# Patient Record
Sex: Female | Born: 1950 | Race: Black or African American | Hispanic: No | Marital: Single | Smoking: Former smoker
Health system: Southern US, Community
[De-identification: ages and names within clinical notes are randomized; demographics above are authoritative.]

## PROBLEM LIST (undated history)

## (undated) DIAGNOSIS — F419 Anxiety disorder, unspecified: Secondary | ICD-10-CM

## (undated) DIAGNOSIS — F329 Major depressive disorder, single episode, unspecified: Secondary | ICD-10-CM

## (undated) DIAGNOSIS — I10 Essential (primary) hypertension: Secondary | ICD-10-CM

## (undated) DIAGNOSIS — M1990 Unspecified osteoarthritis, unspecified site: Secondary | ICD-10-CM

## (undated) DIAGNOSIS — F32A Depression, unspecified: Secondary | ICD-10-CM

## (undated) DIAGNOSIS — C801 Malignant (primary) neoplasm, unspecified: Secondary | ICD-10-CM

## (undated) HISTORY — PX: TONSILLECTOMY: SUR1361

---

## 1997-11-23 ENCOUNTER — Emergency Department (HOSPITAL_COMMUNITY): Admission: EM | Admit: 1997-11-23 | Discharge: 1997-11-23 | Payer: Self-pay | Admitting: Emergency Medicine

## 1997-12-05 ENCOUNTER — Emergency Department (HOSPITAL_COMMUNITY): Admission: EM | Admit: 1997-12-05 | Discharge: 1997-12-05 | Payer: Self-pay | Admitting: Emergency Medicine

## 1998-04-16 ENCOUNTER — Encounter: Payer: Self-pay | Admitting: Emergency Medicine

## 1998-04-16 ENCOUNTER — Emergency Department (HOSPITAL_COMMUNITY): Admission: EM | Admit: 1998-04-16 | Discharge: 1998-04-16 | Payer: Self-pay | Admitting: Emergency Medicine

## 2000-03-11 ENCOUNTER — Inpatient Hospital Stay (HOSPITAL_COMMUNITY): Admission: EM | Admit: 2000-03-11 | Discharge: 2000-03-17 | Payer: Self-pay | Admitting: Psychiatry

## 2000-03-20 ENCOUNTER — Observation Stay: Admission: EM | Admit: 2000-03-20 | Discharge: 2000-03-21 | Payer: Self-pay | Admitting: Emergency Medicine

## 2001-01-08 ENCOUNTER — Other Ambulatory Visit: Admission: RE | Admit: 2001-01-08 | Discharge: 2001-01-08 | Payer: Self-pay | Admitting: Internal Medicine

## 2001-08-24 ENCOUNTER — Encounter: Payer: Self-pay | Admitting: Emergency Medicine

## 2001-08-24 ENCOUNTER — Emergency Department (HOSPITAL_COMMUNITY): Admission: EM | Admit: 2001-08-24 | Discharge: 2001-08-24 | Payer: Self-pay

## 2002-05-25 ENCOUNTER — Emergency Department (HOSPITAL_COMMUNITY): Admission: EM | Admit: 2002-05-25 | Discharge: 2002-05-25 | Payer: Self-pay | Admitting: Emergency Medicine

## 2002-10-19 ENCOUNTER — Ambulatory Visit (HOSPITAL_COMMUNITY): Admission: RE | Admit: 2002-10-19 | Discharge: 2002-10-19 | Payer: Self-pay | Admitting: Internal Medicine

## 2002-10-19 ENCOUNTER — Encounter: Payer: Self-pay | Admitting: Internal Medicine

## 2003-04-18 ENCOUNTER — Encounter: Payer: Self-pay | Admitting: Emergency Medicine

## 2003-04-18 ENCOUNTER — Emergency Department (HOSPITAL_COMMUNITY): Admission: EM | Admit: 2003-04-18 | Discharge: 2003-04-18 | Payer: Self-pay | Admitting: Emergency Medicine

## 2003-09-19 ENCOUNTER — Ambulatory Visit (HOSPITAL_COMMUNITY): Admission: RE | Admit: 2003-09-19 | Discharge: 2003-09-19 | Payer: Self-pay | Admitting: Family Medicine

## 2004-04-07 ENCOUNTER — Ambulatory Visit: Payer: Self-pay | Admitting: Nurse Practitioner

## 2004-04-15 ENCOUNTER — Ambulatory Visit: Payer: Self-pay | Admitting: *Deleted

## 2004-05-18 ENCOUNTER — Ambulatory Visit: Payer: Self-pay | Admitting: Nurse Practitioner

## 2004-08-23 ENCOUNTER — Ambulatory Visit: Payer: Self-pay | Admitting: Family Medicine

## 2004-08-31 ENCOUNTER — Emergency Department (HOSPITAL_COMMUNITY): Admission: EM | Admit: 2004-08-31 | Discharge: 2004-08-31 | Payer: Self-pay | Admitting: Emergency Medicine

## 2004-10-14 ENCOUNTER — Ambulatory Visit: Payer: Self-pay | Admitting: Nurse Practitioner

## 2005-02-02 ENCOUNTER — Ambulatory Visit: Payer: Self-pay | Admitting: Nurse Practitioner

## 2005-02-04 ENCOUNTER — Ambulatory Visit: Payer: Self-pay | Admitting: Nurse Practitioner

## 2005-03-10 ENCOUNTER — Other Ambulatory Visit: Admission: RE | Admit: 2005-03-10 | Discharge: 2005-03-10 | Payer: Self-pay | Admitting: Family Medicine

## 2005-03-10 ENCOUNTER — Ambulatory Visit: Payer: Self-pay | Admitting: Family Medicine

## 2005-03-10 ENCOUNTER — Encounter (INDEPENDENT_AMBULATORY_CARE_PROVIDER_SITE_OTHER): Payer: Self-pay | Admitting: *Deleted

## 2005-03-11 ENCOUNTER — Ambulatory Visit: Payer: Self-pay | Admitting: Nurse Practitioner

## 2006-01-30 ENCOUNTER — Ambulatory Visit: Payer: Self-pay | Admitting: Nurse Practitioner

## 2006-09-15 ENCOUNTER — Emergency Department (HOSPITAL_COMMUNITY): Admission: EM | Admit: 2006-09-15 | Discharge: 2006-09-15 | Payer: Self-pay | Admitting: Emergency Medicine

## 2006-11-23 ENCOUNTER — Ambulatory Visit: Payer: Self-pay | Admitting: Nurse Practitioner

## 2007-03-28 ENCOUNTER — Encounter (INDEPENDENT_AMBULATORY_CARE_PROVIDER_SITE_OTHER): Payer: Self-pay | Admitting: *Deleted

## 2007-08-03 ENCOUNTER — Ambulatory Visit: Payer: Self-pay | Admitting: Family Medicine

## 2007-08-06 ENCOUNTER — Ambulatory Visit: Payer: Self-pay | Admitting: Internal Medicine

## 2007-09-11 ENCOUNTER — Ambulatory Visit: Payer: Self-pay | Admitting: Internal Medicine

## 2007-09-11 LAB — CONVERTED CEMR LAB
Anti Nuclear Antibody(ANA): NEGATIVE
BUN: 14 mg/dL (ref 6–23)
Basophils Absolute: 0.1 10*3/uL (ref 0.0–0.1)
CO2: 25 meq/L (ref 19–32)
Chloride: 102 meq/L (ref 96–112)
Lymphocytes Relative: 33 % (ref 12–46)
Lymphs Abs: 3.4 10*3/uL (ref 0.7–4.0)
MCV: 79.6 fL (ref 78.0–100.0)
Monocytes Absolute: 0.9 10*3/uL (ref 0.1–1.0)
Neutro Abs: 5.6 10*3/uL (ref 1.7–7.7)
Potassium: 4.3 meq/L (ref 3.5–5.3)
RDW: 15.3 % (ref 11.5–15.5)

## 2007-10-26 ENCOUNTER — Ambulatory Visit: Payer: Self-pay | Admitting: Internal Medicine

## 2007-11-23 ENCOUNTER — Ambulatory Visit: Payer: Self-pay | Admitting: Sports Medicine

## 2007-11-23 DIAGNOSIS — IMO0002 Reserved for concepts with insufficient information to code with codable children: Secondary | ICD-10-CM | POA: Insufficient documentation

## 2007-11-23 DIAGNOSIS — M1710 Unilateral primary osteoarthritis, unspecified knee: Secondary | ICD-10-CM | POA: Insufficient documentation

## 2007-11-25 DIAGNOSIS — F329 Major depressive disorder, single episode, unspecified: Secondary | ICD-10-CM | POA: Insufficient documentation

## 2007-11-25 DIAGNOSIS — F411 Generalized anxiety disorder: Secondary | ICD-10-CM | POA: Insufficient documentation

## 2007-11-25 DIAGNOSIS — I10 Essential (primary) hypertension: Secondary | ICD-10-CM

## 2007-11-25 DIAGNOSIS — M1990 Unspecified osteoarthritis, unspecified site: Secondary | ICD-10-CM | POA: Insufficient documentation

## 2008-03-04 ENCOUNTER — Ambulatory Visit: Payer: Self-pay | Admitting: Internal Medicine

## 2008-06-26 ENCOUNTER — Ambulatory Visit: Payer: Self-pay | Admitting: Internal Medicine

## 2008-06-26 ENCOUNTER — Encounter: Payer: Self-pay | Admitting: Internal Medicine

## 2008-06-26 LAB — CONVERTED CEMR LAB: Chlamydia, DNA Probe: NEGATIVE

## 2008-07-07 ENCOUNTER — Ambulatory Visit: Payer: Self-pay | Admitting: Internal Medicine

## 2008-07-07 ENCOUNTER — Ambulatory Visit (HOSPITAL_COMMUNITY): Admission: RE | Admit: 2008-07-07 | Discharge: 2008-07-07 | Payer: Self-pay | Admitting: Family Medicine

## 2008-07-18 ENCOUNTER — Encounter: Admission: RE | Admit: 2008-07-18 | Discharge: 2008-07-18 | Payer: Self-pay | Admitting: Internal Medicine

## 2008-12-11 ENCOUNTER — Ambulatory Visit: Payer: Self-pay | Admitting: Internal Medicine

## 2008-12-11 ENCOUNTER — Encounter: Payer: Self-pay | Admitting: Internal Medicine

## 2009-01-13 ENCOUNTER — Ambulatory Visit (HOSPITAL_COMMUNITY): Admission: RE | Admit: 2009-01-13 | Discharge: 2009-01-13 | Payer: Self-pay | Admitting: Internal Medicine

## 2009-02-12 ENCOUNTER — Other Ambulatory Visit: Admission: RE | Admit: 2009-02-12 | Discharge: 2009-02-12 | Payer: Self-pay | Admitting: Obstetrics and Gynecology

## 2009-02-12 ENCOUNTER — Ambulatory Visit: Payer: Self-pay | Admitting: Obstetrics & Gynecology

## 2009-02-26 ENCOUNTER — Ambulatory Visit: Payer: Self-pay | Admitting: Family Medicine

## 2010-02-26 ENCOUNTER — Ambulatory Visit: Payer: Self-pay | Admitting: Internal Medicine

## 2010-04-15 ENCOUNTER — Encounter (INDEPENDENT_AMBULATORY_CARE_PROVIDER_SITE_OTHER): Payer: Self-pay | Admitting: Internal Medicine

## 2010-04-15 LAB — CONVERTED CEMR LAB
Chloride: 107 meq/L (ref 96–112)
Cholesterol: 156 mg/dL (ref 0–200)
Glucose, Bld: 96 mg/dL (ref 70–99)
Potassium: 5.1 meq/L (ref 3.5–5.3)
Sodium: 142 meq/L (ref 135–145)
Total CHOL/HDL Ratio: 1.6
Triglycerides: 53 mg/dL (ref <150)
VLDL: 11 mg/dL (ref 0–40)

## 2010-09-08 ENCOUNTER — Other Ambulatory Visit: Payer: Self-pay | Admitting: Internal Medicine

## 2010-10-08 ENCOUNTER — Other Ambulatory Visit: Payer: Self-pay | Admitting: Internal Medicine

## 2010-10-08 NOTE — Telephone Encounter (Signed)
Refill refused because [facility] is not an [facility] patient; she is followed at [facility]

## 2010-11-18 ENCOUNTER — Other Ambulatory Visit: Payer: Self-pay | Admitting: Internal Medicine

## 2010-11-26 NOTE — Discharge Summary (Signed)
[facility]  Patient:    PARISSA, CHIAO                     MRN: [id number] Adm. Date:  [id number] Disc. Date: [id number] Attending:  Julian Hy                           Discharge Summary  DISCHARGE DIAGNOSES: 1. Mixed overdose with alcohol and Wellbutrin. 2. Increased seizure risk due to Wellbutrin overdose, no seizures noted. 3. Ongoing alcohol abuse. 4. Major depression. 5. Labile blood pressure without specific therapy needed.  HISTORY:  Ms. Senaida Ores is a 60 year old black female who presented on March 20, 2000, after an overdose with Wellbutrin and alcohol.  She had taken approximately five Wellbutrin SR 150, and it was felt that her seizure risk was increased.  Based on this, she was admitted to the medical service unassigned for observation.  She has had no problems with seizures or seizure-like activity during her hospitalization.  She was seen in consultation by psychiatry and transferred to [facility] and has been medically stable overnight.  She has had labile blood pressure initially with it up, but it has settled down without specific therapy.  She has had no signs of alcohol withdrawal or withdrawal from other substances.  It is felt best by the psychiatry consultants that she can go home with outpatient therapy.  I will follow their lead on these issues.  LABORATORY REVIEW:  Urinalysis is negative.  Hepatic profile is entirely negative except for an SGPT of 42.  INR was normal at 1.3.  Alcohol level was 117 mg/dl.  At presentation, sodium 144, potassium 4.3, chloride 112, CO2 25, BUN 13, creatinine 1.1, calcium 9.0, glucose 80, white count 7700, hemoglobin 13.1, platelet count 263,000.  Tylenol level was less than 10.  Drug screen was positive for benzodiazepines and THC.  Blood pressure has settled down to 150/90.  She has had no fever while here.  In summary, a 60 year old black female with an overdose, as noted  above, superimposed upon a history of depression.  The patient is stabilized and is past any seizure risk.  We will look towards the consultants to the best plan of action for her.  She is going to be seen by them later today and if cleared, will be discharged to home with them to give prescriptions for the Wellbutrin.  The patient has no specific medical issues that need follow-up, assuming that she gets outpatient treatment for alcohol abuse.  She has been seen in [facility] for general medical issues, and I recommend follow-up with them in the future.  She does not have any evidence of any other ongoing medical issues. DD:  03/21/00 TD:  03/21/00 Job: [id number]
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
## 2010-11-26 NOTE — H&P (Signed)
[facility]  Patient:    Kristen Chavez, Kristen Chavez                     MRN: [id number] Adm. Date:  [id number] Attending:  Benny Lennert                         History and Physical  ADDENDUM:  PAST MEDICAL HISTORY:  History of high blood pressure, previously treated; a history of depression.  No surgical history.  No hospitalizations.  No head injuries or history of seizures.  No history of coronary disease, diabetes, breathing trouble or other problems. DD:  03/20/00 TD:  03/20/00 Job: [id number]
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
## 2011-01-10 ENCOUNTER — Other Ambulatory Visit: Payer: Self-pay | Admitting: Internal Medicine

## 2011-01-13 ENCOUNTER — Other Ambulatory Visit: Payer: Self-pay | Admitting: Internal Medicine

## 2011-04-04 ENCOUNTER — Other Ambulatory Visit: Payer: Self-pay | Admitting: Internal Medicine

## 2011-06-10 ENCOUNTER — Other Ambulatory Visit: Payer: Self-pay | Admitting: Internal Medicine

## 2011-06-10 NOTE — Telephone Encounter (Signed)
Not our patient

## 2011-06-13 ENCOUNTER — Other Ambulatory Visit: Payer: Self-pay | Admitting: Internal Medicine

## 2012-09-21 ENCOUNTER — Encounter (HOSPITAL_COMMUNITY): Payer: Self-pay

## 2012-09-21 ENCOUNTER — Emergency Department (INDEPENDENT_AMBULATORY_CARE_PROVIDER_SITE_OTHER)
Admission: EM | Admit: 2012-09-21 | Discharge: 2012-09-21 | Disposition: A | Discharge status: Home / Self Care | Payer: Self-pay | Source: Home / Self Care

## 2012-09-21 DIAGNOSIS — F411 Generalized anxiety disorder: Secondary | ICD-10-CM

## 2012-09-21 DIAGNOSIS — I10 Essential (primary) hypertension: Secondary | ICD-10-CM

## 2012-09-21 MED ORDER — HYDROXYZINE HCL 25 MG PO TABS: 25.0000 mg | ORAL_TABLET | Freq: Three times a day (TID) | ORAL | Status: DC | PRN

## 2012-09-21 MED ORDER — LISINOPRIL-HYDROCHLOROTHIAZIDE 20-25 MG PO TABS: 1.0000 | ORAL_TABLET | Freq: Every day | ORAL | Status: DC

## 2012-09-21 MED ORDER — HYDROCODONE-ACETAMINOPHEN 5-500 MG PO TABS: 1.0000 | ORAL_TABLET | Freq: Four times a day (QID) | ORAL | Status: DC | PRN

## 2012-09-21 NOTE — ED Provider Notes (Signed)
History     CSN: [id number]  Arrival date & time 09/21/12  1448   None     Chief Complaint  Patient presents with  . Hypertension    (Consider location/radiation/quality/duration/timing/severity/associated sxs/prior treatment) HPI Patient is 62 year old female who presents for followup and would like to get a refill on medicines. She reports being in usual state of health, no chest pain or shortness of breath, no abdominal or urinary concerns, no recent sicknesses or hospitalizations. Patient reports being healthy otherwise but has chronic pain from osteoarthritis in her knees. 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  No past medical history   Family history of HTN  History  Substance Use Topics  . Smoking status: Not on file  . Smokeless tobacco: Not on file  . Alcohol Use: Not on file    OB History   No data available      Review of Systems  Constitutional: Negative for fever, chills, diaphoresis, activity change, appetite change and fatigue.  HENT: Negative for ear pain, nosebleeds, congestion, facial swelling, rhinorrhea, neck pain, neck stiffness and ear discharge.   Eyes: Negative for pain, discharge, redness, itching and visual disturbance.  Respiratory: Negative for cough, choking, chest tightness, shortness of breath, wheezing and stridor.   Cardiovascular: Negative for chest pain, palpitations and leg swelling.  Gastrointestinal: Negative for abdominal distention.  Genitourinary: Negative for dysuria, urgency, frequency, hematuria, flank pain, decreased urine volume, difficulty urinating and dyspareunia.  Musculoskeletal: Negative for back pain, joint swelling, arthralgias and gait problem.  Neurological: Negative for dizziness, tremors, seizures, syncope, facial asymmetry, speech difficulty, weakness, light-headedness, numbness and headaches.  Hematological: Negative for adenopathy. Does not bruise/bleed  easily.  Psychiatric/Behavioral: Negative for hallucinations, behavioral problems, confusion, dysphoric mood, decreased concentration and agitation.    Allergies  Review of patient's allergies indicates not on file.  Home Medications  No current outpatient prescriptions on file.  There were no vitals taken for this visit.  Physical Exam Physical Exam  Constitutional: Appears well-developed and well-nourished. No distress.  HENT: Normocephalic. External right and left ear normal. Oropharynx is clear and moist.  Eyes: Conjunctivae and EOM are normal. PERRLA, no scleral icterus.  Neck: Normal ROM. Neck supple. No JVD. No tracheal deviation. No thyromegaly.  CVS: RRR, S1/S2 +, no murmurs, no gallops, no carotid bruit.  Pulmonary: Effort and breath sounds normal, no stridor, rhonchi, wheezes, rales.  Abdominal: Soft. BS +,  no distension, tenderness, rebound or guarding.  Musculoskeletal: Normal range of motion. No edema and no tenderness.  Lymphadenopathy: No lymphadenopathy noted, cervical, inguinal. Neuro: Alert. Normal reflexes, muscle tone coordination. No cranial nerve deficit. Skin: Skin is warm and dry. No rash noted. Not diaphoretic. No erythema. No pallor.  Psychiatric: Normal mood and affect. Behavior, judgment, thought content normal.    ED Course  Procedures (including critical care time)  Labs Reviewed - No data to display No results found.   Hypertension - Somewhat above the target range, we have discussed normal blood pressure range - I have advised patient to check blood pressure regularly at home and to call his back if the numbers are persistently higher than 140/90 - I will go ahead and prescribe lisinopril/HCTZ with no change in the dose as of right now Arthritis - In bilateral knees, continue pain medicine as needed   MDM  Hypertension        Dorothea Ogle, MD 09/21/12 1549

## 2012-09-21 NOTE — ED Notes (Signed)
Patient states had her blood pressure checked at [facility]  And was encouraged to come here Because her B/P 180/130

## 2013-07-19 ENCOUNTER — Encounter (HOSPITAL_COMMUNITY): Payer: Self-pay | Admitting: Emergency Medicine

## 2013-07-19 ENCOUNTER — Emergency Department (HOSPITAL_COMMUNITY)
Admission: EM | Admit: 2013-07-19 | Discharge: 2013-07-19 | Disposition: A | Discharge status: Home / Self Care | Payer: Self-pay | Attending: Emergency Medicine | Admitting: Emergency Medicine

## 2013-07-19 ENCOUNTER — Emergency Department (HOSPITAL_COMMUNITY): Payer: Self-pay

## 2013-07-19 DIAGNOSIS — Z91199 Patient's noncompliance with other medical treatment and regimen due to unspecified reason: Secondary | ICD-10-CM | POA: Insufficient documentation

## 2013-07-19 DIAGNOSIS — M25529 Pain in unspecified elbow: Secondary | ICD-10-CM | POA: Insufficient documentation

## 2013-07-19 DIAGNOSIS — M542 Cervicalgia: Secondary | ICD-10-CM | POA: Insufficient documentation

## 2013-07-19 DIAGNOSIS — R079 Chest pain, unspecified: Secondary | ICD-10-CM | POA: Insufficient documentation

## 2013-07-19 DIAGNOSIS — Z79899 Other long term (current) drug therapy: Secondary | ICD-10-CM | POA: Insufficient documentation

## 2013-07-19 DIAGNOSIS — R002 Palpitations: Secondary | ICD-10-CM | POA: Insufficient documentation

## 2013-07-19 DIAGNOSIS — Z760 Encounter for issue of repeat prescription: Secondary | ICD-10-CM | POA: Insufficient documentation

## 2013-07-19 DIAGNOSIS — R0609 Other forms of dyspnea: Secondary | ICD-10-CM | POA: Insufficient documentation

## 2013-07-19 DIAGNOSIS — F17200 Nicotine dependence, unspecified, uncomplicated: Secondary | ICD-10-CM | POA: Insufficient documentation

## 2013-07-19 DIAGNOSIS — R0989 Other specified symptoms and signs involving the circulatory and respiratory systems: Secondary | ICD-10-CM | POA: Insufficient documentation

## 2013-07-19 DIAGNOSIS — Z789 Other specified health status: Secondary | ICD-10-CM

## 2013-07-19 DIAGNOSIS — Z9119 Patient's noncompliance with other medical treatment and regimen: Secondary | ICD-10-CM | POA: Insufficient documentation

## 2013-07-19 DIAGNOSIS — I10 Essential (primary) hypertension: Secondary | ICD-10-CM | POA: Insufficient documentation

## 2013-07-19 HISTORY — DX: Essential (primary) hypertension: I10

## 2013-07-19 LAB — BASIC METABOLIC PANEL
BUN: 11 mg/dL (ref 6–23)
CHLORIDE: 103 meq/L (ref 96–112)
CO2: 25 meq/L (ref 19–32)
Calcium: 9.5 mg/dL (ref 8.4–10.5)
Creatinine, Ser: 0.74 mg/dL (ref 0.50–1.10)
GFR calc Af Amer: >90 mL/min (ref >90)
GFR calc non Af Amer: 88 mL/min — ABNORMAL LOW (ref >90)
GLUCOSE: 84 mg/dL (ref 70–99)
POTASSIUM: 3.4 meq/L — AB (ref 3.7–5.3)
SODIUM: 139 meq/L (ref 137–147)

## 2013-07-19 LAB — POCT I-STAT TROPONIN I
Troponin i, poc: 0 ng/mL (ref 0.00–0.08)
Troponin i, poc: 0 ng/mL (ref 0.00–0.08)

## 2013-07-19 LAB — CBC
HEMATOCRIT: 39.2 % (ref 36.0–46.0)
HEMOGLOBIN: 13.3 g/dL (ref 12.0–15.0)
MCH: 27.5 pg (ref 26.0–34.0)
MCHC: 33.9 g/dL (ref 30.0–36.0)
MCV: 81.2 fL (ref 78.0–100.0)
Platelets: 286 10*3/uL (ref 150–400)
RBC: 4.83 MIL/uL (ref 3.87–5.11)
RDW: 15.7 % — ABNORMAL HIGH (ref 11.5–15.5)
WBC: 7.4 10*3/uL (ref 4.0–10.5)

## 2013-07-19 MED ORDER — METOPROLOL TARTRATE 1 MG/ML IV SOLN
5.0000 mg | Freq: Once | INTRAVENOUS | Status: AC
  Administered 2013-07-19: 5 mg via INTRAVENOUS
  Filled 2013-07-19: qty 5

## 2013-07-19 MED ORDER — ASPIRIN 81 MG PO CHEW
324.0000 mg | CHEWABLE_TABLET | Freq: Once | ORAL | Status: AC
  Administered 2013-07-19: 324 mg via ORAL
  Filled 2013-07-19: qty 4

## 2013-07-19 MED ORDER — CITALOPRAM HYDROBROMIDE 40 MG PO TABS: 40.0000 mg | ORAL_TABLET | Freq: Every day | ORAL | Status: DC

## 2013-07-19 MED ORDER — LISINOPRIL-HYDROCHLOROTHIAZIDE 20-25 MG PO TABS: 1.0000 | ORAL_TABLET | Freq: Every day | ORAL | Status: DC

## 2013-07-19 NOTE — ED Provider Notes (Addendum)
CSN: [id number]     Arrival date & time 07/19/13  1054 History   First MD Initiated Contact with Patient 07/19/13 1116     Chief Complaint  Patient presents with  . Chest Pain  . Hypertension   (Consider location/radiation/quality/duration/timing/severity/associated sxs/prior Treatment) HPI 63 y.o. Female with history of hypertension and medication noncompliance presents with hypertension and chest pain for a week.  She states she has been getting chest pain for a week brought on by stress with palpitations, dyspnea, pain shooting in left elbow, neck and sometimes thumping and sharp left anterior chest pain lasting minutes.  Resolves on own occurring a couple of times per day.  States similar symptoms several years ago for which she was not evaluated and resolved on own.  She does not have a pmd and hasn't been taking antihypertensives for a couple of months.  No family history of heart disease.  Past Medical History  Diagnosis Date  . Hypertension    Past Surgical History  Procedure Laterality Date  . Tonsillectomy     No family history on file. History  Substance Use Topics  . Smoking status: Current Every Day Smoker  . Smokeless tobacco: Not on file  . Alcohol Use: Yes   OB History   Grav Para Term Preterm Abortions TAB SAB Ect Mult Living                 Review of Systems  Allergies  Review of patient's allergies indicates no known allergies.  Home Medications   Current Outpatient Rx  Name  Route  Sig  Dispense  Refill  . citalopram (CELEXA) 40 MG tablet   Oral   Take 40 mg by mouth daily.         Marland Kitchen HYDROcodone-acetaminophen (VICODIN) 5-500 MG per tablet   Oral   Take 1 tablet by mouth every 6 (six) hours as needed for pain.   65 tablet   5   . hydrOXYzine (ATARAX/VISTARIL) 25 MG tablet   Oral   Take 1 tablet (25 mg total) by mouth 3 (three) times daily as needed for itching.   90 tablet   5   . lisinopril-hydrochlorothiazide (PRINZIDE,ZESTORETIC) 20-25 MG  per tablet   Oral   Take 1 tablet by mouth daily.   30 tablet   5    BP 176/113  Pulse 76  Temp(Src) 97.9 F (36.6 C) (Oral)  Resp 16  Ht 5\' 5"  (1.651 m)  Wt 160 lb (72.576 kg)  BMI 26.63 kg/m2  SpO2 98% Physical Exam  Nursing note and vitals reviewed. Constitutional: She is oriented to person, place, and time. She appears well-developed and well-nourished.  HENT:  Head: Normocephalic and atraumatic.  Right Ear: External ear normal.  Left Ear: External ear normal.  Nose: Nose normal.  Mouth/Throat: Oropharynx is clear and moist.  Eyes: Conjunctivae and EOM are normal. Pupils are equal, round, and reactive to light.  Neck: Normal range of motion. Neck supple.  Cardiovascular: Normal rate, regular rhythm, normal heart sounds and intact distal pulses.   Pulmonary/Chest: Effort normal and breath sounds normal.  Abdominal: Soft. Bowel sounds are normal.  Musculoskeletal: Normal range of motion.  Neurological: She is alert and oriented to person, place, and time. She has normal reflexes.  Skin: Skin is warm and dry.  Psychiatric: She has a normal mood and affect. Her behavior is normal. Thought content normal.    ED Course  Procedures (including critical care time) Labs Review Labs Reviewed  CBC  BASIC METABOLIC PANEL  PRO B NATRIURETIC PEPTIDE   Imaging Review No results found.  EKG Interpretation    Date/Time:  Friday July 19 2013 11:00:19 EST Ventricular Rate:  74 PR Interval:  142 QRS Duration: 78 QT Interval:  378 QTC Calculation: 419 R Axis:   33 Text Interpretation:  Normal sinus rhythm Possible Anterior infarct , age undetermined Abnormal ECG nonspecific inferior t wave abnormality Reconfirmed by Kmya Placide MD, Consuela Widener ([id number]) on 07/19/2013 2:45:39 PM             MDM  No diagnosis found.  Patient presents with chest palpitations and hypertension secondary to noncompliance with her medications in the setting of depression and  anxiety. I do not think  it is likely that this is anginal type chest pain but she did have 2 point-of-care troponins that were both negative. Her EKG does not show anything that looks ischemic here. She has been seen by case management and had primary care followup to assist her with obtaining her medications we will fill her antihypertensive and Celexa for 30 days with refills. I discussed the findings with the patient and discussed return precautions and she voices understanding.  Patient's blood pressure decreased to systolic of 366 after IV Lopressor here. Filed Vitals:   07/19/13 1245 07/19/13 1314 07/19/13 1315 07/19/13 1350  BP: 153/104 176/94 176/94 163/91  Pulse: 55  57 68  Temp:  97.9 F (36.6 C)    TempSrc:  Oral    Resp: 14 15 17    Height:      Weight:      SpO2: 100% 100% 100% 98%    Shaune Pollack, MD 07/19/13 [place], MD 07/19/13 [phone]

## 2013-07-19 NOTE — Progress Notes (Addendum)
ED CM  Received referral from [place] on POD E regarding medication assistance and f/u care with a  PCP. Pt presented to [facility] ED with CP and hypertension. In to speak with patient. She states, she has ran out of medications and does not have a PCP. Referral has been made to [facility] to establish care has an appointment 08/26/13 for f/u. Pt needs prescriptions until f/u with PCP. Discussed with Dr. Jeanell Sparrow. A 30 day supply was written for blood pressure meds and anti-depressants. These meds are on the $4 list at [facility]. Pt states, she is able to afford the prescriptions. Pt verbalizes understanding and agrees with discharge plan teach back method used. No further ED CM needs identified.

## 2013-07-19 NOTE — ED Notes (Signed)
Pt c/o left sided chest pain with shortness of breath x 1 week. Pt went to see a Dr today and her Blood pressure was too high so she was sent to ED. Pt has BP readings of 196/122 and 192/120 while at Dr office. Pt with labored breathing, talking in short worded sentences.

## 2013-07-19 NOTE — Discharge Instructions (Signed)
Please take medications as prescribed. Return if you have any change in your chest symptoms especially pain, sweating, or weakness. These followup as arranged.

## 2013-07-19 NOTE — ED Notes (Signed)
Contacted Mariann Laster with social work about pt not being able to afford RX.

## 2013-07-19 NOTE — Discharge Planning (Signed)
Partnership for [facility] Liaison  Follow up appointment was made with the [place] center for 08/26/13 at 3:15pm to establish primary care. Patient will also be obtaining the orange card at this visit. Patient was given the application and my contact information for any questions or concerns.

## 2013-07-19 NOTE — ED Notes (Signed)
Called lab to cancel BNP per MD Ray verbal order

## 2013-08-26 ENCOUNTER — Ambulatory Visit: Payer: Self-pay

## 2013-08-26 ENCOUNTER — Ambulatory Visit: Payer: Self-pay | Attending: Internal Medicine | Admitting: Internal Medicine

## 2013-08-26 ENCOUNTER — Encounter: Payer: Self-pay | Admitting: Internal Medicine

## 2013-08-26 VITALS — BP 134/92 | HR 56 | Temp 97.8°F | Resp 17 | Wt 160.6 lb

## 2013-08-26 DIAGNOSIS — M25562 Pain in left knee: Secondary | ICD-10-CM

## 2013-08-26 DIAGNOSIS — Z1211 Encounter for screening for malignant neoplasm of colon: Secondary | ICD-10-CM

## 2013-08-26 DIAGNOSIS — F17200 Nicotine dependence, unspecified, uncomplicated: Secondary | ICD-10-CM

## 2013-08-26 DIAGNOSIS — M25569 Pain in unspecified knee: Secondary | ICD-10-CM

## 2013-08-26 DIAGNOSIS — M25561 Pain in right knee: Secondary | ICD-10-CM

## 2013-08-26 DIAGNOSIS — Z139 Encounter for screening, unspecified: Secondary | ICD-10-CM

## 2013-08-26 DIAGNOSIS — F3289 Other specified depressive episodes: Secondary | ICD-10-CM

## 2013-08-26 DIAGNOSIS — Z23 Encounter for immunization: Secondary | ICD-10-CM

## 2013-08-26 DIAGNOSIS — I10 Essential (primary) hypertension: Secondary | ICD-10-CM

## 2013-08-26 DIAGNOSIS — F329 Major depressive disorder, single episode, unspecified: Secondary | ICD-10-CM

## 2013-08-26 LAB — CBC WITH DIFFERENTIAL/PLATELET
BASOS PCT: 1 % (ref 0–1)
Basophils Absolute: 0.1 10*3/uL (ref 0.0–0.1)
EOS ABS: 0.1 10*3/uL (ref 0.0–0.7)
Eosinophils Relative: 2 % (ref 0–5)
HEMATOCRIT: 38 % (ref 36.0–46.0)
HEMOGLOBIN: 12.6 g/dL (ref 12.0–15.0)
LYMPHS ABS: 2.4 10*3/uL (ref 0.7–4.0)
Lymphocytes Relative: 35 % (ref 12–46)
MCH: 26.2 pg (ref 26.0–34.0)
MCHC: 33.2 g/dL (ref 30.0–36.0)
MCV: 79 fL (ref 78.0–100.0)
MONO ABS: 0.6 10*3/uL (ref 0.1–1.0)
MONOS PCT: 8 % (ref 3–12)
Neutro Abs: 3.7 10*3/uL (ref 1.7–7.7)
Neutrophils Relative %: 54 % (ref 43–77)
Platelets: 288 10*3/uL (ref 150–400)
RBC: 4.81 MIL/uL (ref 3.87–5.11)
RDW: 15.2 % (ref 11.5–15.5)
WBC: 6.9 10*3/uL (ref 4.0–10.5)

## 2013-08-26 LAB — COMPLETE METABOLIC PANEL WITH GFR
ALBUMIN: 4.3 g/dL (ref 3.5–5.2)
ALK PHOS: 77 U/L (ref 39–117)
ALT: 16 U/L (ref 0–35)
AST: 18 U/L (ref 0–37)
BUN: 15 mg/dL (ref 6–23)
CO2: 28 meq/L (ref 19–32)
Calcium: 9.7 mg/dL (ref 8.4–10.5)
Chloride: 102 mEq/L (ref 96–112)
Creat: 0.77 mg/dL (ref 0.50–1.10)
GFR, Est Non African American: 82 mL/min
Glucose, Bld: 96 mg/dL (ref 70–99)
Potassium: 3.8 mEq/L (ref 3.5–5.3)
Sodium: 140 mEq/L (ref 135–145)
Total Bilirubin: 0.3 mg/dL (ref 0.2–1.2)
Total Protein: 7.3 g/dL (ref 6.0–8.3)

## 2013-08-26 LAB — LIPID PANEL
Cholesterol: 163 mg/dL (ref 0–200)
HDL: 76 mg/dL (ref >39)
LDL CALC: 70 mg/dL (ref 0–99)
Total CHOL/HDL Ratio: 2.1 Ratio
Triglycerides: 84 mg/dL (ref <150)
VLDL: 17 mg/dL (ref 0–40)

## 2013-08-26 LAB — TSH: TSH: 0.463 u[IU]/mL (ref 0.350–4.500)

## 2013-08-26 NOTE — Progress Notes (Signed)
Patient here for follow up _HTN

## 2013-08-26 NOTE — Progress Notes (Signed)
Patient Demographics  Kristen Chavez, is a 63 y.o. female  QQP:[id number]  [id number]  DOB - 11-Nov-1950  CC:  Chief Complaint  Patient presents with  . Establish Care       HPI: Kristen Chavez is a 63 y.o. female here today to establish medical care. Patient has history of hypertension osteoarthritis anxiety/depression, comes today to establish medical care denies any acute symptoms, has chronic knee joint pain and takes pain medication when necessary. Patient has No headache, No chest pain, No abdominal pain - No Nausea, No new weakness tingling or numbness, No Cough - SOB.  No Known Allergies Past Medical History  Diagnosis Date  . Hypertension    Current Outpatient Prescriptions on File Prior to Visit  Medication Sig Dispense Refill  . citalopram (CELEXA) 40 MG tablet Take 1 tablet (40 mg total) by mouth daily.  30 tablet  2  . lisinopril-hydrochlorothiazide (PRINZIDE,ZESTORETIC) 20-25 MG per tablet Take 1 tablet by mouth daily.  30 tablet  5  . Multiple Vitamin (MULTIVITAMIN WITH MINERALS) TABS tablet Take 1 tablet by mouth daily.       No current facility-administered medications on file prior to visit.   Family History  Problem Relation Age of Onset  . Hypertension Mother   . Diabetes Paternal Grandmother   . Diabetes Paternal Grandfather    History   Social History  . Marital Status: Single    Spouse Name: N/A    Number of Children: N/A  . Years of Education: N/A   Occupational History  . Not on file.   Social History Main Topics  . Smoking status: Current Every Day Smoker  . Smokeless tobacco: Not on file  . Alcohol Use: Yes     Comment: socially   . Drug Use: No  . Sexual Activity: Not on file   Other Topics Concern  . Not on file   Social History Narrative  . No narrative on file    Review of Systems: Constitutional: Negative for fever, chills, diaphoresis, activity change, appetite change and fatigue. HENT: Negative for ear pain,  nosebleeds, congestion, facial swelling, rhinorrhea, neck pain, neck stiffness and ear discharge.  Eyes: Negative for pain, discharge, redness, itching and visual disturbance. Respiratory: Negative for cough, choking, chest tightness, shortness of breath, wheezing and stridor.  Cardiovascular: Negative for chest pain, palpitations and leg swelling. Gastrointestinal: Negative for abdominal distention. Genitourinary: Negative for dysuria, urgency, frequency, hematuria, flank pain, decreased urine volume, difficulty urinating and dyspareunia.  Musculoskeletal: Negative for back pain, joint swelling, +arthralgia and gait problem. Neurological: Negative for dizziness, tremors, seizures, syncope, facial asymmetry, speech difficulty, weakness, light-headedness, numbness and headaches.  Hematological: Negative for adenopathy. Does not bruise/bleed easily. Psychiatric/Behavioral: Negative for hallucinations, behavioral problems, confusion, dysphoric mood, decreased concentration and agitation.    Objective:   Filed Vitals:   08/26/13 1145  BP: 134/92  Pulse: 56  Temp: 97.8 F (36.6 C)  Resp: 17    Physical Exam: Constitutional: Patient appears well-developed and well-nourished. No distress. HENT: Normocephalic, atraumatic, External right and left ear normal. Oropharynx is clear and moist.  Eyes: Conjunctivae and EOM are normal. PERRLA, no scleral icterus. Neck: Normal ROM. Neck supple. No JVD. No tracheal deviation. No thyromegaly. CVS: RRR, S1/S2 +, no murmurs, no gallops, no carotid bruit.  Pulmonary: Effort and breath sounds normal, no stridor, rhonchi, wheezes, rales.  Abdominal: Soft. BS +, no distension, tenderness, rebound or guarding.  Musculoskeletal: Normal range of motion. Knees crepitation+.  Neuro: Alert.  Normal reflexes, muscle tone coordination. No cranial nerve deficit. Skin: Skin is warm and dry. No rash noted. Not diaphoretic. No erythema. No pallor. Psychiatric: Normal  mood and affect. Behavior, judgment, thought content normal.  Lab Results  Component Value Date   WBC 7.4 07/19/2013   HGB 13.3 07/19/2013   HCT 39.2 07/19/2013   MCV 81.2 07/19/2013   PLT 286 07/19/2013   Lab Results  Component Value Date   CREATININE 0.74 07/19/2013   BUN 11 07/19/2013   NA 139 07/19/2013   K 3.4* 07/19/2013   CL 103 07/19/2013   CO2 25 07/19/2013    No results found for this basename: HGBA1C   Lipid Panel     Component Value Date/Time   CHOL 156 04/15/2010 2018   TRIG 53 04/15/2010 2018   HDL 95 04/15/2010 2018   CHOLHDL 1.6 Ratio 04/15/2010 2018   VLDL 11 04/15/2010 2018   LDLCALC 50 04/15/2010 2018       Assessment and plan:   1. HYPERTENSION Controlled, advised patient to continue with current medications, also advise for low salt diet.  2. DEPRESSION Symptoms are stable continue with Celexa 40 mg daily  3. Screening Baseline blood work  - CBC with Differential - COMPLETE METABOLIC PANEL WITH GFR - TSH - Lipid panel - Vit D  25 hydroxy (rtn osteoporosis monitoring) - MM DIGITAL SCREENING BILATERAL; Future  4. Needs flu shot Flu shot given today  5. Special screening for malignant neoplasms, colon Referred to GI for screening colonoscopy - Ambulatory referral to Gastroenterology  6. Smoking Advise patient to quit smoking  7. Bilateral knee pain Referred to Dr., Pain medication when necessary  - Ambulatory referral to Orthopedic Surgery        Health Maintenance -Colonoscopy: Referral done -Pap Smear: Will be scheduled for Pap smear. -Mammogram: Mammogram ordered -Vaccinations:  Flu shot given today.  Return in about 6 weeks (around 10/07/2013).    The patient was given clear instructions to go to ER or return to medical center if symptoms don't improve, worsen or new problems develop. The patient verbalized understanding. The patient was told to call to get lab results if they haven't heard anything in the next week.     Kristen Marek,  MD

## 2013-08-26 NOTE — Patient Instructions (Signed)
DASH Diet  The DASH diet stands for "Dietary Approaches to Stop Hypertension." It is a healthy eating plan that has been shown to reduce high blood pressure (hypertension) in as little as 14 days, while also possibly providing other significant health benefits. These other health benefits include reducing the risk of breast cancer after menopause and reducing the risk of type 2 diabetes, heart disease, colon cancer, and stroke. Health benefits also include weight loss and slowing kidney failure in patients with chronic kidney disease.   DIET GUIDELINES  · Limit salt (sodium). Your diet should contain less than 1500 mg of sodium daily.  · Limit refined or processed carbohydrates. Your diet should include mostly whole grains. Desserts and added sugars should be used sparingly.  · Include small amounts of heart-healthy fats. These types of fats include nuts, oils, and tub margarine. Limit saturated and trans fats. These fats have been shown to be harmful in the body.  CHOOSING FOODS   The following food groups are based on a 2000 calorie diet. See your Registered Dietitian for individual calorie needs.  Grains and Grain Products (6 to 8 servings daily)  · Eat More Often: Whole-wheat bread, brown rice, whole-grain or wheat pasta, quinoa, popcorn without added fat or salt (air popped).  · Eat Less Often: White bread, white pasta, white rice, cornbread.  Vegetables (4 to 5 servings daily)  · Eat More Often: Fresh, frozen, and canned vegetables. Vegetables may be raw, steamed, roasted, or grilled with a minimal amount of fat.  · Eat Less Often/Avoid: Creamed or fried vegetables. Vegetables in a cheese sauce.  Fruit (4 to 5 servings daily)  · Eat More Often: All fresh, canned (in natural juice), or frozen fruits. Dried fruits without added sugar. One hundred percent fruit juice (½ cup [237 mL] daily).  · Eat Less Often: Dried fruits with added sugar. Canned fruit in light or heavy syrup.  Lean Meats, Fish, and Poultry (2  servings or less daily. One serving is 3 to 4 oz [85-114 g]).  · Eat More Often: Ninety percent or leaner ground beef, tenderloin, sirloin. Round cuts of beef, chicken breast, turkey breast. All fish. Grill, bake, or broil your meat. Nothing should be fried.  · Eat Less Often/Avoid: Fatty cuts of meat, turkey, or chicken leg, thigh, or wing. Fried cuts of meat or fish.  Dairy (2 to 3 servings)  · Eat More Often: Low-fat or fat-free milk, low-fat plain or light yogurt, reduced-fat or part-skim cheese.  · Eat Less Often/Avoid: Milk (whole, 2%). Whole milk yogurt. Full-fat cheeses.  Nuts, Seeds, and Legumes (4 to 5 servings per week)  · Eat More Often: All without added salt.  · Eat Less Often/Avoid: Salted nuts and seeds, canned beans with added salt.  Fats and Sweets (limited)  · Eat More Often: Vegetable oils, tub margarines without trans fats, sugar-free gelatin. Mayonnaise and salad dressings.  · Eat Less Often/Avoid: Coconut oils, palm oils, butter, stick margarine, cream, half and half, cookies, candy, pie.  FOR MORE INFORMATION  The Dash Diet Eating Plan: www.dashdiet.org  Document Released: 06/16/2011 Document Revised: 09/19/2011 Document Reviewed: 06/16/2011  ExitCare® Patient Information ©2014 [employer].

## 2013-08-27 LAB — VITAMIN D 25 HYDROXY (VIT D DEFICIENCY, FRACTURES): Vit D, 25-Hydroxy: 37 ng/mL (ref 30–89)

## 2013-08-28 ENCOUNTER — Telehealth: Payer: Self-pay | Admitting: Internal Medicine

## 2013-08-28 ENCOUNTER — Telehealth: Payer: Self-pay

## 2013-08-28 NOTE — Telephone Encounter (Signed)
Patient is aware of her lab results 

## 2013-08-28 NOTE — Telephone Encounter (Signed)
Message copied by Dorothe Pea on Wed Aug 28, 2013 11:58 AM ------      Message from: [name]      Created: Wed Aug 28, 2013 10:09 AM       Blood work reviewed, call and inform patient that its normal. ------

## 2013-08-28 NOTE — Telephone Encounter (Signed)
Patient would like her lab results. She was here on Monday.

## 2013-09-09 ENCOUNTER — Ambulatory Visit: Payer: Self-pay

## 2013-09-12 ENCOUNTER — Ambulatory Visit: Payer: Self-pay

## 2013-09-19 ENCOUNTER — Ambulatory Visit: Payer: Self-pay

## 2013-10-09 ENCOUNTER — Ambulatory Visit: Payer: Self-pay | Admitting: Internal Medicine

## 2013-10-24 ENCOUNTER — Telehealth: Payer: Self-pay | Admitting: Internal Medicine

## 2013-10-24 NOTE — Telephone Encounter (Signed)
Pt calling for refill for citalopram (CELEXA) 40 MG tablet and hydrocholorothiazide.  Please f/u with pt, she uses [facility] [address].

## 2013-10-25 ENCOUNTER — Other Ambulatory Visit: Payer: Self-pay

## 2013-10-25 ENCOUNTER — Telehealth: Payer: Self-pay

## 2013-10-25 DIAGNOSIS — Z0000 Encounter for general adult medical examination without abnormal findings: Secondary | ICD-10-CM

## 2013-10-25 MED ORDER — CITALOPRAM HYDROBROMIDE 40 MG PO TABS: 40.0000 mg | ORAL_TABLET | Freq: Every day | ORAL | Status: DC

## 2013-10-30 MED ORDER — LISINOPRIL-HYDROCHLOROTHIAZIDE 20-25 MG PO TABS: 1.0000 | ORAL_TABLET | Freq: Every day | ORAL | Status: DC

## 2013-10-30 NOTE — Telephone Encounter (Signed)
Patient not available Left message on voice mail that her Prescriptions were sent to the [facility] at [place]

## 2013-11-07 ENCOUNTER — Ambulatory Visit (INDEPENDENT_AMBULATORY_CARE_PROVIDER_SITE_OTHER): Payer: Self-pay | Admitting: Family Medicine

## 2013-11-07 ENCOUNTER — Other Ambulatory Visit (HOSPITAL_COMMUNITY)
Admission: RE | Admit: 2013-11-07 | Discharge: 2013-11-07 | Disposition: A | Discharge status: Home / Self Care | Payer: Self-pay | Source: Ambulatory Visit | Attending: Family Medicine | Admitting: Family Medicine

## 2013-11-07 VITALS — BP 105/72 | HR 79 | Temp 98.7°F | Wt 157.6 lb

## 2013-11-07 DIAGNOSIS — Z113 Encounter for screening for infections with a predominantly sexual mode of transmission: Secondary | ICD-10-CM | POA: Insufficient documentation

## 2013-11-07 DIAGNOSIS — Z01419 Encounter for gynecological examination (general) (routine) without abnormal findings: Secondary | ICD-10-CM | POA: Insufficient documentation

## 2013-11-07 DIAGNOSIS — Z202 Contact with and (suspected) exposure to infections with a predominantly sexual mode of transmission: Secondary | ICD-10-CM

## 2013-11-07 DIAGNOSIS — Z1151 Encounter for screening for human papillomavirus (HPV): Secondary | ICD-10-CM | POA: Insufficient documentation

## 2013-11-07 DIAGNOSIS — Z124 Encounter for screening for malignant neoplasm of cervix: Secondary | ICD-10-CM

## 2013-11-07 LAB — RPR

## 2013-11-07 LAB — HIV ANTIBODY (ROUTINE TESTING W REFLEX): HIV: NONREACTIVE

## 2013-11-07 NOTE — Addendum Note (Signed)
Addended by: Derl Barrow on: 11/07/2013 11:04 AM   Modules accepted: Orders

## 2013-11-07 NOTE — Progress Notes (Signed)
Patient ID: Kristen Chavez, female   DOB: Feb 12, 1951, 63 y.o.   MRN: [id number] Visit for pap smear/ well woman GYN.  Patient   none   having periods They are   lmp 24 y ago She   is  Currently sexually active with men. Recent partner dx HIV Contraceptive needs: no Pelvic issues such as pain, discharge, abnormal bleeding? no Smoker?   Yes 50 pky Any pertinent FH of cervical or uterine cancer, breast cancer? no Any particular pertinent personal medical history?  Recent possible exposure to HIV   GU externally normal. Normal bimanual exam without adnexal masses A: well woman gyn recebt exposure to STD (partner dx HIV) PLAN: pap smear and std checks today,

## 2013-11-08 ENCOUNTER — Telehealth: Payer: Self-pay | Admitting: Internal Medicine

## 2013-11-08 LAB — CERVICOVAGINAL ANCILLARY ONLY
Chlamydia: NEGATIVE
NEISSERIA GONORRHEA: NEGATIVE

## 2013-11-08 NOTE — Telephone Encounter (Signed)
Please call patient back regarding results from Pap

## 2013-11-08 NOTE — Telephone Encounter (Signed)
Please advise.thank you.Kristen Chavez  

## 2013-11-11 ENCOUNTER — Telehealth: Payer: Self-pay | Admitting: Internal Medicine

## 2013-11-11 NOTE — Telephone Encounter (Signed)
Left message on voicemail.that all labs were negative, once the last test come's back we would mail her  a letter with all labs attached.Kristen Chavez

## 2013-11-11 NOTE — Telephone Encounter (Signed)
Please advise.Thank you.[place]

## 2013-11-11 NOTE — Telephone Encounter (Signed)
Pt called and would like her lab results from the [facility]. jw

## 2013-11-11 NOTE — Telephone Encounter (Signed)
Dear Dema Severin Team Her HIV, GC, Chlamydia and RPR all negative. Her pap is not back yet. I will send her a letter when I get it [facility]! Dickie La

## 2013-11-14 ENCOUNTER — Encounter: Payer: Self-pay | Admitting: Family Medicine

## 2014-02-04 ENCOUNTER — Telehealth: Payer: Self-pay | Admitting: Internal Medicine

## 2014-02-04 NOTE — Telephone Encounter (Signed)
Pt. Needs refill on Celexa, please call patient if medication can be filled...sent to preferred pharmacy

## 2014-02-05 ENCOUNTER — Other Ambulatory Visit: Payer: Self-pay | Admitting: *Deleted

## 2014-02-05 MED ORDER — CITALOPRAM HYDROBROMIDE 40 MG PO TABS: 40.0000 mg | ORAL_TABLET | Freq: Every day | ORAL | Status: DC

## 2014-02-06 ENCOUNTER — Telehealth: Payer: Self-pay

## 2014-02-06 NOTE — Telephone Encounter (Signed)
Left message on machine Medications refilled and sent to the pharmacy on file

## 2014-04-28 ENCOUNTER — Encounter (HOSPITAL_COMMUNITY): Payer: Self-pay | Admitting: Emergency Medicine

## 2014-04-28 ENCOUNTER — Emergency Department (INDEPENDENT_AMBULATORY_CARE_PROVIDER_SITE_OTHER)
Admission: EM | Admit: 2014-04-28 | Discharge: 2014-04-28 | Disposition: A | Discharge status: Home / Self Care | Payer: Medicare Other | Source: Home / Self Care

## 2014-04-28 DIAGNOSIS — G8929 Other chronic pain: Secondary | ICD-10-CM

## 2014-04-28 DIAGNOSIS — M1712 Unilateral primary osteoarthritis, left knee: Secondary | ICD-10-CM | POA: Diagnosis not present

## 2014-04-28 DIAGNOSIS — M25562 Pain in left knee: Secondary | ICD-10-CM

## 2014-04-28 DIAGNOSIS — M25462 Effusion, left knee: Secondary | ICD-10-CM

## 2014-04-28 HISTORY — DX: Unspecified osteoarthritis, unspecified site: M19.90

## 2014-04-28 LAB — SYNOVIAL CELL COUNT + DIFF, W/ CRYSTALS
CRYSTALS FLUID: NONE SEEN
Eosinophils-Synovial: 2 % — ABNORMAL HIGH (ref 0–1)
Lymphocytes-Synovial Fld: 17 % (ref 0–20)
MONOCYTE-MACROPHAGE-SYNOVIAL FLUID: 68 % (ref 50–90)
Neutrophil, Synovial: 13 % (ref 0–25)
WBC, Synovial: 96 /mm3 (ref 0–200)

## 2014-04-28 MED ORDER — TRAMADOL HCL 50 MG PO TABS: 50.0000 mg | ORAL_TABLET | Freq: Four times a day (QID) | ORAL | Status: DC | PRN

## 2014-04-28 MED ORDER — TRIAMCINOLONE ACETONIDE 40 MG/ML IJ SUSP
INTRAMUSCULAR | Status: AC
  Filled 2014-04-28: qty 2

## 2014-04-28 MED ORDER — NAPROXEN 375 MG PO TABS: 375.0000 mg | ORAL_TABLET | Freq: Two times a day (BID) | ORAL | Status: DC

## 2014-04-28 MED ORDER — LIDOCAINE HCL (PF) 1 % IJ SOLN
INTRAMUSCULAR | Status: AC
  Filled 2014-04-28: qty 10

## 2014-04-28 NOTE — ED Provider Notes (Signed)
CSN: [id number]     Arrival date & time 04/28/14  1616 History   First MD Initiated Contact with Patient 04/28/14 1628     Chief Complaint  Patient presents with  . Joint Swelling   (Consider location/radiation/quality/duration/timing/severity/associated sxs/prior Treatment) HPI Comments: Acute on chronic left knee pain for the past week. Has been dx with severe left knee DJD and was referred to Ortho per documentation from [facility] but pt unaware of this. St this week left knee pain worsening and with swelling.    Past Medical History  Diagnosis Date  . Hypertension    Past Surgical History  Procedure Laterality Date  . Tonsillectomy     Family History  Problem Relation Age of Onset  . Hypertension Mother   . Diabetes Paternal Grandmother   . Diabetes Paternal Grandfather    History  Substance Use Topics  . Smoking status: Current Every Day Smoker  . Smokeless tobacco: Not on file  . Alcohol Use: Yes     Comment: socially    OB History   Grav Para Term Preterm Abortions TAB SAB Ect Mult Living                 Review of Systems  Constitutional: Negative for fever, chills and activity change.  Respiratory: Negative for shortness of breath.   Cardiovascular: Negative.   Musculoskeletal: Positive for joint swelling. Negative for back pain, myalgias and neck pain.       As per HPI  Skin: Negative for color change, pallor and rash.  Neurological: Negative.     Allergies  Review of patient's allergies indicates no known allergies.  Home Medications   Prior to Admission medications   Medication Sig Start Date End Date Taking? Authorizing Provider  citalopram (CELEXA) 40 MG tablet Take 1 tablet (40 mg total) by mouth daily. 02/05/14   [name], MD  lisinopril-hydrochlorothiazide (PRINZIDE,ZESTORETIC) 20-25 MG per tablet Take 1 tablet by mouth daily. 10/30/13   [name], MD  Multiple Vitamin (MULTIVITAMIN WITH MINERALS) TABS tablet Take 1 tablet by  mouth daily.    Historical Provider, MD  naproxen (NAPROSYN) 375 MG tablet Take 1 tablet (375 mg total) by mouth 2 (two) times daily. 04/28/14   Janne Napoleon, NP  traMADol (ULTRAM) 50 MG tablet Take 1 tablet (50 mg total) by mouth every 6 (six) hours as needed. 04/28/14   Janne Napoleon, NP   BP 158/101  Pulse 76  Temp(Src) 99 F (37.2 C) (Oral)  Resp 16  Ht 5\' 5"  (1.651 m)  Wt 153 lb (69.4 kg)  BMI 25.46 kg/m2  SpO2 98% Physical Exam  Nursing note and vitals reviewed. Constitutional: She is oriented to person, place, and time. She appears well-developed and well-nourished. No distress.  HENT:  Head: Normocephalic and atraumatic.  Eyes: EOM are normal.  Neck: Normal range of motion. Neck supple.  Pulmonary/Chest: Effort normal. No respiratory distress.  Musculoskeletal:  Left knee extension to 180 deg Flexion to 110 deg. Swelling observed to the left knee primarily medial aspect. Mild surrounding bony tenderness. Crepitus palpated with flex/ext. No discoloration, redness, increase warmth, lymphangitis or other signs of infection.   Lymphadenopathy:    She has no cervical adenopathy.  Neurological: She is alert and oriented to person, place, and time. No cranial nerve deficit.  Skin: Skin is warm and dry.  Psychiatric: She has a normal mood and affect.    ED Course  ARTHOCENTESIS Date/Time: 04/28/2014 5:00 PM Performed by: Janne Napoleon Authorized  by: Lynne Leader, S Consent: Verbal consent obtained. Risks and benefits: risks, benefits and alternatives were discussed Patient understanding: patient states understanding of the procedure being performed Patient identity confirmed: verbally with patient Indications: joint swelling and pain  Body area: knee Joint: left knee Local anesthesia used: yes Anesthesia: local infiltration Local anesthetic: lidocaine 2% without epinephrine Anesthetic total: 3 ml Patient sedated: no Preparation: Patient was prepped and draped in the usual  sterile fashion. Needle gauge: 18 G Ultrasound guidance: no Approach: medial Aspirate: blood-tinged and yellow Aspirate amount: 40 ml Triamcinolone amount: 60 mg Lidocaine 1% amount: 7 ml Patient tolerance: Patient tolerated the procedure well with no immediate complications.   (including critical care time) Labs Review Labs Reviewed - No data to display  Imaging Review No results found.   MDM   1. Primary osteoarthritis of left knee   2. Knee effusion, left   3. Knee pain, chronic, left    Naprosyn bid Tramadol 50 MG #15 Left knee aspiration and joint injection lidocaine and Kenalog 60 mg  F/U with PCP and need referral to ortho  Janne Napoleon, NP 04/28/14 1732

## 2014-04-28 NOTE — Discharge Instructions (Signed)
Knee Effusion The medical term for having fluid in your knee is effusion. This is often due to an internal derangement of the knee. This means something is wrong inside the knee. Some of the causes of fluid in the knee may be torn cartilage, a torn ligament, or bleeding into the joint from an injury. Your knee is likely more difficult to bend and move. This is often because there is increased pain and pressure in the joint. The time it takes for recovery from a knee effusion depends on different factors, including:   Type of injury.  Your age.  Physical and medical conditions.  Rehabilitation Strategies. How long you will be away from your normal activities will depend on what kind of knee problem you have and how much damage is present. Your knee has two types of cartilage. Articular cartilage covers the bone ends and lets your knee bend and move smoothly. Two menisci, thick pads of cartilage that form a rim inside the joint, help absorb shock and stabilize your knee. Ligaments bind the bones together and support your knee joint. Muscles move the joint, help support your knee, and take stress off the joint itself. CAUSES  Often an effusion in the knee is caused by an injury to one of the menisci. This is often a tear in the cartilage. Recovery after a meniscus injury depends on how much meniscus is damaged and whether you have damaged other knee tissue. Small tears may heal on their own with conservative treatment. Conservative means rest, limited weight bearing activity and muscle strengthening exercises. Your recovery may take up to 6 weeks.  TREATMENT  Larger tears may require surgery. Meniscus injuries may be treated during arthroscopy. Arthroscopy is a procedure in which your surgeon uses a small telescope like instrument to look in your knee. Your caregiver can make a more accurate diagnosis (learning what is wrong) by performing an arthroscopic procedure. If your injury is on the inner margin  of the meniscus, your surgeon may trim the meniscus back to a smooth rim. In other cases your surgeon will try to repair a damaged meniscus with stitches (sutures). This may make rehabilitation take longer, but may provide better long term result by helping your knee keep its shock absorption capabilities. Ligaments which are completely torn usually require surgery for repair. HOME CARE INSTRUCTIONS  Use crutches as instructed.  If a brace is applied, use as directed.  Once you are home, an ice pack applied to your swollen knee may help with discomfort and help decrease swelling.  Keep your knee raised (elevated) when you are not up and around or on crutches.  Only take over-the-counter or prescription medicines for pain, discomfort, or fever as directed by your caregiver.  Your caregivers will help with instructions for rehabilitation of your knee. This often includes strengthening exercises.  You may resume a normal diet and activities as directed. SEEK MEDICAL CARE IF:   There is increased swelling in your knee.  You notice redness, swelling, or increasing pain in your knee.  An unexplained oral temperature above 102 F (38.9 C) develops. SEEK IMMEDIATE MEDICAL CARE IF:   You develop a rash.  You have difficulty breathing.  You have any allergic reactions from medications you may have been given.  There is severe pain with any motion of the knee. MAKE SURE YOU:   Understand these instructions.  Will watch your condition.  Will get help right away if you are not doing well or get worse.  Document Released: 09/17/2003 Document Revised: 09/19/2011 Document Reviewed: 11/21/2007 [facility] Patient Information 2015 [place], [place]. This information is not intended to replace advice given to you by your health care provider. Make sure you discuss any questions you have with your health care provider.  Knee Arthrocentesis Arthrocentesis is a procedure for removing fluid from a  joint. The procedure is used to remove uncomfortable amounts of fluid from a joint or to get a sample of joint fluid for testing. Testing joint fluid can help your health care provider figure out the cause of the pain or swelling you are having in your joint. Infection or gout, among other conditions, can cause fluid to form in joints, resulting in pain or swelling.  LET YOUR CAREGIVER KNOW ABOUT:   Allergies.  Medications taken including herbs, eye drops, over the counter medications, and creams.  Use of steroids (by mouth or creams).  Previous problems with anesthetics or Novocaine.  History of blood clots (thrombophlebitis).  History of bleeding or blood problems.  Previous surgery.  Other health problems. RISKS AND COMPLICATIONS  A local anesthetic may not numb the area well enough and you may feel some minor discomfort. In rare cases, you may have an allergic reaction to the drug used to numb the skin.  More fluid may form in the joint.  You may develop infection or bleeding. BEFORE THE PROCEDURE  Wash all of the skin around the entire knee area. Try to remove any loose, scaling skin. There is no other specific preparation necessary unless advised otherwise by your caregiver. AFTER THE PROCEDURE   You can go home after the procedure.  You may need to put ice on the joint 15-20 minutes, 03-04 times a day until pain goes away.  You may need to put an elastic bandage on the joint. HOME CARE INSTRUCTIONS   Only take over-the-counter or prescription medicines for pain, discomfort, or fever as directed by your caregiver.  You should avoid stressing the joint. Unless advised otherwise, this includes jogging, bicycling, recreational climbing, hiking, and other activities that would put a lot of pressure on a knee joint.  Laying down and elevating the leg/knee above the level of your heart can help to minimize return of swelling. SEEK MEDICAL CARE IF:   You have repeated or  worsening swelling.  There is drainage from the puncture area.  You develop red streaking that extends above or below the site where the needle had been placed. SEEK IMMEDIATE MEDICAL CARE IF:   You develop a fever.  You have pain that gets worse even though you are taking pain medicine.  The area is red and warm and you have trouble moving the joint. MAKE SURE YOU:   Understand these instructions.  Will watch your condition.  Will get help right away if you are not doing well or get worse. Document Released: 09/15/2006 Document Revised: 09/19/2011 Document Reviewed: 06/11/2007 [facility] Patient Information 2015 [place], [place]. This information is not intended to replace advice given to you by your health care provider. Make sure you discuss any questions you have with your health care provider.  Knee, Cartilage and its Function The menisci are made of tough cartilage, and fit between the surfaces of the bones upon which they rest. The menisci are semi lunar (C-shaped) and have a wedged profile. The wedged profile helps the stability of the joint by keeping the rounded femur surface from sliding off the flat tibial surface. The menisci are nourished (fed) by small blood vessels;  but there is also a large area in the center of the meniscus that does not have a good blood supply (avascular). This presents a problem when there is an injury to the meniscus, as areas without good blood supply heal poorly. When there is a torn cartilage in the knee, surgery is often required to fix this. This is usually done with an arthroscopy (a surgical procedure less invasive than open surgery). Some times open surgery of the knee is required if there is other damage which has occurred. The medial meniscus rests on the medial tibial plateau. The tibia is the large bone in your lower leg (the shin bone). The medial tibial plateau is the upper end of the bone making up the inner part of your knee. The lateral  meniscus serves the same purpose and is located on the outside of the knee. The menisci help to distribute your body weight across the knee joint. Without the meniscus present, the weight of your body would be unevenly applied to the bones in your legs (the femur and tibia). The femur is the large bone in your thigh. This uneven weight distribution would cause increased wear and tear on the cartilage covering the bones, leading to early damage of these areas. The presence of the menisci cartilage is necessary for a healthy knee. PURPOSE OF THE KNEE CARTILAGE The knee joint is made up of three bones: the thigh bone (femur), the shin bone (tibia), and the knee cap (patella). The surfaces of these bones at the knee joint are covered with cartilage. This smooth, slippery surface allows the bones to slide against each other without causing bone damage. The meniscus sits between these cartilaginous surfaces of the bones (femur and tibia). It distributes the weight evenly in the joints and helps with the stability of the joint (keeps the joint steady). HOME CARE INSTRUCTIONS After surgery:  Use crutches as instructed.  Once home, an ice pack applied to your injury may help with discomfort and keep the swelling down. An ice pack can be used for 15-20 minutes 03-04 times per day for the first 2-3 days or as instructed.  Only take over-the-counter or prescription medicines for pain, discomfort, or fever as directed by your caregiver.  Call if you do not have relief of pain with medications or if there is an increase in pain.  Call if your foot becomes cold or blue.  Call if your knee gets stuck in a fixed position and will not move.  You may resume normal diet and activities as directed.  Make sure to keep your appointment with your follow-up caregiver. This injury may require further evaluation and treatment beyond the treatment you received today. MAKE SURE YOU:   Understand these  instructions.  Will watch your condition.  Will get help right away if you are not doing well or get worse. Document Released: 12/17/2001 Document Revised: 09/19/2011 Document Reviewed: 04/29/2009 [facility] Patient Information 2015 [place], [place]. This information is not intended to replace advice given to you by your health care provider. Make sure you discuss any questions you have with your health care provider.  Osteoarthritis Osteoarthritis is a disease that causes soreness and inflammation of a joint. It occurs when the cartilage at the affected joint wears down. Cartilage acts as a cushion, covering the ends of bones where they meet to form a joint. Osteoarthritis is the most common form of arthritis. It often occurs in older people. The joints affected most often by this condition include  those in the:  Ends of the fingers.  Thumbs.  Neck.  Lower back.  Knees.  Hips. CAUSES  Over time, the cartilage that covers the ends of bones begins to wear away. This causes bone to rub on bone, producing pain and stiffness in the affected joints.  RISK FACTORS Certain factors can increase your chances of having osteoarthritis, including:  Older age.  Excessive body weight.  Overuse of joints.  Previous joint injury. SIGNS AND SYMPTOMS   Pain, swelling, and stiffness in the joint.  Over time, the joint may lose its normal shape.  Small deposits of bone (osteophytes) may grow on the edges of the joint.  Bits of bone or cartilage can break off and float inside the joint space. This may cause more pain and damage. DIAGNOSIS  Your health care provider will do a physical exam and ask about your symptoms. Various tests may be ordered, such as:  X-rays of the affected joint.  An MRI scan.  Blood tests to rule out other types of arthritis.  Joint fluid tests. This involves using a needle to draw fluid from the joint and examining the fluid under a microscope. TREATMENT  Goals  of treatment are to control pain and improve joint function. Treatment plans may include:  A prescribed exercise program that allows for rest and joint relief.  A weight control plan.  Pain relief techniques, such as:  Properly applied heat and cold.  Electric pulses delivered to nerve endings under the skin (transcutaneous electrical nerve stimulation [TENS]).  Massage.  Certain nutritional supplements.  Medicines to control pain, such as:  Acetaminophen.  Nonsteroidal anti-inflammatory drugs (NSAIDs), such as naproxen.  Narcotic or central-acting agents, such as tramadol.  Corticosteroids. These can be given orally or as an injection.  Surgery to reposition the bones and relieve pain (osteotomy) or to remove loose pieces of bone and cartilage. Joint replacement may be needed in advanced states of osteoarthritis. HOME CARE INSTRUCTIONS   Take medicines only as directed by your health care provider.  Maintain a healthy weight. Follow your health care provider's instructions for weight control. This may include dietary instructions.  Exercise as directed. Your health care provider can recommend specific types of exercise. These may include:  Strengthening exercises. These are done to strengthen the muscles that support joints affected by arthritis. They can be performed with weights or with exercise bands to add resistance.  Aerobic activities. These are exercises, such as brisk walking or low-impact aerobics, that get your heart pumping.  Range-of-motion activities. These keep your joints limber.  Balance and agility exercises. These help you maintain daily living skills.  Rest your affected joints as directed by your health care provider.  Keep all follow-up visits as directed by your health care provider. SEEK MEDICAL CARE IF:   Your skin turns red.  You develop a rash in addition to your joint pain.  You have worsening joint pain.  You have a fever along with  joint or muscle aches. SEEK IMMEDIATE MEDICAL CARE IF:  You have a significant loss of weight or appetite.  You have night sweats. [place] of Arthritis and Musculoskeletal and Skin Diseases: www.niams.SouthExposed.es  [employer] on Aging: http://kim-miller.com/  American College of Rheumatology: www.rheumatology.org Document Released: 06/27/2005 Document Revised: 11/11/2013 Document Reviewed: 03/04/2013 [facility] Patient Information 2015 [place], [place]. This information is not intended to replace advice given to you by your health care provider. Make sure you discuss any questions you  have with your health care provider.  Preparing for Knee Replacement Recovery from knee replacement surgery can be made easier and more comfortable by taking steps to be prepared before surgery. This includes:  Arranging for others to help you.  Preparing your home.  Making sure your body is prepared by doing a pre-operative exam and being as healthy as you can.  Doing exercises before your surgery as told by your caregiver. Also, you can ease any concerns about your financial responsibilities by calling your insurance company after you decide to have surgery.In addition to your surgery and hospital stay, you will want to ask about your coverage for medical equipment, rehab facilities, and home care. ARRANGING FOR HELP  You will be getting stronger and more mobile every day. However, in the first couple weeks after surgery, it is unlikely you will be able to do all your daily activities as easily as before your surgery.You will tire easily and still have limited movement in your leg.Follow these guidelines to best arrange for the help you may need after your surgery:  Arrange for someone to drive you home from the hospital.Your surgeon will be able to tell you how many days you can expect to be in the hospital.  Cancel all work, care-giving, and volunteer responsibilities  for at least 4 to 6 weeks after your surgery.  If you live alone, arrange for someone to care for your home and pets for the first 4 to 6 weeks.  Select someone with whom you feel comfortable to be with you day and night for the first week.This person will help you with your exercises and personal care, like bathing and using the toilet.  Arrange for drivers to bring you to and from your doctor and therapy appointments, as well as to the grocery store and other places you need to go, for at least 4 to 6 weeks.  Select 2 or 3 rehab facilities where you would be comfortable recovering just in case you are not able to go directly home to recover. PREPARING YOUR HOME  Remove all clutter from your floors.  To see if you will be able to move in these spaces with a wheeled walker, hold your hands out about 6 inches from your sides. Then walk from your bed to the bathroom. Walk from your resting spot to your kitchen and bathroom.If you do not hit anything with your hands, you probably have enough room.  Remove throw rugs.  Move the items you use often to shelves and drawers that are at countertop height. Move items in your bathroom, kitchen, and bedroom.  Prepare a few meals that you can freeze and reheat later.  Do not plan on recovering in bed. It is better for your health to sit upright.You may wish to use a recliner with a small table nearby.Keep the items you use most frequently on that table. These may include the TV remote, a cordless phone, a book or laptop computer, water glass, and any other items of your choice.  Consider adding grab bars in the shower and near the toilet.  While you are in the hospital, you will learn about equipment helpful for your recovery.Some of the equipment includes raised toilet seats, tub benches, and shower benches.Often, your hospital care team will help you decide what you need and can direct you about where to buy these items.You may not need all of  these items, and they are not often returnable, so it is not recommended that  you buy them before going to the hospital. [place]  Complete a pre-operative exam.This will ensure that your body is healthy enough to safely complete this surgery.Be certain to bring a complete list of all your medicines and supplements (herbs and vitamins). You may be advised to have additional tests to ensure your safety.  Complete elective dental care and routine cleanings before the surgery.Germs from anywhere in your body, including those in your mouth, can travel to your new joint and infect it. It is important not to have any dental work performed for at least 3 months after your surgery.After surgery, be sure to tell your dentist about your joint replacement.  Maintain a healthy diet.Unless advised by your surgeon, do not drastically change your diet before your surgery.  Quit smoking.Get help from your caregiver if you need it.  The day before your surgery, follow your surgeon's directions for showering, eating and drinking.These directions are for your safety. EXERCISES Your caregiver may have you do the following exercises before your surgery. Be sure to follow the exercise program your caregiver prescribes for you. Doing the exercises on both sides will help prepare your "good" sideas well. While completing these exercises, remember:   Stretch as long as you can, up to 30 seconds, without pain developing.  You should only feel a gentle lengthening or release in the stretched tissue. Ankle Pumps 1. While sitting with your knees straight, draw the top of your feet upwards by flexing your ankles. 2. Then, reverse the motion, pointing your toes downward. 3. Repeat 10 to 20 times.Complete this exercise 1 to 2 times per day. Heel Slides 1. Lie on your back with both knees straight.(If this causes back discomfort, bend your opposite knee, placing your foot flat on the floor.) 2. Slowly  slide your heel back toward your buttocks until you feel a gentle stretch in the front of your knee or thigh. 3. Slowly slide your heel back to the starting position. 4. Repeat 10 to 20 times.Complete this exercise 1 to 2 times per day. Quadriceps Sets 1. Lie on your back with your sore leg extended and your opposite knee bent. 2. Gradually tense the muscles in the front of your thigh.You should see either your kneecap slide up toward your hip or increased dimpling just above the knee.This motion will push the back of the knee down toward the floor. 3. Hold the muscle as tight as you can without increasing your pain for 10 seconds. 4. Relax the muscles slowly and completely in between each repetition. 5. Repeat 10 to 20 times.Complete this exercise 1 to 2 times per day. Short Arc Kicks 1. Lie on your back.Place a 4 to 6 inch towel roll under your sore knee so that the knee slightly bends. 2. Raise only your lower leg by tightening the muscles in the front of your thigh.Do not allow your thigh to rise. 3. Hold this position for 5 seconds. 4. Repeat 10 to 20 times.Complete this exercise 1 to 2 times per day. Straight Leg Raises 1. Lie on your back with your sore leg extended and your opposite knee bent. 2. Tense the muscles in the front of your sore thigh.You should see either your kneecap slide up or increased dimpling just above the knee. Your thigh may even quiver. 3. Tighten these muscles even more and raise your leg 4 to 6 inches off the floor.Hold for 3 to 5 seconds. 4. Keeping these muscles tense, lower your leg. 5. Relax the  muscles slowly and completely in between each repetition. 6. Repeat 10 to 20 times.Complete this exercise 1 to 2 times per day. Arm Chair Push-ups 1. Find a firm, non-wheeled chair with solid armrests. 2. Sitting in the chair, extend your sore leg straight out in front of you. 3. Lift up your body weight, using your arms and opposite leg. 4. Slowly  lower your body weight. 5. Repeat 10 to 20 times.Complete this exercise 1 to 2 times per day. Document Released: 10/01/2010 Document Revised: 09/19/2011 Document Reviewed: 09/19/2013 [facility] Patient Information 2015 [place], [place]. This information is not intended to replace advice given to you by your health care provider. Make sure you discuss any questions you have with your health care provider.

## 2014-04-28 NOTE — ED Notes (Signed)
C/o pain and swelling L knee onset last week. Pain worse this AM  No known injury.  Hx. Osteoarthritis.

## 2014-04-29 LAB — PATHOLOGIST SMEAR REVIEW

## 2014-04-30 NOTE — ED Provider Notes (Signed)
Medical screening examination/treatment/procedure(s) were performed by a resident physician or non-physician practitioner and as the supervising physician I was immediately available for consultation/collaboration.  Lynne Leader, MD    Gregor Hams, MD 04/30/14 [phone]

## 2014-05-01 ENCOUNTER — Telehealth: Payer: Self-pay | Admitting: Internal Medicine

## 2014-05-01 NOTE — Telephone Encounter (Signed)
Patient requests a referral for an orthopedic surgeon. Please follow up with Patient.

## 2014-05-02 LAB — BODY FLUID CULTURE: CULTURE: NO GROWTH

## 2014-05-08 ENCOUNTER — Encounter (HOSPITAL_COMMUNITY): Payer: Self-pay | Admitting: Emergency Medicine

## 2014-05-08 ENCOUNTER — Emergency Department (INDEPENDENT_AMBULATORY_CARE_PROVIDER_SITE_OTHER)
Admission: EM | Admit: 2014-05-08 | Discharge: 2014-05-08 | Disposition: A | Discharge status: Home / Self Care | Payer: Medicare Other | Source: Home / Self Care | Attending: Family Medicine | Admitting: Family Medicine

## 2014-05-08 DIAGNOSIS — M179 Osteoarthritis of knee, unspecified: Secondary | ICD-10-CM | POA: Diagnosis not present

## 2014-05-08 DIAGNOSIS — M1712 Unilateral primary osteoarthritis, left knee: Secondary | ICD-10-CM

## 2014-05-08 MED ORDER — NAPROXEN 500 MG PO TABS: 500.0000 mg | ORAL_TABLET | Freq: Two times a day (BID) | ORAL | Status: DC

## 2014-05-08 NOTE — ED Notes (Signed)
Pt in for refill on medication.  States has been without for two days.

## 2014-05-08 NOTE — Discharge Instructions (Signed)

## 2014-05-08 NOTE — ED Provider Notes (Signed)
CSN: [id number]     Arrival date & time 05/08/14  1516 History   First MD Initiated Contact with Patient 05/08/14 1532     Chief Complaint  Patient presents with  . Medication Refill   (Consider location/radiation/quality/duration/timing/severity/associated sxs/prior Treatment) HPI Comments: Patient presents requesting medication refill for chronic left knee pain secondary to OA.  The history is provided by the patient.    Past Medical History  Diagnosis Date  . Hypertension   . Arthritis     osteoarthritis.   Past Surgical History  Procedure Laterality Date  . Tonsillectomy     Family History  Problem Relation Age of Onset  . Hypertension Mother   . Rheum arthritis Mother   . Diabetes Paternal Grandmother   . Diabetes Paternal Grandfather   . Other Father     suicide   History  Substance Use Topics  . Smoking status: Former Smoker    Quit date: 04/14/2014  . Smokeless tobacco: Not on file  . Alcohol Use: Yes     Comment: socially wine   OB History   Grav Para Term Preterm Abortions TAB SAB Ect Mult Living                 Review of Systems  All other systems reviewed and are negative.   Allergies  Review of patient's allergies indicates no known allergies.  Home Medications   Prior to Admission medications   Medication Sig Start Date End Date Taking? Authorizing Provider  buPROPion (WELLBUTRIN XL) 300 MG 24 hr tablet Take 300 mg by mouth daily.   Yes Historical Provider, MD  citalopram (CELEXA) 40 MG tablet Take 1 tablet (40 mg total) by mouth daily. 02/05/14  Yes [name], MD  lisinopril-hydrochlorothiazide (PRINZIDE,ZESTORETIC) 20-25 MG per tablet Take 1 tablet by mouth daily. 10/30/13  Yes [name], MD  Multiple Vitamin (MULTIVITAMIN WITH MINERALS) TABS tablet Take 1 tablet by mouth daily.   Yes Historical Provider, MD  naproxen (NAPROSYN) 375 MG tablet Take 1 tablet (375 mg total) by mouth 2 (two) times daily. 04/28/14  Yes Janne Napoleon, NP   traMADol (ULTRAM) 50 MG tablet Take 1 tablet (50 mg total) by mouth every 6 (six) hours as needed. 04/28/14  Yes Janne Napoleon, NP  naproxen (NAPROSYN) 500 MG tablet Take 1 tablet (500 mg total) by mouth 2 (two) times daily with a meal. 05/08/14   Annett Gula H Esterlene Atiyeh, PA   BP 135/87  Pulse 77  Resp 12  SpO2 100% Physical Exam  Nursing note and vitals reviewed. Constitutional: She is oriented to person, place, and time. She appears well-developed and well-nourished. No distress.  HENT:  Head: Normocephalic and atraumatic.  Eyes: Conjunctivae are normal. No scleral icterus.  Cardiovascular: Normal rate.   Pulmonary/Chest: Effort normal.  Musculoskeletal: Normal range of motion.  Mild to moderate chronic discomfort of left knee with ambulation  Neurological: She is alert and oriented to person, place, and time.  Skin: Skin is warm and dry.  Psychiatric: She has a normal mood and affect. Her behavior is normal.    ED Course  Procedures (including critical care time) Labs Review Labs Reviewed - No data to display  Imaging Review No results found.   MDM   1. Osteoarthritis of left knee, unspecified osteoarthritis type   refilled Rx for Naprosyn 500mg  po BID for patient. Encouraged to follow up with PCP for additional refills   Lutricia Feil, PA 05/08/14 1557

## 2014-05-20 ENCOUNTER — Telehealth: Payer: Self-pay | Admitting: Internal Medicine

## 2014-05-20 NOTE — Telephone Encounter (Signed)
Patient was contacted to schedule an appointment with PCP

## 2014-06-30 ENCOUNTER — Emergency Department (INDEPENDENT_AMBULATORY_CARE_PROVIDER_SITE_OTHER)
Admission: EM | Admit: 2014-06-30 | Discharge: 2014-06-30 | Disposition: A | Discharge status: Home / Self Care | Payer: Medicare Other | Source: Home / Self Care | Attending: Family Medicine | Admitting: Family Medicine

## 2014-06-30 ENCOUNTER — Encounter (HOSPITAL_COMMUNITY): Payer: Self-pay | Admitting: Emergency Medicine

## 2014-06-30 DIAGNOSIS — M1712 Unilateral primary osteoarthritis, left knee: Secondary | ICD-10-CM | POA: Diagnosis not present

## 2014-06-30 DIAGNOSIS — I10 Essential (primary) hypertension: Secondary | ICD-10-CM | POA: Diagnosis not present

## 2014-06-30 MED ORDER — LISINOPRIL-HYDROCHLOROTHIAZIDE 20-25 MG PO TABS: 1.0000 | ORAL_TABLET | Freq: Every day | ORAL | Status: DC

## 2014-06-30 MED ORDER — TRAMADOL HCL 50 MG PO TABS: 50.0000 mg | ORAL_TABLET | Freq: Four times a day (QID) | ORAL | Status: DC | PRN

## 2014-06-30 NOTE — ED Provider Notes (Signed)
CSN: [id number]     Arrival date & time 06/30/14  1526 History   First MD Initiated Contact with Patient 06/30/14 1609     Chief Complaint  Patient presents with  . Medication Refill   (Consider location/radiation/quality/duration/timing/severity/associated sxs/prior Treatment) HPI Comments: 63 year old female presents to the urgent care for refills of her chronic medication. She continues to miss appointments with community wellness as well as declining to call ahead of time to obtain an appointment. She has a history of hypertension and request refill of her Zestoretic as well as tramadol for chronic severe DJD of the knee.   Past Medical History  Diagnosis Date  . Hypertension   . Arthritis     osteoarthritis.   Past Surgical History  Procedure Laterality Date  . Tonsillectomy     Family History  Problem Relation Age of Onset  . Hypertension Mother   . Rheum arthritis Mother   . Diabetes Paternal Grandmother   . Diabetes Paternal Grandfather   . Other Father     suicide   History  Substance Use Topics  . Smoking status: Former Smoker    Quit date: 04/14/2014  . Smokeless tobacco: Not on file  . Alcohol Use: Yes     Comment: socially wine   OB History    No data available     Review of Systems  Constitutional: Negative for fever, activity change and fatigue.  HENT: Negative.   Respiratory: Negative for cough and shortness of breath.   Cardiovascular: Negative for chest pain and palpitations.  Gastrointestinal: Negative.   Musculoskeletal: Positive for arthralgias.  Skin: Negative for rash.  Neurological: Negative.     Allergies  Review of patient's allergies indicates no known allergies.  Home Medications   Prior to Admission medications   Medication Sig Start Date End Date Taking? Authorizing Provider  buPROPion (WELLBUTRIN XL) 300 MG 24 hr tablet Take 300 mg by mouth daily.    Historical Provider, MD  citalopram (CELEXA) 40 MG tablet Take 1 tablet (40  mg total) by mouth daily. 02/05/14   [name], MD  lisinopril-hydrochlorothiazide (ZESTORETIC) 20-25 MG per tablet Take 1 tablet by mouth daily. 06/30/14   Janne Napoleon, NP  Multiple Vitamin (MULTIVITAMIN WITH MINERALS) TABS tablet Take 1 tablet by mouth daily.    Historical Provider, MD  naproxen (NAPROSYN) 375 MG tablet Take 1 tablet (375 mg total) by mouth 2 (two) times daily. 04/28/14   Janne Napoleon, NP  naproxen (NAPROSYN) 500 MG tablet Take 1 tablet (500 mg total) by mouth 2 (two) times daily with a meal. 05/08/14   Audelia Hives Presson, PA  traMADol (ULTRAM) 50 MG tablet Take 1 tablet (50 mg total) by mouth every 6 (six) hours as needed. 06/30/14   Janne Napoleon, NP   BP 160/104 mmHg  Pulse 76  Temp(Src) 97.9 F (36.6 C) (Oral)  Resp 16  SpO2 99% Physical Exam  Constitutional: She is oriented to person, place, and time. She appears well-developed and well-nourished. No distress.  Cardiovascular: Normal rate, regular rhythm and normal heart sounds.   Pulmonary/Chest: Effort normal and breath sounds normal. No respiratory distress. She has no wheezes.  Musculoskeletal: She exhibits no edema.  Left knee tenderness  Neurological: She is alert and oriented to person, place, and time.  Skin: Skin is warm and dry.  Psychiatric: She has a normal mood and affect.  Nursing note and vitals reviewed.   ED Course  Procedures (including critical care time) Labs Review Labs  Reviewed - No data to display  Imaging Review No results found.   MDM   1. Essential hypertension   2. Primary osteoarthritis of left knee    rf zestoretic 20/25 #30 NR Tramadol 50 mg #15 Must call PCP and must keep appt with ortho       Janne Napoleon, NP 06/30/14 1644

## 2014-06-30 NOTE — Discharge Instructions (Signed)
Hypertension °Hypertension, commonly called high blood pressure, is when the force of blood pumping through your arteries is too strong. Your arteries are the blood vessels that carry blood from your heart throughout your body. A blood pressure reading consists of a higher number over a lower number, such as 110/72. The higher number (systolic) is the pressure inside your arteries when your heart pumps. The lower number (diastolic) is the pressure inside your arteries when your heart relaxes. Ideally you want your blood pressure below 120/80. °Hypertension forces your heart to work harder to pump blood. Your arteries may become narrow or stiff. Having hypertension puts you at risk for heart disease, stroke, and other problems.  °RISK FACTORS °Some risk factors for high blood pressure are controllable. Others are not.  °Risk factors you cannot control include:  °· Race. You may be at higher risk if you are African American. °· Age. Risk increases with age. °· Gender. Men are at higher risk than women before age 45 years. After age 65, women are at higher risk than men. °Risk factors you can control include: °· Not getting enough exercise or physical activity. °· Being overweight. °· Getting too much fat, sugar, calories, or salt in your diet. °· Drinking too much alcohol. °SIGNS AND SYMPTOMS °Hypertension does not usually cause signs or symptoms. Extremely high blood pressure (hypertensive crisis) may cause headache, anxiety, shortness of breath, and nosebleed. °DIAGNOSIS  °To check if you have hypertension, your health care provider will measure your blood pressure while you are seated, with your arm held at the level of your heart. It should be measured at least twice using the same arm. Certain conditions can cause a difference in blood pressure between your right and left arms. A blood pressure reading that is higher than normal on one occasion does not mean that you need treatment. If one blood pressure reading  is high, ask your health care provider about having it checked again. °TREATMENT  °Treating high blood pressure includes making lifestyle changes and possibly taking medicine. Living a healthy lifestyle can help lower high blood pressure. You may need to change some of your habits. °Lifestyle changes may include: °· Following the DASH diet. This diet is high in fruits, vegetables, and whole grains. It is low in salt, red meat, and added sugars. °· Getting at least 2½ hours of brisk physical activity every week. °· Losing weight if necessary. °· Not smoking. °· Limiting alcoholic beverages. °· Learning ways to reduce stress. ° If lifestyle changes are not enough to get your blood pressure under control, your health care provider may prescribe medicine. You may need to take more than one. Work closely with your health care provider to understand the risks and benefits. °HOME CARE INSTRUCTIONS °· Have your blood pressure rechecked as directed by your health care provider.   °· Take medicines only as directed by your health care provider. Follow the directions carefully. Blood pressure medicines must be taken as prescribed. The medicine does not work as well when you skip doses. Skipping doses also puts you at risk for problems.   °· Do not smoke.   °· Monitor your blood pressure at home as directed by your health care provider.  °SEEK MEDICAL CARE IF:  °· You think you are having a reaction to medicines taken. °· You have recurrent headaches or feel dizzy. °· You have swelling in your ankles. °· You have trouble with your vision. °SEEK IMMEDIATE MEDICAL CARE IF: °· You develop a severe headache or confusion. °·   You have unusual weakness, numbness, or feel faint.  You have severe chest or abdominal pain.  You vomit repeatedly.  You have trouble breathing. MAKE SURE YOU:   Understand these instructions.  Will watch your condition.  Will get help right away if you are not doing well or get worse. Document  Released: 06/27/2005 Document Revised: 11/11/2013 Document Reviewed: 04/19/2013 [facility] Patient Information 2015 [place], [place]. This information is not intended to replace advice given to you by your health care provider. Make sure you discuss any questions you have with your health care provider.  Osteoarthritis Osteoarthritis is a disease that causes soreness and inflammation of a joint. It occurs when the cartilage at the affected joint wears down. Cartilage acts as a cushion, covering the ends of bones where they meet to form a joint. Osteoarthritis is the most common form of arthritis. It often occurs in older people. The joints affected most often by this condition include those in the:  Ends of the fingers.  Thumbs.  Neck.  Lower back.  Knees.  Hips. CAUSES  Over time, the cartilage that covers the ends of bones begins to wear away. This causes bone to rub on bone, producing pain and stiffness in the affected joints.  RISK FACTORS Certain factors can increase your chances of having osteoarthritis, including:  Older age.  Excessive body weight.  Overuse of joints.  Previous joint injury. SIGNS AND SYMPTOMS   Pain, swelling, and stiffness in the joint.  Over time, the joint may lose its normal shape.  Small deposits of bone (osteophytes) may grow on the edges of the joint.  Bits of bone or cartilage can break off and float inside the joint space. This may cause more pain and damage. DIAGNOSIS  Your health care provider will do a physical exam and ask about your symptoms. Various tests may be ordered, such as:  X-rays of the affected joint.  An MRI scan.  Blood tests to rule out other types of arthritis.  Joint fluid tests. This involves using a needle to draw fluid from the joint and examining the fluid under a microscope. TREATMENT  Goals of treatment are to control pain and improve joint function. Treatment plans may include:  A prescribed exercise program  that allows for rest and joint relief.  A weight control plan.  Pain relief techniques, such as:  Properly applied heat and cold.  Electric pulses delivered to nerve endings under the skin (transcutaneous electrical nerve stimulation [TENS]).  Massage.  Certain nutritional supplements.  Medicines to control pain, such as:  Acetaminophen.  Nonsteroidal anti-inflammatory drugs (NSAIDs), such as naproxen.  Narcotic or central-acting agents, such as tramadol.  Corticosteroids. These can be given orally or as an injection.  Surgery to reposition the bones and relieve pain (osteotomy) or to remove loose pieces of bone and cartilage. Joint replacement may be needed in advanced states of osteoarthritis. HOME CARE INSTRUCTIONS   Take medicines only as directed by your health care provider.  Maintain a healthy weight. Follow your health care provider's instructions for weight control. This may include dietary instructions.  Exercise as directed. Your health care provider can recommend specific types of exercise. These may include:  Strengthening exercises. These are done to strengthen the muscles that support joints affected by arthritis. They can be performed with weights or with exercise bands to add resistance.  Aerobic activities. These are exercises, such as brisk walking or low-impact aerobics, that get your heart pumping.  Range-of-motion activities. These keep  your joints limber.  Balance and agility exercises. These help you maintain daily living skills.  Rest your affected joints as directed by your health care provider.  Keep all follow-up visits as directed by your health care provider. SEEK MEDICAL CARE IF:   Your skin turns red.  You develop a rash in addition to your joint pain.  You have worsening joint pain.  You have a fever along with joint or muscle aches. SEEK IMMEDIATE MEDICAL CARE IF:  You have a significant loss of weight or appetite.  You have  night sweats. [place] of Arthritis and Musculoskeletal and Skin Diseases: www.niams.SouthExposed.es  [employer] on Aging: http://kim-miller.com/  American College of Rheumatology: www.rheumatology.org Document Released: 06/27/2005 Document Revised: 11/11/2013 Document Reviewed: 03/04/2013 [facility] Patient Information 2015 [place], [place]. This information is not intended to replace advice given to you by your health care provider. Make sure you discuss any questions you have with your health care provider.

## 2014-06-30 NOTE — ED Notes (Signed)
Pt states that she needs her blood pressure and pain medications refilled.

## 2014-07-15 ENCOUNTER — Encounter: Payer: Self-pay | Admitting: Internal Medicine

## 2014-07-15 ENCOUNTER — Ambulatory Visit (HOSPITAL_BASED_OUTPATIENT_CLINIC_OR_DEPARTMENT_OTHER): Payer: Medicare Other

## 2014-07-15 ENCOUNTER — Ambulatory Visit: Payer: Medicare Other | Attending: Internal Medicine | Admitting: Internal Medicine

## 2014-07-15 VITALS — BP 120/90 | HR 68 | Temp 98.0°F | Resp 16 | Wt 155.0 lb

## 2014-07-15 DIAGNOSIS — F418 Other specified anxiety disorders: Secondary | ICD-10-CM | POA: Diagnosis not present

## 2014-07-15 DIAGNOSIS — Z23 Encounter for immunization: Secondary | ICD-10-CM | POA: Insufficient documentation

## 2014-07-15 DIAGNOSIS — Z1231 Encounter for screening mammogram for malignant neoplasm of breast: Secondary | ICD-10-CM | POA: Diagnosis not present

## 2014-07-15 DIAGNOSIS — F419 Anxiety disorder, unspecified: Secondary | ICD-10-CM | POA: Diagnosis not present

## 2014-07-15 DIAGNOSIS — F329 Major depressive disorder, single episode, unspecified: Secondary | ICD-10-CM | POA: Diagnosis not present

## 2014-07-15 DIAGNOSIS — I10 Essential (primary) hypertension: Secondary | ICD-10-CM | POA: Diagnosis not present

## 2014-07-15 DIAGNOSIS — Z791 Long term (current) use of non-steroidal anti-inflammatories (NSAID): Secondary | ICD-10-CM | POA: Diagnosis not present

## 2014-07-15 DIAGNOSIS — M1712 Unilateral primary osteoarthritis, left knee: Secondary | ICD-10-CM

## 2014-07-15 DIAGNOSIS — Z1211 Encounter for screening for malignant neoplasm of colon: Secondary | ICD-10-CM | POA: Diagnosis not present

## 2014-07-15 DIAGNOSIS — Z87891 Personal history of nicotine dependence: Secondary | ICD-10-CM | POA: Diagnosis not present

## 2014-07-15 DIAGNOSIS — M179 Osteoarthritis of knee, unspecified: Secondary | ICD-10-CM | POA: Insufficient documentation

## 2014-07-15 LAB — COMPLETE METABOLIC PANEL WITH GFR
ALT: 16 U/L (ref 0–35)
AST: 19 U/L (ref 0–37)
Albumin: 4.4 g/dL (ref 3.5–5.2)
Alkaline Phosphatase: 77 U/L (ref 39–117)
BUN: 20 mg/dL (ref 6–23)
CO2: 27 meq/L (ref 19–32)
CREATININE: 0.85 mg/dL (ref 0.50–1.10)
Calcium: 9.7 mg/dL (ref 8.4–10.5)
Chloride: 104 mEq/L (ref 96–112)
GFR, EST AFRICAN AMERICAN: 83 mL/min
GFR, Est Non African American: 72 mL/min
GLUCOSE: 85 mg/dL (ref 70–99)
Potassium: 4.2 mEq/L (ref 3.5–5.3)
Sodium: 141 mEq/L (ref 135–145)
Total Bilirubin: 0.3 mg/dL (ref 0.2–1.2)
Total Protein: 7.4 g/dL (ref 6.0–8.3)

## 2014-07-15 MED ORDER — LISINOPRIL-HYDROCHLOROTHIAZIDE 20-25 MG PO TABS: 1.0000 | ORAL_TABLET | Freq: Every day | ORAL | Status: DC

## 2014-07-15 MED ORDER — TRAMADOL HCL 50 MG PO TABS: 50.0000 mg | ORAL_TABLET | Freq: Four times a day (QID) | ORAL | Status: DC | PRN

## 2014-07-15 NOTE — Progress Notes (Signed)
Patient complains of having arthritis in both knees Complains of pain to left knee is worse than the right knee

## 2014-07-15 NOTE — Patient Instructions (Signed)
DASH Eating Plan °DASH stands for "Dietary Approaches to Stop Hypertension." The DASH eating plan is a healthy eating plan that has been shown to reduce high blood pressure (hypertension). Additional health benefits may include reducing the risk of type 2 diabetes mellitus, heart disease, and stroke. The DASH eating plan may also help with weight loss. °WHAT DO I NEED TO KNOW ABOUT THE DASH EATING PLAN? °For the DASH eating plan, you will follow these general guidelines: °· Choose foods with a percent daily value for sodium of less than 5% (as listed on the food label). °· Use salt-free seasonings or herbs instead of table salt or sea salt. °· Check with your health care provider or pharmacist before using salt substitutes. °· Eat lower-sodium products, often labeled as "lower sodium" or "no salt added." °· Eat fresh foods. °· Eat more vegetables, fruits, and low-fat dairy products. °· Choose whole grains. Look for the word "whole" as the first word in the ingredient list. °· Choose fish and skinless chicken or turkey more often than red meat. Limit fish, poultry, and meat to 6 oz (170 g) each day. °· Limit sweets, desserts, sugars, and sugary drinks. °· Choose heart-healthy fats. °· Limit cheese to 1 oz (28 g) per day. °· Eat more home-cooked food and less restaurant, buffet, and fast food. °· Limit fried foods. °· Cook foods using methods other than frying. °· Limit canned vegetables. If you do use them, rinse them well to decrease the sodium. °· When eating at a restaurant, ask that your food be prepared with less salt, or no salt if possible. °WHAT FOODS CAN I EAT? °Seek help from a dietitian for individual calorie needs. °Grains °Whole grain or whole wheat bread. Brown rice. Whole grain or whole wheat pasta. Quinoa, bulgur, and whole grain cereals. Low-sodium cereals. Corn or whole wheat flour tortillas. Whole grain cornbread. Whole grain crackers. Low-sodium crackers. °Vegetables °Fresh or frozen vegetables  (raw, steamed, roasted, or grilled). Low-sodium or reduced-sodium tomato and vegetable juices. Low-sodium or reduced-sodium tomato sauce and paste. Low-sodium or reduced-sodium canned vegetables.  °Fruits °All fresh, canned (in natural juice), or frozen fruits. °Meat and Other Protein Products °Ground beef (85% or leaner), grass-fed beef, or beef trimmed of fat. Skinless chicken or turkey. Ground chicken or turkey. Pork trimmed of fat. All fish and seafood. Eggs. Dried beans, peas, or lentils. Unsalted nuts and seeds. Unsalted canned beans. °Dairy °Low-fat dairy products, such as skim or 1% milk, 2% or reduced-fat cheeses, low-fat ricotta or cottage cheese, or plain low-fat yogurt. Low-sodium or reduced-sodium cheeses. °Fats and Oils °Tub margarines without trans fats. Light or reduced-fat mayonnaise and salad dressings (reduced sodium). Avocado. Safflower, olive, or canola oils. Natural peanut or almond butter. °Other °Unsalted popcorn and pretzels. °The items listed above may not be a complete list of recommended foods or beverages. Contact your dietitian for more options. °WHAT FOODS ARE NOT RECOMMENDED? °Grains °White bread. White pasta. White rice. Refined cornbread. Bagels and croissants. Crackers that contain trans fat. °Vegetables °Creamed or fried vegetables. Vegetables in a cheese sauce. Regular canned vegetables. Regular canned tomato sauce and paste. Regular tomato and vegetable juices. °Fruits °Dried fruits. Canned fruit in light or heavy syrup. Fruit juice. °Meat and Other Protein Products °Fatty cuts of meat. Ribs, chicken wings, bacon, sausage, bologna, salami, chitterlings, fatback, hot dogs, bratwurst, and packaged luncheon meats. Salted nuts and seeds. Canned beans with salt. °Dairy °Whole or 2% milk, cream, half-and-half, and cream cheese. Whole-fat or sweetened yogurt. Full-fat   cheeses or blue cheese. Nondairy creamers and whipped toppings. Processed cheese, cheese spreads, or cheese  curds. °Condiments °Onion and garlic salt, seasoned salt, table salt, and sea salt. Canned and packaged gravies. Worcestershire sauce. Tartar sauce. Barbecue sauce. Teriyaki sauce. Soy sauce, including reduced sodium. Steak sauce. Fish sauce. Oyster sauce. Cocktail sauce. Horseradish. Ketchup and mustard. Meat flavorings and tenderizers. Bouillon cubes. Hot sauce. Tabasco sauce. Marinades. Taco seasonings. Relishes. °Fats and Oils °Butter, stick margarine, lard, shortening, ghee, and bacon fat. Coconut, palm kernel, or palm oils. Regular salad dressings. °Other °Pickles and olives. Salted popcorn and pretzels. °The items listed above may not be a complete list of foods and beverages to avoid. Contact your dietitian for more information. °WHERE CAN I FIND MORE INFORMATION? °National Heart, Lung, and [employer] Institute: www.nhlbi.nih.gov/health/health-topics/topics/dash/ °Document Released: 06/16/2011 Document Revised: 11/11/2013 Document Reviewed: 05/01/2013 °[employer]® Patient Information ©2015 [employer]. This information is not intended to replace advice given to you by your health care provider. Make sure you discuss any questions you have with your health care provider. ° °

## 2014-07-15 NOTE — Progress Notes (Signed)
MRN: [id number] Name: Kristen Chavez  Sex: female Age: 64 y.o. DOB: 12-10-1950  Allergies: Review of patient's allergies indicates no known allergies.  Chief Complaint  Patient presents with  . Knee Pain    HPI: Patient is 64 y.o. female who history of hypertension, anxiety/depression, osteoarthritis of knees, last time she was seen in our office was almost a year ago, recently went to urgent care for medication refill, as per patient she is following up with mental health and has been prescribed Celexa and Wellbutrin which helps her with the symptoms, denies any SI or HI, for hypertension she has been taking lisinopril/hydrochlorothiazide, today her manual blood pressure is 120/90, she denies any headache dizziness chest and shortness of breath, as per patient she has already quit smoking, she's up-to-date with flu shot, she has not done mammogram recently, also was referred to GI for screening colonoscopy but she could not make an appointment.In the past she was also referred to orthopedics for knee osteoarthritis, needs another referral, she's requesting refill on tramadol which she takes when necessary for pain  Past Medical History  Diagnosis Date  . Hypertension   . Arthritis     osteoarthritis.    Past Surgical History  Procedure Laterality Date  . Tonsillectomy        Medication List       This list is accurate as of: 07/15/14 11:55 AM.  Always use your most recent med list.               buPROPion 300 MG 24 hr tablet  Commonly known as:  WELLBUTRIN XL  Take 300 mg by mouth daily.     citalopram 40 MG tablet  Commonly known as:  CELEXA  Take 1 tablet (40 mg total) by mouth daily.     lisinopril-hydrochlorothiazide 20-25 MG per tablet  Commonly known as:  ZESTORETIC  Take 1 tablet by mouth daily.     multivitamin with minerals Tabs tablet  Take 1 tablet by mouth daily.     naproxen 375 MG tablet  Commonly known as:  NAPROSYN  Take 1 tablet (375 mg total)  by mouth 2 (two) times daily.     naproxen 500 MG tablet  Commonly known as:  NAPROSYN  Take 1 tablet (500 mg total) by mouth 2 (two) times daily with a meal.     traMADol 50 MG tablet  Commonly known as:  ULTRAM  Take 1 tablet (50 mg total) by mouth every 6 (six) hours as needed.        Meds ordered this encounter  Medications  . lisinopril-hydrochlorothiazide (ZESTORETIC) 20-25 MG per tablet    Sig: Take 1 tablet by mouth daily.    Dispense:  30 tablet    Refill:  3  . traMADol (ULTRAM) 50 MG tablet    Sig: Take 1 tablet (50 mg total) by mouth every 6 (six) hours as needed.    Dispense:  15 tablet    Refill:  0    Immunization History  Administered Date(s) Administered  . Influenza,inj,Quad PF,36+ Mos 08/26/2013, 07/15/2014    Family History  Problem Relation Age of Onset  . Hypertension Mother   . Rheum arthritis Mother   . Diabetes Paternal Grandmother   . Diabetes Paternal Grandfather   . Other Father     suicide    History  Substance Use Topics  . Smoking status: Former Smoker    Quit date: 04/14/2014  . Smokeless tobacco: Not  on file  . Alcohol Use: Yes     Comment: socially wine    Review of Systems   As noted in HPI  Filed Vitals:   07/15/14 1148  BP: 120/90  Pulse:   Temp:   Resp:     Physical Exam  Physical Exam  Constitutional: No distress.  Eyes: EOM are normal. Pupils are equal, round, and reactive to light.  Cardiovascular: Normal rate and regular rhythm.   Pulmonary/Chest: Breath sounds normal. No respiratory distress. She has no wheezes. She has no rales.  Musculoskeletal:  Bilateral knee crepitation +    CBC    Component Value Date/Time   WBC 6.9 08/26/2013 1235   RBC 4.81 08/26/2013 1235   HGB 12.6 08/26/2013 1235   HCT 38.0 08/26/2013 1235   PLT 288 08/26/2013 1235   MCV 79.0 08/26/2013 1235   LYMPHSABS 2.4 08/26/2013 1235   MONOABS 0.6 08/26/2013 1235   EOSABS 0.1 08/26/2013 1235   BASOSABS 0.1 08/26/2013 1235      CMP     Component Value Date/Time   NA 140 08/26/2013 1235   K 3.8 08/26/2013 1235   CL 102 08/26/2013 1235   CO2 28 08/26/2013 1235   GLUCOSE 96 08/26/2013 1235   BUN 15 08/26/2013 1235   CREATININE 0.77 08/26/2013 1235   CREATININE 0.74 07/19/2013 1132   CALCIUM 9.7 08/26/2013 1235   PROT 7.3 08/26/2013 1235   ALBUMIN 4.3 08/26/2013 1235   AST 18 08/26/2013 1235   ALT 16 08/26/2013 1235   ALKPHOS 77 08/26/2013 1235   BILITOT 0.3 08/26/2013 1235   GFRNONAA 82 08/26/2013 1235   GFRNONAA 88* 07/19/2013 1132   GFRAA >89 08/26/2013 1235   GFRAA >90 07/19/2013 1132    Lab Results  Component Value Date/Time   CHOL 163 08/26/2013 12:35 PM    No components found for: HGA1C  Lab Results  Component Value Date/Time   AST 18 08/26/2013 12:35 PM    Assessment and Plan  Essential hypertension - Plan: advised patient for DASH diet, continue with lisinopril-hydrochlorothiazide (ZESTORETIC) 20-25 MG per tablet,will repeat COMPLETE METABOLIC PANEL WITH GFR  Anxiety and depression Currently following up with mental health, on Celexa and Wellbutrin.  Osteoarthritis of left knee, unspecified osteoarthritis type - Plan: traMADol (ULTRAM) 50 MG tablet, Ambulatory referral to Orthopedic Surgery  Special screening for malignant neoplasms, colon - Plan: Ambulatory referral to Gastroenterology  Encounter for screening mammogram for breast cancer - Plan: MM DIGITAL SCREENING BILATERAL   Health Maintenance -Colonoscopy: referred to GI -Pap Smear: uptodate  -Mammogram: ordered  -Vaccinations:  Flu shot given today   Return in about 3 months (around 10/14/2014) for hypertension.  [name], MD

## 2014-07-16 ENCOUNTER — Telehealth: Payer: Self-pay | Admitting: Internal Medicine

## 2014-07-16 ENCOUNTER — Telehealth: Payer: Self-pay | Admitting: Emergency Medicine

## 2014-07-16 NOTE — Telephone Encounter (Signed)
Patient calling to request a letter stating that she is able to return to work and also stating that surgery is not an option at this time. Patient would like to have this letter faxed to her place of employment. Fax number [phone] ATTN: Velna Ochs. Please assist.

## 2014-07-16 NOTE — Telephone Encounter (Signed)
-----   Message from [name], MD sent at 07/16/2014  9:12 AM EST ----- Call and let the Patient know that blood work is normal.

## 2014-07-16 NOTE — Telephone Encounter (Signed)
Patient missed call from nurse and is calling to speak back to nurse. Transferred to nurse line.

## 2014-07-16 NOTE — Telephone Encounter (Signed)
Patient calling to request a letter stating that she is able to return to work and also stating that surgery is not an option at this time. Patient would like to have this letter faxed to her place of employment. Fax number [phone] ATTN: Velna Ochs.

## 2014-07-23 ENCOUNTER — Telehealth: Payer: Self-pay | Admitting: *Deleted

## 2014-07-23 NOTE — Telephone Encounter (Signed)
-----   Message from [name], MD sent at 07/16/2014  9:12 AM EST ----- Call and let the Patient know that blood work is normal.

## 2014-07-23 NOTE — Telephone Encounter (Signed)
Left message that lab results were normal.

## 2014-07-29 ENCOUNTER — Ambulatory Visit: Payer: Medicare Other | Admitting: Sports Medicine

## 2014-08-06 ENCOUNTER — Telehealth: Payer: Self-pay | Admitting: Internal Medicine

## 2014-08-06 NOTE — Telephone Encounter (Signed)
if medications were prescribed by mental health, advise patient to follow with her psychiatrist  And get the medication refill , meanwhile she can be given one month supply on Celexa and Wellbutrin.
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
## 2014-08-07 NOTE — Telephone Encounter (Signed)
Pt stated she is on her way to mental health and will get Rx form them

## 2014-08-13 ENCOUNTER — Encounter: Payer: Self-pay | Admitting: Sports Medicine

## 2014-08-13 ENCOUNTER — Ambulatory Visit (INDEPENDENT_AMBULATORY_CARE_PROVIDER_SITE_OTHER): Payer: Medicare Other | Admitting: Sports Medicine

## 2014-08-13 VITALS — BP 133/84 | HR 83 | Ht 65.0 in | Wt 154.0 lb

## 2014-08-13 DIAGNOSIS — M25562 Pain in left knee: Secondary | ICD-10-CM | POA: Diagnosis not present

## 2014-08-13 DIAGNOSIS — M1712 Unilateral primary osteoarthritis, left knee: Secondary | ICD-10-CM | POA: Diagnosis not present

## 2014-08-13 MED ORDER — MELOXICAM 15 MG PO TABS: ORAL_TABLET | ORAL | Status: DC

## 2014-08-14 ENCOUNTER — Ambulatory Visit (HOSPITAL_COMMUNITY): Payer: Medicare Other

## 2014-08-14 NOTE — Progress Notes (Signed)
   Subjective:    Patient ID: Kristen Chavez, female    DOB: 1950/08/05, 64 y.o.   MRN: [id number]  HPI chief complaint: Left knee pain  Very pleasant 64 year old female comes in today complaining of long-standing left knee pain. She has been diagnosed with osteoarthritis in this knee in the past. She has been treated in the past with pain medications. She has also had the knee aspirated on 2 separate occasions. She currently takes tramadol, Tylenol, and Advil as needed. An x-ray done in 2010 shows approaching bone-on-bone medial compartmental DJD as well as a large loose body in the posterior knee. She denies any mechanical symptoms. She was told that she needed a total knee arthroplasty but her symptoms are currently tolerable. She continues to work. She does not feel like her knee limits her ability to do her job. She is requesting a letter to give to her employer. Past medical history reviewed Medications reviewed Allergies reviewed    Review of Systems     Objective:   Physical Exam Well-developed, well-nourished. No acute distress. Awake alert and oriented 3. Vital signs reviewed  Left knee: Range of motion 0-135. Trace to 1+ effusion. No erythema. Good overall alignment. Good strength. Slight tenderness to palpation along the medial joint line but a negative McMurray's. Good joint stability. Neurovascularly intact distally. Walking without a limp.  X-rays from 2010 of the left knee is as above       Assessment & Plan:  Left knee pain secondary to end-stage DJD  We will try Mobic 15 mg daily for the next 5 days and then when necessary. She can continue with tramadol and Tylenol as needed but will stop her Advil while taking the Mobic. Body helix compression sleeve when active. I will give her a note to give to her employer stating that she has no restrictions. She has good mobility and good strength in her knee. If she does experience increasing pain or swelling she may return to  the office for consideration of an aspiration and cortisone injection. Otherwise, follow-up when necessary.

## 2014-08-18 ENCOUNTER — Telehealth: Payer: Self-pay

## 2014-08-18 NOTE — Telephone Encounter (Signed)
Patient not available Left message on voice mail that her blood results were all normal

## 2014-08-18 NOTE — Telephone Encounter (Signed)
-----   Message from [name], MD sent at 07/16/2014  9:12 AM EST ----- Call and let the Patient know that blood work is normal.

## 2014-08-19 ENCOUNTER — Ambulatory Visit (HOSPITAL_COMMUNITY): Payer: Medicare Other | Attending: Internal Medicine

## 2014-08-28 ENCOUNTER — Telehealth: Payer: Self-pay | Admitting: Internal Medicine

## 2014-08-28 NOTE — Telephone Encounter (Signed)
Patient called to request a med refill for Tramadol, HCTZ, and Lisinopril. Please f/u with pt.

## 2014-08-28 NOTE — Telephone Encounter (Signed)
Patient can be given refill on the medications

## 2014-09-10 ENCOUNTER — Telehealth: Payer: Self-pay

## 2014-09-10 ENCOUNTER — Other Ambulatory Visit: Payer: Self-pay

## 2014-09-10 ENCOUNTER — Other Ambulatory Visit: Payer: Self-pay | Admitting: Internal Medicine

## 2014-09-10 DIAGNOSIS — M1712 Unilateral primary osteoarthritis, left knee: Secondary | ICD-10-CM

## 2014-09-10 DIAGNOSIS — I10 Essential (primary) hypertension: Secondary | ICD-10-CM

## 2014-09-10 MED ORDER — TRAMADOL HCL 50 MG PO TABS: 50.0000 mg | ORAL_TABLET | Freq: Four times a day (QID) | ORAL | Status: DC | PRN

## 2014-09-10 MED ORDER — CITALOPRAM HYDROBROMIDE 40 MG PO TABS: 40.0000 mg | ORAL_TABLET | Freq: Every day | ORAL | Status: DC

## 2014-09-10 MED ORDER — LISINOPRIL-HYDROCHLOROTHIAZIDE 20-25 MG PO TABS: 1.0000 | ORAL_TABLET | Freq: Every day | ORAL | Status: DC

## 2014-09-10 MED ORDER — BUPROPION HCL ER (XL) 300 MG PO TB24: 300.0000 mg | ORAL_TABLET | Freq: Every day | ORAL | Status: DC

## 2014-09-10 NOTE — Telephone Encounter (Signed)
Patient called requesting refills on wellbutrin,celexa,lisinopril,HCTZ And tramadol Explained to patient that Dr Annitta Needs is out of office until next week and that  i would need to talk to our Director and get back to her with an answer

## 2014-09-10 NOTE — Progress Notes (Unsigned)
Per Dr Doreene Burke we can refill patient's request

## 2014-10-20 ENCOUNTER — Encounter: Payer: Self-pay | Admitting: Internal Medicine

## 2014-10-20 ENCOUNTER — Ambulatory Visit: Payer: Medicare Other | Attending: Internal Medicine | Admitting: Internal Medicine

## 2014-10-20 VITALS — BP 146/98 | HR 82 | Temp 98.0°F | Resp 16 | Wt 151.0 lb

## 2014-10-20 DIAGNOSIS — F419 Anxiety disorder, unspecified: Secondary | ICD-10-CM

## 2014-10-20 DIAGNOSIS — F418 Other specified anxiety disorders: Secondary | ICD-10-CM

## 2014-10-20 DIAGNOSIS — Z720 Tobacco use: Secondary | ICD-10-CM | POA: Diagnosis not present

## 2014-10-20 DIAGNOSIS — I10 Essential (primary) hypertension: Secondary | ICD-10-CM | POA: Diagnosis not present

## 2014-10-20 DIAGNOSIS — M179 Osteoarthritis of knee, unspecified: Secondary | ICD-10-CM | POA: Insufficient documentation

## 2014-10-20 DIAGNOSIS — F329 Major depressive disorder, single episode, unspecified: Secondary | ICD-10-CM

## 2014-10-20 DIAGNOSIS — M1712 Unilateral primary osteoarthritis, left knee: Secondary | ICD-10-CM

## 2014-10-20 MED ORDER — LISINOPRIL-HYDROCHLOROTHIAZIDE 20-25 MG PO TABS: 1.0000 | ORAL_TABLET | Freq: Every day | ORAL | Status: DC

## 2014-10-20 MED ORDER — CITALOPRAM HYDROBROMIDE 40 MG PO TABS: 40.0000 mg | ORAL_TABLET | Freq: Every day | ORAL | Status: DC

## 2014-10-20 MED ORDER — BUPROPION HCL ER (XL) 300 MG PO TB24: 300.0000 mg | ORAL_TABLET | Freq: Every day | ORAL | Status: DC

## 2014-10-20 NOTE — Progress Notes (Signed)
Patient here for fluid build up to both knees Will need referral to ortho to have the fluid drained Patient also requesting refills on her medications

## 2014-10-20 NOTE — Patient Instructions (Signed)
DASH Eating Plan °DASH stands for "Dietary Approaches to Stop Hypertension." The DASH eating plan is a healthy eating plan that has been shown to reduce high blood pressure (hypertension). Additional health benefits may include reducing the risk of type 2 diabetes mellitus, heart disease, and stroke. The DASH eating plan may also help with weight loss. °WHAT DO I NEED TO KNOW ABOUT THE DASH EATING PLAN? °For the DASH eating plan, you will follow these general guidelines: °· Choose foods with a percent daily value for sodium of less than 5% (as listed on the food label). °· Use salt-free seasonings or herbs instead of table salt or sea salt. °· Check with your health care provider or pharmacist before using salt substitutes. °· Eat lower-sodium products, often labeled as "lower sodium" or "no salt added." °· Eat fresh foods. °· Eat more vegetables, fruits, and low-fat dairy products. °· Choose whole grains. Look for the word "whole" as the first word in the ingredient list. °· Choose fish and skinless chicken or turkey more often than red meat. Limit fish, poultry, and meat to 6 oz (170 g) each day. °· Limit sweets, desserts, sugars, and sugary drinks. °· Choose heart-healthy fats. °· Limit cheese to 1 oz (28 g) per day. °· Eat more home-cooked food and less restaurant, buffet, and fast food. °· Limit fried foods. °· Cook foods using methods other than frying. °· Limit canned vegetables. If you do use them, rinse them well to decrease the sodium. °· When eating at a restaurant, ask that your food be prepared with less salt, or no salt if possible. °WHAT FOODS CAN I EAT? °Seek help from a dietitian for individual calorie needs. °Grains °Whole grain or whole wheat bread. Brown rice. Whole grain or whole wheat pasta. Quinoa, bulgur, and whole grain cereals. Low-sodium cereals. Corn or whole wheat flour tortillas. Whole grain cornbread. Whole grain crackers. Low-sodium crackers. °Vegetables °Fresh or frozen vegetables  (raw, steamed, roasted, or grilled). Low-sodium or reduced-sodium tomato and vegetable juices. Low-sodium or reduced-sodium tomato sauce and paste. Low-sodium or reduced-sodium canned vegetables.  °Fruits °All fresh, canned (in natural juice), or frozen fruits. °Meat and Other Protein Products °Ground beef (85% or leaner), grass-fed beef, or beef trimmed of fat. Skinless chicken or turkey. Ground chicken or turkey. Pork trimmed of fat. All fish and seafood. Eggs. Dried beans, peas, or lentils. Unsalted nuts and seeds. Unsalted canned beans. °Dairy °Low-fat dairy products, such as skim or 1% milk, 2% or reduced-fat cheeses, low-fat ricotta or cottage cheese, or plain low-fat yogurt. Low-sodium or reduced-sodium cheeses. °Fats and Oils °Tub margarines without trans fats. Light or reduced-fat mayonnaise and salad dressings (reduced sodium). Avocado. Safflower, olive, or canola oils. Natural peanut or almond butter. °Other °Unsalted popcorn and pretzels. °The items listed above may not be a complete list of recommended foods or beverages. Contact your dietitian for more options. °WHAT FOODS ARE NOT RECOMMENDED? °Grains °White bread. White pasta. White rice. Refined cornbread. Bagels and croissants. Crackers that contain trans fat. °Vegetables °Creamed or fried vegetables. Vegetables in a cheese sauce. Regular canned vegetables. Regular canned tomato sauce and paste. Regular tomato and vegetable juices. °Fruits °Dried fruits. Canned fruit in light or heavy syrup. Fruit juice. °Meat and Other Protein Products °Fatty cuts of meat. Ribs, chicken wings, bacon, sausage, bologna, salami, chitterlings, fatback, hot dogs, bratwurst, and packaged luncheon meats. Salted nuts and seeds. Canned beans with salt. °Dairy °Whole or 2% milk, cream, half-and-half, and cream cheese. Whole-fat or sweetened yogurt. Full-fat   cheeses or blue cheese. Nondairy creamers and whipped toppings. Processed cheese, cheese spreads, or cheese  curds. °Condiments °Onion and garlic salt, seasoned salt, table salt, and sea salt. Canned and packaged gravies. Worcestershire sauce. Tartar sauce. Barbecue sauce. Teriyaki sauce. Soy sauce, including reduced sodium. Steak sauce. Fish sauce. Oyster sauce. Cocktail sauce. Horseradish. Ketchup and mustard. Meat flavorings and tenderizers. Bouillon cubes. Hot sauce. Tabasco sauce. Marinades. Taco seasonings. Relishes. °Fats and Oils °Butter, stick margarine, lard, shortening, ghee, and bacon fat. Coconut, palm kernel, or palm oils. Regular salad dressings. °Other °Pickles and olives. Salted popcorn and pretzels. °The items listed above may not be a complete list of foods and beverages to avoid. Contact your dietitian for more information. °WHERE CAN I FIND MORE INFORMATION? °National Heart, Lung, and [employer] Institute: www.nhlbi.nih.gov/health/health-topics/topics/dash/ °Document Released: 06/16/2011 Document Revised: 11/11/2013 Document Reviewed: 05/01/2013 °[employer]® Patient Information ©2015 [employer]. This information is not intended to replace advice given to you by your health care provider. Make sure you discuss any questions you have with your health care provider. ° °

## 2014-10-20 NOTE — Progress Notes (Signed)
MRN: [id number] Name: Kristen Chavez  Sex: female Age: 64 y.o. DOB: [birth year]  Allergies: Review of patient's allergies indicates no known allergies.  Chief Complaint  Patient presents with  . Follow-up    HPI: Patient is 64 y.o. female who history of hypertension, osteoarthritis, anxiety depression, patient is requesting refill on her medications, apparently she was getting Celexa and Wellbutrin from her psychiatrist, counseled patient to follow with her psychiatrist, she will be given one time refill, she also has followed up with sports medicine has history of osteoarthritis, apparently she had some fluid drainage done, she was prescribed meloxicam to take for pain as needed, today her blood pressure is borderline elevated, denies any headache dizziness chest and shortness of breath.  Past Medical History  Diagnosis Date  . Hypertension   . Arthritis     osteoarthritis.    Past Surgical History  Procedure Laterality Date  . Tonsillectomy        Medication List       This list is accurate as of: 10/20/14  5:00 PM.  Always use your most recent med list.               buPROPion 300 MG 24 hr tablet  Commonly known as:  WELLBUTRIN XL  Take 1 tablet (300 mg total) by mouth daily.     citalopram 40 MG tablet  Commonly known as:  CELEXA  Take 1 tablet (40 mg total) by mouth daily.     clonazePAM 0.5 MG tablet  Commonly known as:  KLONOPIN  Take by mouth.     HYDROcodone-acetaminophen 5-500 MG per tablet  Commonly known as:  VICODIN  Take by mouth.     ibuprofen 600 MG tablet  Commonly known as:  ADVIL,MOTRIN  Take 600 mg by mouth.     lisinopril-hydrochlorothiazide 20-25 MG per tablet  Commonly known as:  ZESTORETIC  Take 1 tablet by mouth daily.     meloxicam 15 MG tablet  Commonly known as:  MOBIC  Take one tablet by mouth every day for 5 days, then as needed.  Take with food.     multivitamin with minerals Tabs tablet  Take 1 tablet by mouth daily.       naproxen 375 MG tablet  Commonly known as:  NAPROSYN  Take 1 tablet (375 mg total) by mouth 2 (two) times daily.     naproxen 500 MG tablet  Commonly known as:  NAPROSYN  Take 1 tablet (500 mg total) by mouth 2 (two) times daily with a meal.     traMADol 50 MG tablet  Commonly known as:  ULTRAM  Take 1 tablet (50 mg total) by mouth every 6 (six) hours as needed.        Meds ordered this encounter  Medications  . buPROPion (WELLBUTRIN XL) 300 MG 24 hr tablet    Sig: Take 1 tablet (300 mg total) by mouth daily.    Dispense:  30 tablet    Refill:  0  . citalopram (CELEXA) 40 MG tablet    Sig: Take 1 tablet (40 mg total) by mouth daily.    Dispense:  30 tablet    Refill:  0  . lisinopril-hydrochlorothiazide (ZESTORETIC) 20-25 MG per tablet    Sig: Take 1 tablet by mouth daily.    Dispense:  30 tablet    Refill:  3    Immunization History  Administered Date(s) Administered  . Influenza,inj,Quad PF,36+ Mos 08/26/2013, 07/15/2014  Family History  Problem Relation Age of Onset  . Hypertension Mother   . Rheum arthritis Mother   . Diabetes Paternal Grandmother   . Diabetes Paternal Grandfather   . Other Father     suicide    History  Substance Use Topics  . Smoking status: Current Every Day Smoker    Last Attempt to Quit: 04/14/2014  . Smokeless tobacco: Not on file  . Alcohol Use: 0.0 oz/week    0 Standard drinks or equivalent per week     Comment: socially wine    Review of Systems   As noted in HPI  Filed Vitals:   10/20/14 1626  BP: 146/98  Pulse: 82  Temp: 98 F (36.7 C)  Resp: 16    Physical Exam  Physical Exam  Constitutional: No distress.  Eyes: EOM are normal. Pupils are equal, round, and reactive to light.  Cardiovascular: Normal rate and regular rhythm.   Pulmonary/Chest: Breath sounds normal. No respiratory distress. She has no wheezes. She has no rales.  Musculoskeletal:   left knee minimal swelling no tenderness, crepitation+     CBC    Component Value Date/Time   WBC 6.9 08/26/2013 1235   RBC 4.81 08/26/2013 1235   HGB 12.6 08/26/2013 1235   HCT 38.0 08/26/2013 1235   PLT 288 08/26/2013 1235   MCV 79.0 08/26/2013 1235   LYMPHSABS 2.4 08/26/2013 1235   MONOABS 0.6 08/26/2013 1235   EOSABS 0.1 08/26/2013 1235   BASOSABS 0.1 08/26/2013 1235    CMP     Component Value Date/Time   NA 141 07/15/2014 1157   K 4.2 07/15/2014 1157   CL 104 07/15/2014 1157   CO2 27 07/15/2014 1157   GLUCOSE 85 07/15/2014 1157   BUN 20 07/15/2014 1157   CREATININE 0.85 07/15/2014 1157   CREATININE 0.74 07/19/2013 1132   CALCIUM 9.7 07/15/2014 1157   PROT 7.4 07/15/2014 1157   ALBUMIN 4.4 07/15/2014 1157   AST 19 07/15/2014 1157   ALT 16 07/15/2014 1157   ALKPHOS 77 07/15/2014 1157   BILITOT 0.3 07/15/2014 1157   GFRNONAA 72 07/15/2014 1157   GFRNONAA 88* 07/19/2013 1132   GFRAA 83 07/15/2014 1157   GFRAA >90 07/19/2013 1132    Lab Results  Component Value Date/Time   CHOL 163 08/26/2013 12:35 PM    No results found for: HGBA1C  Lab Results  Component Value Date/Time   AST 19 07/15/2014 11:57 AM    Assessment and Plan  Essential hypertension - Plan:advised patient for DASH diet continue with  lisinopril-hydrochlorothiazide (ZESTORETIC) 20-25 MG per tablet, we'll check blood chemistry on the following visit.  Osteoarthritis of left knee, unspecified osteoarthritis type Patient to follow with sports medicine.  Anxiety and depression - Plan: patient is given one time refill  buPROPion (WELLBUTRIN XL) 300 MG 24 hr tablet, citalopram (CELEXA) 40 MG tablet, she will schedule appointment with her psychiatrist who has been prescribing her these medications.   Return in about 3 months (around 01/19/2015), or if symptoms worsen or fail to improve, for hypertension.   This note has been created with Surveyor, quantity. Any transcriptional errors are unintentional.     [name], MD

## 2014-11-17 ENCOUNTER — Telehealth: Payer: Self-pay | Admitting: Internal Medicine

## 2014-11-17 ENCOUNTER — Telehealth: Payer: Self-pay

## 2014-11-17 NOTE — Telephone Encounter (Signed)
Pt has been receiving refills on mental health medications from [facility] but their next available appt for pt is not until August.  Pt is worried because she is running out of medications and says she cannot go with taking meds. Please f/u with pt.

## 2014-11-17 NOTE — Telephone Encounter (Signed)
Patient called requesting refills on her celexa and wellbutrin Patient can not get into [facility] until August Can we refill these

## 2014-11-18 ENCOUNTER — Telehealth: Payer: Self-pay

## 2014-11-18 DIAGNOSIS — F419 Anxiety disorder, unspecified: Principal | ICD-10-CM

## 2014-11-18 DIAGNOSIS — F329 Major depressive disorder, single episode, unspecified: Secondary | ICD-10-CM

## 2014-11-18 MED ORDER — CITALOPRAM HYDROBROMIDE 40 MG PO TABS: 40.0000 mg | ORAL_TABLET | Freq: Every day | ORAL | Status: DC

## 2014-11-18 MED ORDER — BUPROPION HCL ER (XL) 300 MG PO TB24: 300.0000 mg | ORAL_TABLET | Freq: Every day | ORAL | Status: DC

## 2014-11-18 NOTE — Telephone Encounter (Signed)
Patient called requesting a refill on her celexa and wellbutrin Per dr Annitta Needs we can refill for one month

## 2015-02-27 ENCOUNTER — Other Ambulatory Visit: Payer: Self-pay | Admitting: Sports Medicine

## 2015-04-20 ENCOUNTER — Other Ambulatory Visit: Payer: Self-pay | Admitting: Internal Medicine

## 2015-04-20 NOTE — Telephone Encounter (Signed)
Patient called and requested a med refill for lisinopril-hydrochlorothiazide (ZESTORETIC) 20-25 MG per tablet. Please f/u with pt.

## 2015-06-01 ENCOUNTER — Ambulatory Visit: Payer: Medicare Other | Admitting: Sports Medicine

## 2015-06-08 ENCOUNTER — Ambulatory Visit (INDEPENDENT_AMBULATORY_CARE_PROVIDER_SITE_OTHER): Payer: Medicare Other | Admitting: Sports Medicine

## 2015-06-08 ENCOUNTER — Encounter: Payer: Self-pay | Admitting: Sports Medicine

## 2015-06-08 VITALS — BP 146/90 | Ht 65.0 in | Wt 157.0 lb

## 2015-06-08 DIAGNOSIS — M170 Bilateral primary osteoarthritis of knee: Secondary | ICD-10-CM | POA: Diagnosis not present

## 2015-06-08 DIAGNOSIS — M25562 Pain in left knee: Secondary | ICD-10-CM | POA: Diagnosis not present

## 2015-06-08 DIAGNOSIS — M25561 Pain in right knee: Secondary | ICD-10-CM | POA: Diagnosis present

## 2015-06-08 MED ORDER — METHYLPREDNISOLONE ACETATE 40 MG/ML IJ SUSP
40.0000 mg | Freq: Once | INTRAMUSCULAR | Status: AC
  Administered 2015-06-08: 40 mg via INTRA_ARTICULAR

## 2015-06-08 NOTE — Progress Notes (Signed)
Subjective:    Patient ID: Kristen Chavez, female    DOB: 1951-04-29, 64 y.o.   MRN: [id number]  HPI  chief complaint: Bilateral knee pain  Very pleasant 64 year old female with known bilateral knee DJD comes in today complaining of worsening pain over the past 6 months. She denies any recent trauma but rather a gradual onset of pain that is worse with activity. She gets intermittent swelling as well. She has been on meloxicam in the past with good results but she does not take it daily. She was also given a body helix compression sleeve earlier this year which has been helpful. Her last x-rays were in 2010. She is here today not only to discuss treatment for her current knee pain but also to discuss definitive treatment for her ongoing knee problems. She does get some weakness. Some feelings of instability. She feels like the knee wants to give way at times. She's getting pain at rest as well. No fevers or chills.  Interim medical history reviewed Medications reviewed Social history reviewed. Allergies reviewed    Review of Systems As above    Objective:   Physical Exam Well-developed, well-nourished. No acute distress. Awake alert and oriented 3. Vital signs reviewed.  Right knee: Range of motion from 0-120. Trace to 1+ effusion. 1+ patellofemoral crepitus. She is tender to palpation along the medial and lateral joint lines. Negative McMurray's. Good joint stability. Neurovascularly intact distally.  Left knee: Patient has about a 15 extension lag on the left. Flexion to 120. Trace to 1+ effusion. 1+ patellofemoral crepitus. Tender to palpation along the medial and lateral joint lines. Negative McMurray's. Good joint stability. Neurovascularly intact distally.  Overall alignment is pretty good. Patient walks with a limp.  X-rays from 2010 of the left knee are reviewed. Patient had moderately advanced degenerative changes and a large loose body in the posterior knee.  A very  limited ultrasound of her right knee shows a small joint effusion with degenerative changes along the medial joint line including an extruded medial meniscus.       Assessment & Plan:  Bilateral knee pain secondary to DJD with prior x-ray evidence of a large loose body in the left knee  For her current pain I recommend cortisone injections. Patient agrees. I will give her a new body helix compression sleeve as well. I want to get some updated x-rays of both knees and have the patient follow-up with me in 3 weeks. She understands that definitive treatment is a total knee arthroplasty and I did discuss the possibility of a referral to Dr. Mayer Camel to discuss this further but her current job situation does not allow for her to take much time off. Her x-ray from 2010 of her left knee showed a large loose body in the posterior knee so an arthroscopy may be of some benefit to her but again I think definitive treatment for both knees is a total knee arthroplasty. She may continue with her as needed meloxicam.  Consent obtained and verified. Time-out conducted. Noted no overlying erythema, induration, or other signs of local infection. Skin prepped in a sterile fashion. Topical analgesic spray: Ethyl chloride. Joint: right knee Needle: 25g 1.5 inch Completed without difficulty. Meds: 3cc 1% xylocaine, 1cc ('40mg'$ ) depomedrol  Advised to call if fevers/chills, erythema, induration, drainage, or persistent bleeding.   Consent obtained and verified. Time-out conducted. Noted no overlying erythema, induration, or other signs of local infection. Skin prepped in a sterile fashion. Topical analgesic spray:  Ethyl chloride. Joint: left knee Needle: 25g 1.5 inch Completed without difficulty. Meds: 3cc 1% xylocaine, 1cc ('40mg'$ ) depomedrol  Advised to call if fevers/chills, erythema, induration, drainage, or persistent bleeding.

## 2015-06-26 ENCOUNTER — Ambulatory Visit: Payer: Medicare Other | Attending: Internal Medicine | Admitting: Internal Medicine

## 2015-06-26 ENCOUNTER — Encounter: Payer: Self-pay | Admitting: Internal Medicine

## 2015-06-26 VITALS — BP 132/88 | HR 74 | Temp 98.2°F | Resp 18 | Ht 65.0 in | Wt 157.0 lb

## 2015-06-26 DIAGNOSIS — Z0000 Encounter for general adult medical examination without abnormal findings: Secondary | ICD-10-CM | POA: Insufficient documentation

## 2015-06-26 DIAGNOSIS — M545 Low back pain: Secondary | ICD-10-CM | POA: Diagnosis not present

## 2015-06-26 DIAGNOSIS — N939 Abnormal uterine and vaginal bleeding, unspecified: Secondary | ICD-10-CM | POA: Diagnosis not present

## 2015-06-26 DIAGNOSIS — I10 Essential (primary) hypertension: Secondary | ICD-10-CM | POA: Diagnosis not present

## 2015-06-26 DIAGNOSIS — K921 Melena: Secondary | ICD-10-CM | POA: Insufficient documentation

## 2015-06-26 DIAGNOSIS — Z79899 Other long term (current) drug therapy: Secondary | ICD-10-CM | POA: Diagnosis not present

## 2015-06-26 DIAGNOSIS — Z1211 Encounter for screening for malignant neoplasm of colon: Secondary | ICD-10-CM | POA: Insufficient documentation

## 2015-06-26 DIAGNOSIS — M1990 Unspecified osteoarthritis, unspecified site: Secondary | ICD-10-CM | POA: Insufficient documentation

## 2015-06-26 DIAGNOSIS — F17210 Nicotine dependence, cigarettes, uncomplicated: Secondary | ICD-10-CM | POA: Insufficient documentation

## 2015-06-26 DIAGNOSIS — R102 Pelvic and perineal pain: Secondary | ICD-10-CM | POA: Insufficient documentation

## 2015-06-26 LAB — HEMOCCULT GUIAC POC 1CARD (OFFICE): Fecal Occult Blood, POC: NEGATIVE

## 2015-06-26 LAB — POCT URINALYSIS DIPSTICK
BILIRUBIN UA: NEGATIVE
Blood, UA: NEGATIVE
Glucose, UA: NEGATIVE
KETONES UA: NEGATIVE
LEUKOCYTES UA: NEGATIVE
NITRITE UA: NEGATIVE
PH UA: 6
PROTEIN UA: NEGATIVE
Spec Grav, UA: 1.025
Urobilinogen, UA: 0.2

## 2015-06-26 NOTE — Progress Notes (Signed)
Patient ID: Kristen Chavez, female   DOB: [birth year], 64 y.o.   MRN: [id number]  CC: hematuria, blood in stool  HPI: Kristen Chavez is a 64 y.o. female here today for a follow up visit.  Patient has past medical history of hypertension and arthritis. Patient reports that she has been postmenopausal for 20 years and is concerned because she noticed some blood in the commode and with wiping 1.5 weeks ago. Blood is pink when she visualizes it. She feels like it is coming from her urine. She has never noticed blood in her underwear. 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 She admits to some lower back pain and left pelvic pain. 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 Some incontinence issues in her sleep recently.  Patient also reports that she noticed blood in her stool 3-4 months ago but has not noticed any since that one episode. She has never had a colonoscopy before.    No Known Allergies Past Medical History  Diagnosis Date  . Hypertension   . Arthritis     osteoarthritis.   Current Outpatient Prescriptions on File Prior to Visit  Medication Sig Dispense Refill  . buPROPion (WELLBUTRIN XL) 300 MG 24 hr tablet Take 1 tablet (300 mg total) by mouth daily. 30 tablet 0  . citalopram (CELEXA) 40 MG tablet Take 1 tablet (40 mg total) by mouth daily. 30 tablet 0  . ibuprofen (ADVIL,MOTRIN) 600 MG tablet Take 600 mg by mouth.    Marland Kitchen lisinopril-hydrochlorothiazide (PRINZIDE,ZESTORETIC) 20-25 MG tablet TAKE 1 TABLET BY MOUTH ONCE DAILY 30 tablet 3  . meloxicam (MOBIC) 15 MG tablet TAKE ONE TABLET BY MOUTH EVERY DAY FOR 5 DAYS, THEN AS NEEDED. TAKE WITH FOOD. 40 tablet 1  . Multiple Vitamin (MULTIVITAMIN WITH MINERALS) TABS tablet Take 1 tablet by mouth daily.    . clonazePAM (KLONOPIN) 0.5 MG tablet Take by mouth. Reported on 06/26/2015    . HYDROcodone-acetaminophen (VICODIN) 5-500 MG per tablet Take by  mouth. Reported on 06/26/2015    . Hydrocodone-Acetaminophen 5-300 MG TABS Take by mouth. Reported on 06/26/2015    . naproxen (NAPROSYN) 375 MG tablet Take 1 tablet (375 mg total) by mouth 2 (two) times daily. (Patient not taking: Reported on 06/26/2015) 20 tablet 0  . naproxen (NAPROSYN) 500 MG tablet Take 1 tablet (500 mg total) by mouth 2 (two) times daily with a meal. (Patient not taking: Reported on 06/26/2015) 30 tablet 0  . traMADol (ULTRAM) 50 MG tablet Take 1 tablet (50 mg total) by mouth every 6 (six) hours as needed. (Patient not taking: Reported on 06/26/2015) 30 tablet 0  . traZODone (DESYREL) 50 MG tablet Reported on 06/26/2015  2   No current facility-administered medications on file prior to visit.   Family History  Problem Relation Age of Onset  . Hypertension Mother   . Rheum arthritis Mother   . Diabetes Paternal Grandmother   . Diabetes Paternal Grandfather   . Other Father     suicide   Social History   Social History  . Marital Status: Single    Spouse Name: N/A  . Number of Children: N/A  . Years of Education: N/A   Occupational History  . Not on file.   Social History Main Topics  . Smoking status: Current Every Day Smoker -- 0.25 packs/day    Types: Cigarettes    Last  Attempt to Quit: 04/14/2014  . Smokeless tobacco: Not on file  . Alcohol Use: 0.0 oz/week    0 Standard drinks or equivalent per week     Comment: socially wine  . Drug Use: No  . Sexual Activity: Not on file   Other Topics Concern  . Not on file   Social History Narrative    Review of Systems: Other than what is stated in HPI, all other systems are negative.   Objective:   Filed Vitals:   06/26/15 1104  BP: 132/88  Pulse: 74  Temp: 98.2 F (36.8 C)  Resp: 18    Physical Exam  Constitutional: She is oriented to person, place, and time.  Cardiovascular: Normal rate, regular rhythm and normal heart sounds.   Pulmonary/Chest: Effort normal and breath sounds normal.   Abdominal: Soft. Bowel sounds are normal. She exhibits no distension. Tenderness: pelvic. Hernia confirmed negative in the right inguinal area and confirmed negative in the left inguinal area.  Genitourinary: Vagina normal and uterus normal. Cervix exhibits no motion tenderness, no discharge and no friability. Right adnexum displays no tenderness. Left adnexum displays tenderness.  No blood noticed in vaginal cavity  Lymphadenopathy:       Right: No inguinal adenopathy present.       Left: No inguinal adenopathy present.  Neurological: She is alert and oriented to person, place, and time.     Lab Results  Component Value Date   WBC 6.9 08/26/2013   HGB 12.6 08/26/2013   HCT 38.0 08/26/2013   MCV 79.0 08/26/2013   PLT 288 08/26/2013   Lab Results  Component Value Date   CREATININE 0.85 07/15/2014   BUN 20 07/15/2014   NA 141 07/15/2014   K 4.2 07/15/2014   CL 104 07/15/2014   CO2 27 07/15/2014    No results found for: HGBA1C Lipid Panel     Component Value Date/Time   CHOL 163 08/26/2013 1235   TRIG 84 08/26/2013 1235   HDL 76 08/26/2013 1235   CHOLHDL 2.1 08/26/2013 1235   VLDL 17 08/26/2013 1235   LDLCALC 70 08/26/2013 1235       Assessment and plan:   Kristen Chavez was seen today for establish care.  Diagnoses and all orders for this visit:  Vaginal bleeding -     US Pelvis Complete; Future -     US Transvaginal Non-OB; Future -     POCT urinalysis dipstick---negative for blood At this point I an unsure if bleeding is in urine or from vagina. I will start by doing a ultrasound to see if we noticed anything abnormal. If not then I will refer patient to Urology. Last pap was negative and if ultrasound negative I do not believe she will require GYN.  Blood in stool -     Hemoccult - 1 Card (office)---negative Will refer for Colonoscopy  Healthcare maintenance -     Flu Vaccine QUAD 36+ mos PF IM (Fluarix & Fluzone Quad PF)  Colon cancer screening -     Ambulatory  referral to Gastroenterology   Return if symptoms worsen or fail to improve.       Lance Bosch, [place] [facility] [phone] 06/26/2015, 11:25 AM /--

## 2015-06-26 NOTE — Progress Notes (Signed)
Patient establishing care. Patient here for hematuria.  Patient denies pain at this time  Patient states two weeks ago while urinating she witnessed blood on her tissue after wiping as well as blood being in the toilet bowel. Patient states blood was bright red and covered more than 50% of the tissue. Patient noticed at an earlier time while having a bowel movement she had blood in the toilet bowel. Patient states she had a pain in her lower back and lower abdomen prior to seeing the blood.   Patient complains of finger and toe numbness.  Patient would like her cholesterol checked.

## 2015-07-01 ENCOUNTER — Telehealth: Payer: Self-pay

## 2015-07-01 ENCOUNTER — Telehealth: Payer: Self-pay | Admitting: Internal Medicine

## 2015-07-01 NOTE — Telephone Encounter (Signed)
Returned patient phone call Patient was inquiring about her lab results from her last visit i explained that she did not have any labs drawn  Patient was satisfied with that

## 2015-07-01 NOTE — Telephone Encounter (Signed)
PT. Called requesting her results. Please f/u with pt.

## 2015-07-01 NOTE — Telephone Encounter (Signed)
error 

## 2015-07-02 ENCOUNTER — Ambulatory Visit: Payer: Medicare Other | Admitting: Sports Medicine

## 2015-07-15 ENCOUNTER — Telehealth: Payer: Self-pay

## 2015-07-15 ENCOUNTER — Ambulatory Visit (HOSPITAL_COMMUNITY)
Admission: RE | Admit: 2015-07-15 | Discharge: 2015-07-15 | Disposition: A | Discharge status: Home / Self Care | Payer: Medicare Other | Source: Ambulatory Visit | Attending: Internal Medicine | Admitting: Internal Medicine

## 2015-07-15 DIAGNOSIS — N939 Abnormal uterine and vaginal bleeding, unspecified: Secondary | ICD-10-CM | POA: Insufficient documentation

## 2015-07-15 DIAGNOSIS — N950 Postmenopausal bleeding: Secondary | ICD-10-CM | POA: Diagnosis not present

## 2015-07-15 NOTE — Telephone Encounter (Signed)
Pt. Called requesting her results from her ultrasound. Please f/u

## 2015-07-15 NOTE — Telephone Encounter (Signed)
Returned patient phone call Patient had left message earlier about her [place] results Patient is aware both [place] were negative Patient did state she is only having a little bit of spotting Prefers to wait for urology Will call back if she experiences bleeding again and at that time Referral to urology will be placed in epic

## 2015-07-15 NOTE — Telephone Encounter (Signed)
-----   Message from Lance Bosch, NP sent at 07/15/2015 11:05 AM EST ----- Both ultrasounds were negative. Has she noticed anymore blood when using restroom. The only other option is place a referral to Urology. Please place order

## 2015-07-21 ENCOUNTER — Other Ambulatory Visit: Payer: Self-pay

## 2015-07-21 ENCOUNTER — Other Ambulatory Visit: Payer: Self-pay | Admitting: *Deleted

## 2015-07-21 DIAGNOSIS — F419 Anxiety disorder, unspecified: Principal | ICD-10-CM

## 2015-07-21 DIAGNOSIS — F329 Major depressive disorder, single episode, unspecified: Secondary | ICD-10-CM

## 2015-07-21 MED ORDER — LISINOPRIL-HYDROCHLOROTHIAZIDE 20-25 MG PO TABS: 1.0000 | ORAL_TABLET | Freq: Every day | ORAL | Status: DC

## 2015-07-21 MED ORDER — CITALOPRAM HYDROBROMIDE 40 MG PO TABS: 40.0000 mg | ORAL_TABLET | Freq: Every day | ORAL | Status: DC

## 2015-07-21 MED ORDER — BUPROPION HCL ER (XL) 300 MG PO TB24: 300.0000 mg | ORAL_TABLET | Freq: Every day | ORAL | Status: DC

## 2015-07-21 MED ORDER — MELOXICAM 15 MG PO TABS: ORAL_TABLET | ORAL | Status: DC

## 2015-07-21 MED FILL — BUPROPION HCL XL 300 MG TAB: 300 | 30 days supply | Qty: 30 | Fill #0

## 2015-07-21 MED FILL — MELOXICAM 15 MG TABLET: 15 | 60 days supply | Qty: 60 | Fill #0

## 2015-07-21 MED FILL — LISINOPRIL-HCTZ 20-25 MG TA: 20-25 | 30 days supply | Qty: 30 | Fill #0

## 2015-07-21 MED FILL — CITALOPRAM HBR 40 MG TABLET: 40 | 30 days supply | Qty: 30 | Fill #0

## 2015-07-28 ENCOUNTER — Ambulatory Visit: Payer: Medicare Other | Admitting: Internal Medicine

## 2015-08-10 ENCOUNTER — Ambulatory Visit: Payer: Medicare Other | Admitting: Internal Medicine

## 2015-08-14 ENCOUNTER — Telehealth: Payer: Self-pay | Admitting: Internal Medicine

## 2015-08-14 NOTE — Telephone Encounter (Signed)
Patient No Showed doctor visit

## 2015-08-28 MED FILL — CITALOPRAM HBR 40 MG TABLET: 40 | 30 days supply | Qty: 30 | Fill #1

## 2015-08-28 MED FILL — traZODone HCL 50 MG TABS: 50 | 30 days supply | Qty: 30 | Fill #1

## 2015-08-28 MED FILL — LISINOPRIL-HCTZ 20-25 MG TA: 20-25 | 30 days supply | Qty: 30 | Fill #1

## 2015-08-28 MED FILL — LISINOPRIL-HCTZ 20-25 MG TA: 20-25 | 30 days supply | Qty: 30 | Fill #2

## 2015-08-28 MED FILL — ?BUPROPION HCL XL 300 MG TA: 300 | 30 days supply | Qty: 30 | Fill #1

## 2015-09-08 ENCOUNTER — Other Ambulatory Visit: Payer: Self-pay | Admitting: *Deleted

## 2015-09-08 MED ORDER — MELOXICAM 15 MG PO TABS: ORAL_TABLET | ORAL | Status: DC

## 2015-09-08 MED FILL — MELOXICAM 15 MG TABLET: 15 | 20 days supply | Qty: 20 | Fill #0

## 2015-09-11 ENCOUNTER — Ambulatory Visit: Payer: Medicare Other | Admitting: Internal Medicine

## 2015-09-14 ENCOUNTER — Ambulatory Visit: Payer: Medicare Other | Admitting: Internal Medicine

## 2015-09-18 ENCOUNTER — Ambulatory Visit: Payer: Medicare Other | Admitting: Sports Medicine

## 2015-10-07 MED FILL — ?BUPROPION HCL XL 300 MG TA: 300 | 30 days supply | Qty: 30 | Fill #2

## 2015-10-07 MED FILL — CITALOPRAM HBR 40 MG TABLET: 40 | 30 days supply | Qty: 30 | Fill #2

## 2015-10-07 MED FILL — LISINOPRIL-HCTZ 20-25 MG TA: 20-25 | 30 days supply | Qty: 30 | Fill #3

## 2015-10-07 MED FILL — MELOXICAM 15 MG TABLET: 15 | 60 days supply | Qty: 60 | Fill #1

## 2015-11-26 ENCOUNTER — Telehealth: Payer: Self-pay | Admitting: Family Medicine

## 2015-11-26 NOTE — Telephone Encounter (Signed)
Pt calling requesting a Rx Refill for Celexa '40mg'$   ,Wellbutrin xl '30mg'$  ,Lisinopril & Meloxicam q5 mg . Pt use [facility] pt is out and need her psychiatry medicine a soon as possible . Please, call her thank you .

## 2015-11-30 ENCOUNTER — Other Ambulatory Visit: Payer: Self-pay | Admitting: Family Medicine

## 2015-11-30 DIAGNOSIS — F419 Anxiety disorder, unspecified: Principal | ICD-10-CM

## 2015-11-30 DIAGNOSIS — F329 Major depressive disorder, single episode, unspecified: Secondary | ICD-10-CM

## 2015-11-30 MED ORDER — BUPROPION HCL ER (XL) 300 MG PO TB24: 300.0000 mg | ORAL_TABLET | Freq: Every day | ORAL | Status: DC

## 2015-11-30 MED ORDER — LISINOPRIL-HYDROCHLOROTHIAZIDE 20-25 MG PO TABS: 1.0000 | ORAL_TABLET | Freq: Every day | ORAL | Status: DC

## 2015-11-30 MED ORDER — CITALOPRAM HYDROBROMIDE 40 MG PO TABS: 40.0000 mg | ORAL_TABLET | Freq: Every day | ORAL | Status: DC

## 2015-11-30 NOTE — Telephone Encounter (Signed)
Patient is following up on request  For psychiatry medication..  Due to work patient has not been able to go and see a psychiatrist  Please follow up with patient

## 2015-11-30 NOTE — Telephone Encounter (Signed)
30 day supply of medications done. For meloxicam she obtained it from an outside clinician and will need an office visit as documented on Epic as per prescriber.

## 2016-01-20 ENCOUNTER — Encounter (HOSPITAL_COMMUNITY): Payer: Self-pay | Admitting: Emergency Medicine

## 2016-01-20 ENCOUNTER — Emergency Department (HOSPITAL_COMMUNITY)
Admission: EM | Admit: 2016-01-20 | Discharge: 2016-01-20 | Disposition: A | Discharge status: Home / Self Care | Payer: Medicare Other | Attending: Emergency Medicine | Admitting: Emergency Medicine

## 2016-01-20 DIAGNOSIS — M1990 Unspecified osteoarthritis, unspecified site: Secondary | ICD-10-CM | POA: Diagnosis not present

## 2016-01-20 DIAGNOSIS — I10 Essential (primary) hypertension: Secondary | ICD-10-CM | POA: Diagnosis not present

## 2016-01-20 DIAGNOSIS — F32A Depression, unspecified: Secondary | ICD-10-CM

## 2016-01-20 DIAGNOSIS — R45851 Suicidal ideations: Secondary | ICD-10-CM | POA: Diagnosis present

## 2016-01-20 DIAGNOSIS — F329 Major depressive disorder, single episode, unspecified: Secondary | ICD-10-CM

## 2016-01-20 HISTORY — DX: Depression, unspecified: F32.A

## 2016-01-20 HISTORY — DX: Major depressive disorder, single episode, unspecified: F32.9

## 2016-01-20 HISTORY — DX: Anxiety disorder, unspecified: F41.9

## 2016-01-20 LAB — COMPREHENSIVE METABOLIC PANEL
ALT: 18 U/L (ref 14–54)
AST: 21 U/L (ref 15–41)
Albumin: 4.3 g/dL (ref 3.5–5.0)
Alkaline Phosphatase: 77 U/L (ref 38–126)
Anion gap: 8 (ref 5–15)
BUN: 14 mg/dL (ref 6–20)
CHLORIDE: 106 mmol/L (ref 101–111)
CO2: 24 mmol/L (ref 22–32)
CREATININE: 0.88 mg/dL (ref 0.44–1.00)
Calcium: 9.1 mg/dL (ref 8.9–10.3)
GFR calc non Af Amer: >60 mL/min (ref >60)
Glucose, Bld: 97 mg/dL (ref 65–99)
POTASSIUM: 3.7 mmol/L (ref 3.5–5.1)
SODIUM: 138 mmol/L (ref 135–145)
Total Bilirubin: 0.4 mg/dL (ref 0.3–1.2)
Total Protein: 7.4 g/dL (ref 6.5–8.1)

## 2016-01-20 LAB — CBC
HCT: 35.7 % — ABNORMAL LOW (ref 36.0–46.0)
HEMOGLOBIN: 11.7 g/dL — AB (ref 12.0–15.0)
MCH: 26.2 pg (ref 26.0–34.0)
MCHC: 32.8 g/dL (ref 30.0–36.0)
MCV: 80 fL (ref 78.0–100.0)
Platelets: 231 10*3/uL (ref 150–400)
RBC: 4.46 MIL/uL (ref 3.87–5.11)
RDW: 15.3 % (ref 11.5–15.5)
WBC: 6.5 10*3/uL (ref 4.0–10.5)

## 2016-01-20 LAB — ETHANOL: Alcohol, Ethyl (B): <5 mg/dL (ref <5)

## 2016-01-20 LAB — SALICYLATE LEVEL

## 2016-01-20 LAB — ACETAMINOPHEN LEVEL: Acetaminophen (Tylenol), Serum: <10 ug/mL — ABNORMAL LOW (ref 10–30)

## 2016-01-20 MED ORDER — CITALOPRAM HYDROBROMIDE 10 MG PO TABS: 40.0000 mg | ORAL_TABLET | Freq: Every day | ORAL | Status: DC

## 2016-01-20 MED ORDER — TRAZODONE HCL 50 MG PO TABS: 50.0000 mg | ORAL_TABLET | Freq: Every evening | ORAL | Status: DC | PRN

## 2016-01-20 MED ORDER — ZOLPIDEM TARTRATE 5 MG PO TABS: 5.0000 mg | ORAL_TABLET | Freq: Every evening | ORAL | Status: DC | PRN

## 2016-01-20 MED ORDER — IBUPROFEN 200 MG PO TABS: 600.0000 mg | ORAL_TABLET | Freq: Three times a day (TID) | ORAL | Status: DC | PRN

## 2016-01-20 MED ORDER — LISINOPRIL-HYDROCHLOROTHIAZIDE 20-25 MG PO TABS: 1.0000 | ORAL_TABLET | Freq: Every day | ORAL | Status: DC

## 2016-01-20 MED ORDER — ONDANSETRON HCL 4 MG PO TABS: 4.0000 mg | ORAL_TABLET | Freq: Three times a day (TID) | ORAL | Status: DC | PRN

## 2016-01-20 MED ORDER — CITALOPRAM HYDROBROMIDE 40 MG PO TABS: 40.0000 mg | ORAL_TABLET | Freq: Every day | ORAL | Status: DC

## 2016-01-20 MED ORDER — ACETAMINOPHEN 325 MG PO TABS: 650.0000 mg | ORAL_TABLET | ORAL | Status: DC | PRN

## 2016-01-20 MED ORDER — BUPROPION HCL ER (XL) 150 MG PO TB24: 300.0000 mg | ORAL_TABLET | Freq: Every day | ORAL | Status: DC

## 2016-01-20 MED ORDER — BUPROPION HCL ER (XL) 300 MG PO TB24: 300.0000 mg | ORAL_TABLET | Freq: Every day | ORAL | Status: DC

## 2016-01-20 NOTE — BH Assessment (Addendum)
Tele Assessment Note   Kristen Chavez is an 65 y.o. female. Pt reports SI with no plan. Pt denies HI and AVH. Pt reports job loss and financial difficulties. Pt reports increased depression and SI. Pt reports 1 previous SI attempt. Pt reports marijuana use. Pt states she uses marijuana 1-2x a day. Pt denies abuse. Pt denies previous inpatient treatment. Pt reports outpatient treatment 6 months ago at [facility]. Pt states she was prescribed Wellbutrin and Celexa but stopped use 6 months ago. Pt reports family and friend support. According to the Pt, she is not seeking inpatient treatment. Pt states "I want someone to talk to."   Writer consulted with Theodoro Clock, [place]. Per [facility] Pt does not meet inpatient criteria. Pt provided with outpatient resources.   Diagnosis:  F33.2 MDD, recurrent, severe  Past Medical History:  Past Medical History  Diagnosis Date  . Hypertension   . Arthritis     osteoarthritis.  . Depression   . Anxiety     Past Surgical History  Procedure Laterality Date  . Tonsillectomy      Family History:  Family History  Problem Relation Age of Onset  . Hypertension Mother   . Rheum arthritis Mother   . Diabetes Paternal Grandmother   . Diabetes Paternal Grandfather   . Other Father     suicide    Social History:  reports that she quit smoking about 21 months ago. Her smoking use included Cigarettes. She smoked 0.25 packs per day. She does not have any smokeless tobacco history on file. She reports that she drinks about 12.6 oz of alcohol per week. She reports that she uses illicit drugs (Marijuana).  Additional Social History:  Alcohol / Drug Use Pain Medications: Pt denies Prescriptions: Pt denies Over the Counter: Pt denies History of alcohol / drug use?: Yes Longest period of sobriety (when/how long): unknown Substance #1 Name of Substance 1: Marijuana 1 - Age of First Use: unknown 1 - Amount (size/oz): "bowl" 1 - Frequency: 1-2x a week 1 - Duration:  ongoing 1 - Last Use / Amount: 01/19/16  CIWA:   COWS:    PATIENT STRENGTHS: (choose at least two) Average or above average intelligence Communication skills  Allergies: No Known Allergies  Home Medications:  (Not in a hospital admission)  OB/GYN Status:  No LMP recorded. Patient is postmenopausal.  General Assessment Data Location of Assessment: [facility] TTS Assessment: In system Is this a Tele or Face-to-Face Assessment?: Tele Assessment Is this an Initial Assessment or a Re-assessment for this encounter?: Initial Assessment Marital status: Single Maiden name: NA Is patient pregnant?: No Pregnancy Status: No Living Arrangements: Alone Can pt return to current living arrangement?: Yes Admission Status: Voluntary Is patient capable of signing voluntary admission?: Yes Referral Source: Self/Family/Friend Insurance type: Medicare     Crisis Care Plan Living Arrangements: Alone Legal Guardian: Other: (self) Name of Psychiatrist: NA Name of Therapist: NA  Education Status Is patient currently in school?: No Current Grade: NA Highest grade of school patient has completed: some college Name of school: Na Contact person: NA  Risk to self with the past 6 months Suicidal Ideation: Yes-Currently Present Has patient been a risk to self within the past 6 months prior to admission? : No Suicidal Intent: Yes-Currently Present Has patient had any suicidal intent within the past 6 months prior to admission? : No Is patient at risk for suicide?: Yes Suicidal Plan?: Yes-Currently Present Has patient had any suicidal plan within the past 6  months prior to admission? : Yes Specify Current Suicidal Plan: Pt will not specify Access to Means: No Specify Access to Suicidal Means: Pt denies What has been your use of drugs/alcohol within the last 12 months?: marijuan use Previous Attempts/Gestures: Yes How many times?: 1 Other Self Harm Risks: NA Triggers for Past Attempts: Other  (Comment) (loss of job) Intentional Self Injurious Behavior: None Family Suicide History: No Recent stressful life event(s): Job Loss, Financial Problems Persecutory voices/beliefs?: No Depression: Yes Depression Symptoms: Insomnia, Tearfulness, Fatigue, Loss of interest in usual pleasures, Feeling worthless/self pity, Feeling angry/irritable Substance abuse history and/or treatment for substance abuse?: No Suicide prevention information given to non-admitted patients: Not applicable  Risk to Others within the past 6 months Homicidal Ideation: No Does patient have any lifetime risk of violence toward others beyond the six months prior to admission? : No Thoughts of Harm to Others: No Current Homicidal Intent: No Current Homicidal Plan: No Access to Homicidal Means: No Identified Victim: NA History of harm to others?: No Assessment of Violence: None Noted Violent Behavior Description: NA Does patient have access to weapons?: No Criminal Charges Pending?: No Does patient have a court date: No Is patient on probation?: No  Psychosis Hallucinations: None noted Delusions: None noted  Mental Status Report Appearance/Hygiene: Unremarkable, In scrubs Eye Contact: Good Motor Activity: Freedom of movement Speech: Logical/coherent Level of Consciousness: Alert Mood: Depressed, Sad Affect: Depressed, Sad Anxiety Level: Moderate Thought Processes: Coherent, Relevant Judgement: Unimpaired Orientation: Person, Place, Time, Situation, Appropriate for developmental age Obsessive Compulsive Thoughts/Behaviors: None  Cognitive Functioning Concentration: Normal Memory: Recent Intact, Remote Intact IQ: Average Insight: Fair Impulse Control: Fair Appetite: Good Weight Loss: 0 Weight Gain: 0 Sleep: Decreased Total Hours of Sleep: 5 Vegetative Symptoms: None  ADLScreening [facility] Assessment Services) Patient's cognitive ability adequate to safely complete daily activities?:  Yes Patient able to express need for assistance with ADLs?: Yes Independently performs ADLs?: Yes (appropriate for developmental age)  Prior Inpatient Therapy Prior Inpatient Therapy: No Prior Therapy Dates: NA Prior Therapy Facilty/Provider(s): NA Reason for Treatment: NA  Prior Outpatient Therapy Prior Outpatient Therapy: Yes Prior Therapy Dates: 2016 Prior Therapy Facilty/Provider(s): [facility] Reason for Treatment: depression Does patient have an ACCT team?: No Does patient have Intensive In-House Services?  : No Does patient have [facility] services? : No (previous services) Does patient have P4CC services?: No  ADL Screening (condition at time of admission) Patient's cognitive ability adequate to safely complete daily activities?: Yes Is the patient deaf or have difficulty hearing?: No Does the patient have difficulty seeing, even when wearing glasses/contacts?: No Does the patient have difficulty concentrating, remembering, or making decisions?: No Patient able to express need for assistance with ADLs?: Yes Does the patient have difficulty dressing or bathing?: No Independently performs ADLs?: Yes (appropriate for developmental age) Does the patient have difficulty walking or climbing stairs?: No Weakness of Legs: None Weakness of Arms/Hands: None       Abuse/Neglect Assessment (Assessment to be complete while patient is alone) Physical Abuse: Denies Verbal Abuse: Denies Sexual Abuse: Denies Exploitation of patient/patient's resources: Denies Self-Neglect: Denies     Regulatory affairs officer (For Healthcare) Does patient have an advance directive?: No Would patient like information on creating an advanced directive?: No - patient declined information    Additional Information 1:1 In Past 12 Months?: No CIRT Risk: No Elopement Risk: No Does patient have medical clearance?: Yes     Disposition:  Disposition Initial Assessment Completed for this Encounter:  Yes Disposition  of Patient: Other dispositions (provided with resources) Type of inpatient treatment program: Adult  Cyndia Bent 01/20/2016 2:34 PM

## 2016-01-20 NOTE — ED Notes (Signed)
MD notified of plans for discharge and concern for htn medication.

## 2016-01-20 NOTE — ED Notes (Signed)
Family at bedside. 

## 2016-01-20 NOTE — Discharge Instructions (Signed)
For your ongoing behavioral health needs, you have been provided with printed information about the Mental Health Intensive Outpatient Program (MH-IOP) at the [facility] at [place].  If you decide that you would like to enroll in this program, call Dellia Nims, MEd at [phone] to schedule a start date, or for any questions that you may have about the program.  If you decide that you would prefer to see an outpatient therapist, contact one of the following treatment providers at your earliest opportunity to schedule an intake appointment:       [facility] at [address]      [place], [place] [address]      [phone]      [place]      [place], [place] [address]      [phone]       [facility] [place]      [address], [place] [address]      [place], [place] [address]      [phone]

## 2016-01-20 NOTE — ED Provider Notes (Signed)
CSN: [id number]     Arrival date & time 01/20/16  1303 History   First MD Initiated Contact with Patient 01/20/16 1327     Chief Complaint  Patient presents with  . Suicidal      HPI Increasing depression with increasing suicidal thoughts. No plan. Admits to marijuana and occasional wine. Denies hallucinations. No other complaints. Lost her job 1 week ago   Past Medical History  Diagnosis Date  . Hypertension   . Arthritis     osteoarthritis.  . Depression   . Anxiety    Past Surgical History  Procedure Laterality Date  . Tonsillectomy     Family History  Problem Relation Age of Onset  . Hypertension Mother   . Rheum arthritis Mother   . Diabetes Paternal Grandmother   . Diabetes Paternal Grandfather   . Other Father     suicide   Social History  Substance Use Topics  . Smoking status: Former Smoker -- 0.25 packs/day    Types: Cigarettes    Quit date: 04/14/2014  . Smokeless tobacco: None  . Alcohol Use: 12.6 oz/week    0 Standard drinks or equivalent, 21 Glasses of wine per week     Comment: socially wine   OB History    No data available     Review of Systems  All other systems reviewed and are negative.     Allergies  Review of patient's allergies indicates no known allergies.  Home Medications   Prior to Admission medications   Medication Sig Start Date End Date Taking? Authorizing Provider  acetaminophen (TYLENOL) 500 MG tablet Take 500 mg by mouth every 6 (six) hours as needed.    Historical Provider, MD  buPROPion (WELLBUTRIN XL) 300 MG 24 hr tablet Take 1 tablet (300 mg total) by mouth daily. 11/30/15   Arnoldo Morale, MD  citalopram (CELEXA) 40 MG tablet Take 1 tablet (40 mg total) by mouth daily. 11/30/15   Arnoldo Morale, MD  clonazePAM (KLONOPIN) 0.5 MG tablet Take by mouth. Reported on 06/26/2015    Historical Provider, MD  HYDROcodone-acetaminophen (VICODIN) 5-500 MG per tablet Take by mouth. Reported on 06/26/2015    Historical Provider, MD   Hydrocodone-Acetaminophen 5-300 MG TABS Take by mouth. Reported on 06/26/2015    Historical Provider, MD  ibuprofen (ADVIL,MOTRIN) 600 MG tablet Take 600 mg by mouth. 11/28/11   Historical Provider, MD  lisinopril-hydrochlorothiazide (PRINZIDE,ZESTORETIC) 20-25 MG tablet Take 1 tablet by mouth daily. 11/30/15   Arnoldo Morale, MD  meloxicam (MOBIC) 15 MG tablet Take one a day 09/08/15   Thurman Coyer, DO  Multiple Vitamin (MULTIVITAMIN WITH MINERALS) TABS tablet Take 1 tablet by mouth daily.    Historical Provider, MD  naproxen (NAPROSYN) 375 MG tablet Take 1 tablet (375 mg total) by mouth 2 (two) times daily. Patient not taking: Reported on 06/26/2015 04/28/14   Janne Napoleon, NP  naproxen (NAPROSYN) 500 MG tablet Take 1 tablet (500 mg total) by mouth 2 (two) times daily with a meal. Patient not taking: Reported on 06/26/2015 05/08/14   Lutricia Feil, PA  traMADol (ULTRAM) 50 MG tablet Take 1 tablet (50 mg total) by mouth every 6 (six) hours as needed. Patient not taking: Reported on 06/26/2015 09/10/14   Tresa Garter, MD  traZODone (DESYREL) 50 MG tablet Reported on 06/26/2015 04/20/15   Historical Provider, MD   There were no vitals taken for this visit. Physical Exam  Constitutional: She is oriented to person, place, and  time. She appears well-developed and well-nourished.  HENT:  Head: Normocephalic.  Eyes: EOM are normal.  Neck: Normal range of motion.  Pulmonary/Chest: Effort normal.  Abdominal: She exhibits no distension.  Musculoskeletal: Normal range of motion.  Neurological: She is alert and oriented to person, place, and time.  Psychiatric:  Tearful, suicidal thoughts  Nursing note and vitals reviewed.   ED Course  Procedures (including critical care time) Labs Review Labs Reviewed  COMPREHENSIVE METABOLIC PANEL  ETHANOL  SALICYLATE LEVEL  ACETAMINOPHEN LEVEL  CBC  URINE RAPID DRUG SCREEN, HOSP PERFORMED    Imaging Review No results found. I have  personally reviewed and evaluated these images and lab results as part of my medical decision-making.   EKG Interpretation None      MDM   Final diagnoses:  None    Medically clear. TTS to evaluate   Jola Schmidt, MD 01/20/16 1332

## 2016-01-20 NOTE — ED Provider Notes (Signed)
Patient seen and evaluated by the behavior health team who is recommending discharge from the emergency department at this time.  They do not believe the patient is a threat to herself or others.  Please see consultation note for complete details.  Jola Schmidt, MD 01/20/16 [phone]

## 2016-01-20 NOTE — BH Assessment (Signed)
[place] Assessment Progress Note  At the request of Lorenza Cambridge, TS this writer spoke to this pt about treatment options, including the Mental Health Intensive Outpatient Program (MH-IOP), and routine outpatient treatment.  Pt is interested in MH-IOP, but is uncertain whether she can make it work around her schedule.  She has, however, accepted printed information about the program, along with contact information for Dellia Nims, MEd.  Pt agrees to accept referrals to area therapists to schedule an intake appointment, as a backup plan.  At this time she is averse to being started on psychotropic medications.  Discharge instructions include referral information for the [facility], for [place], and for [facility].  Both Brandi and pt's nurse have been notified.  Jalene Mullet, [place] Triage Specialist [phone]

## 2016-01-20 NOTE — ED Notes (Signed)
Voluntary pt with [employer] with SI for one week. States "I will like to talk to a psychiatrist about my issues." Denies plan. Alert/Oriented at triage.

## 2016-01-20 NOTE — ED Notes (Signed)
Pt provided with discharge and follow up resources list. Pt verbalized understanding. Pt also provided bus pass but declined.

## 2016-01-20 NOTE — ED Notes (Signed)
MD at bedside. 

## 2016-01-27 MED FILL — traZODone HCL 50 MG TABS: 50 | 15 days supply | Qty: 15 | Fill #0

## 2016-01-27 MED FILL — ?CITALOPRAM HBR 40 MG TABLE: 40 | 30 days supply | Qty: 30 | Fill #0

## 2016-01-27 MED FILL — BUPROPION HCL XL 300 MG TAB: 300 | 30 days supply | Qty: 30 | Fill #0

## 2016-01-27 MED FILL — LISINOPRIL-HCTZ 20-25 MG TA: 20-25 | 30 days supply | Qty: 30 | Fill #0

## 2016-03-16 ENCOUNTER — Telehealth: Payer: Self-pay | Admitting: Internal Medicine

## 2016-03-16 MED ORDER — BUPROPION HCL ER (XL) 300 MG PO TB24: 300.0000 mg | ORAL_TABLET | Freq: Every day | ORAL | 0 refills | Status: DC

## 2016-03-16 MED ORDER — TRAZODONE HCL 50 MG PO TABS: 50.0000 mg | ORAL_TABLET | Freq: Every evening | ORAL | 0 refills | Status: DC | PRN

## 2016-03-16 MED ORDER — CITALOPRAM HYDROBROMIDE 40 MG PO TABS
40.0000 mg | ORAL_TABLET | Freq: Every day | ORAL | 0 refills | Status: DC
Start: 2016-03-16 — End: 2016-03-24

## 2016-03-16 MED ORDER — LISINOPRIL-HYDROCHLOROTHIAZIDE 20-25 MG PO TABS: 1.0000 | ORAL_TABLET | Freq: Every day | ORAL | 0 refills | Status: DC

## 2016-03-16 MED FILL — ?CITALOPRAM HBR 40 MG TABLE: 40 | 30 days supply | Qty: 30 | Fill #0

## 2016-03-16 MED FILL — LISINOPRIL-HCTZ 20-25 MG TA: 20-25 | 30 days supply | Qty: 30 | Fill #0

## 2016-03-16 MED FILL — BUPROPION HCL XL 300 MG TAB: 300 | 30 days supply | Qty: 30 | Fill #0

## 2016-03-16 NOTE — Telephone Encounter (Signed)
Pt called requesting medication refill on lisinopril-hydrochlorothiazide (PRINZIDE,ZESTORETIC) 20-25 MG tablet,traZODone (DESYREL) 50 MG tablet,citalopram (CELEXA) 40 MG tablet ,buPROPion (WELLBUTRIN XL) 300 MG 24 hr tablet  please f/up karl

## 2016-03-16 NOTE — Telephone Encounter (Signed)
Refilled requested medications - patient needs office visit for any further refills.

## 2016-03-24 ENCOUNTER — Encounter: Payer: Self-pay | Admitting: Internal Medicine

## 2016-03-24 ENCOUNTER — Ambulatory Visit: Payer: Medicare Other | Attending: Internal Medicine | Admitting: Internal Medicine

## 2016-03-24 VITALS — BP 150/98 | HR 85 | Temp 97.9°F | Resp 16 | Wt 154.2 lb

## 2016-03-24 DIAGNOSIS — F329 Major depressive disorder, single episode, unspecified: Secondary | ICD-10-CM | POA: Diagnosis not present

## 2016-03-24 DIAGNOSIS — Z1231 Encounter for screening mammogram for malignant neoplasm of breast: Secondary | ICD-10-CM | POA: Diagnosis not present

## 2016-03-24 DIAGNOSIS — F419 Anxiety disorder, unspecified: Secondary | ICD-10-CM | POA: Insufficient documentation

## 2016-03-24 DIAGNOSIS — N63 Unspecified lump in unspecified breast: Secondary | ICD-10-CM

## 2016-03-24 DIAGNOSIS — Z0000 Encounter for general adult medical examination without abnormal findings: Secondary | ICD-10-CM | POA: Insufficient documentation

## 2016-03-24 DIAGNOSIS — Z23 Encounter for immunization: Secondary | ICD-10-CM

## 2016-03-24 DIAGNOSIS — R928 Other abnormal and inconclusive findings on diagnostic imaging of breast: Secondary | ICD-10-CM | POA: Insufficient documentation

## 2016-03-24 DIAGNOSIS — K921 Melena: Secondary | ICD-10-CM | POA: Insufficient documentation

## 2016-03-24 DIAGNOSIS — Z79899 Other long term (current) drug therapy: Secondary | ICD-10-CM | POA: Insufficient documentation

## 2016-03-24 DIAGNOSIS — I10 Essential (primary) hypertension: Secondary | ICD-10-CM | POA: Diagnosis not present

## 2016-03-24 DIAGNOSIS — R739 Hyperglycemia, unspecified: Secondary | ICD-10-CM | POA: Insufficient documentation

## 2016-03-24 DIAGNOSIS — Z87891 Personal history of nicotine dependence: Secondary | ICD-10-CM | POA: Insufficient documentation

## 2016-03-24 DIAGNOSIS — M549 Dorsalgia, unspecified: Secondary | ICD-10-CM | POA: Insufficient documentation

## 2016-03-24 DIAGNOSIS — E2839 Other primary ovarian failure: Secondary | ICD-10-CM | POA: Diagnosis not present

## 2016-03-24 DIAGNOSIS — Z1211 Encounter for screening for malignant neoplasm of colon: Secondary | ICD-10-CM

## 2016-03-24 LAB — CBC WITH DIFFERENTIAL/PLATELET
BASOS PCT: 1 %
Basophils Absolute: 61 cells/uL (ref 0–200)
EOS PCT: 3 %
Eosinophils Absolute: 183 cells/uL (ref 15–500)
HCT: 37.8 % (ref 35.0–45.0)
HEMOGLOBIN: 12.4 g/dL (ref 11.7–15.5)
LYMPHS ABS: 2074 {cells}/uL (ref 850–3900)
Lymphocytes Relative: 34 %
MCH: 26.2 pg — AB (ref 27.0–33.0)
MCHC: 32.8 g/dL (ref 32.0–36.0)
MCV: 79.9 fL — ABNORMAL LOW (ref 80.0–100.0)
MONOS PCT: 9 %
MPV: 10 fL (ref 7.5–12.5)
Monocytes Absolute: 549 cells/uL (ref 200–950)
NEUTROS ABS: 3233 {cells}/uL (ref 1500–7800)
Neutrophils Relative %: 53 %
PLATELETS: 331 10*3/uL (ref 140–400)
RBC: 4.73 MIL/uL (ref 3.80–5.10)
RDW: 16.5 % — ABNORMAL HIGH (ref 11.0–15.0)
WBC: 6.1 10*3/uL (ref 3.8–10.8)

## 2016-03-24 LAB — CMP AND LIVER
ALBUMIN: 4.5 g/dL (ref 3.6–5.1)
ALK PHOS: 82 U/L (ref 33–130)
ALT: 20 U/L (ref 6–29)
AST: 22 U/L (ref 10–35)
BILIRUBIN DIRECT: 0.1 mg/dL (ref <=0.2)
BILIRUBIN INDIRECT: 0.2 mg/dL (ref 0.2–1.2)
BILIRUBIN TOTAL: 0.3 mg/dL (ref 0.2–1.2)
BUN: 16 mg/dL (ref 7–25)
CO2: 28 mmol/L (ref 20–31)
CREATININE: 0.92 mg/dL (ref 0.50–0.99)
Calcium: 9.4 mg/dL (ref 8.6–10.4)
Chloride: 108 mmol/L (ref 98–110)
GLUCOSE: 85 mg/dL (ref 65–99)
Potassium: 4.7 mmol/L (ref 3.5–5.3)
Sodium: 144 mmol/L (ref 135–146)
TOTAL PROTEIN: 7.7 g/dL (ref 6.1–8.1)

## 2016-03-24 LAB — POCT GLYCOSYLATED HEMOGLOBIN (HGB A1C): HEMOGLOBIN A1C: 5.4

## 2016-03-24 LAB — TSH: TSH: 0.29 mIU/L — ABNORMAL LOW

## 2016-03-24 MED ORDER — CITALOPRAM HYDROBROMIDE 40 MG PO TABS: 40.0000 mg | ORAL_TABLET | Freq: Every day | ORAL | 2 refills | Status: DC

## 2016-03-24 MED ORDER — LISINOPRIL-HYDROCHLOROTHIAZIDE 20-25 MG PO TABS: 1.0000 | ORAL_TABLET | Freq: Every day | ORAL | 2 refills | Status: DC

## 2016-03-24 MED ORDER — BUPROPION HCL ER (XL) 300 MG PO TB24: 300.0000 mg | ORAL_TABLET | Freq: Every day | ORAL | 1 refills | Status: DC

## 2016-03-24 NOTE — Progress Notes (Signed)
Pt is in the office today for establish care, hypertension and back pain Pt states her pain level in the office today is between 6-7

## 2016-03-24 NOTE — Patient Instructions (Addendum)
Influenza Virus Vaccine injection (Fluarix) What is this medicine? INFLUENZA VIRUS VACCINE (in floo EN zuh VAHY ruhs vak SEEN) helps to reduce the risk of getting influenza also known as the flu. This medicine may be used for other purposes; ask your health care provider or pharmacist if you have questions. What should I tell my health care provider before I take this medicine? They need to know if you have any of these conditions: -bleeding disorder like hemophilia -fever or infection -Guillain-Barre syndrome or other neurological problems -immune system problems -infection with the human immunodeficiency virus (HIV) or AIDS -low blood platelet counts -multiple sclerosis -an unusual or allergic reaction to influenza virus vaccine, eggs, chicken proteins, latex, gentamicin, other medicines, foods, dyes or preservatives -pregnant or trying to get pregnant -breast-feeding How should I use this medicine? This vaccine is for injection into a muscle. It is given by a health care professional. A copy of Vaccine Information Statements will be given before each vaccination. Read this sheet carefully each time. The sheet may change frequently. Talk to your pediatrician regarding the use of this medicine in children. Special care may be needed. Overdosage: If you think you have taken too much of this medicine contact a poison control center or emergency room at once. NOTE: This medicine is only for you. Do not share this medicine with others. What if I miss a dose? This does not apply. What may interact with this medicine? -chemotherapy or radiation therapy -medicines that lower your immune system like etanercept, anakinra, infliximab, and adalimumab -medicines that treat or prevent blood clots like warfarin -phenytoin -steroid medicines like prednisone or cortisone -theophylline -vaccines This list may not describe all possible interactions. Give your health care provider a list of all the  medicines, herbs, non-prescription drugs, or dietary supplements you use. Also tell them if you smoke, drink alcohol, or use illegal drugs. Some items may interact with your medicine. What should I watch for while using this medicine? Report any side effects that do not go away within 3 days to your doctor or health care professional. Call your health care provider if any unusual symptoms occur within 6 weeks of receiving this vaccine. You may still catch the flu, but the illness is not usually as bad. You cannot get the flu from the vaccine. The vaccine will not protect against colds or other illnesses that may cause fever. The vaccine is needed every year. What side effects may I notice from receiving this medicine? Side effects that you should report to your doctor or health care professional as soon as possible: -allergic reactions like skin rash, itching or hives, swelling of the face, lips, or tongue Side effects that usually do not require medical attention (report to your doctor or health care professional if they continue or are bothersome): -fever -headache -muscle aches and pains -pain, tenderness, redness, or swelling at site where injected -weak or tired This list may not describe all possible side effects. Call your doctor for medical advice about side effects. You may report side effects to FDA at 1-800-FDA-1088. Where should I keep my medicine? This vaccine is only given in a clinic, pharmacy, doctor's office, or other health care setting and will not be stored at home. NOTE: This sheet is a summary. It may not cover all possible information. If you have questions about this medicine, talk to your doctor, pharmacist, or health care provider.    2016, Elsevier/Gold Standard. (2008-01-23 09:30:40)  - DASH Eating Plan DASH stands for "  Dietary Approaches to Stop Hypertension." The DASH eating plan is a healthy eating plan that has been shown to reduce high blood pressure  (hypertension). Additional health benefits may include reducing the risk of type 2 diabetes mellitus, heart disease, and stroke. The DASH eating plan may also help with weight loss. WHAT DO I NEED TO KNOW ABOUT THE DASH EATING PLAN? For the DASH eating plan, you will follow these general guidelines:  Choose foods with a percent daily value for sodium of less than 5% (as listed on the food label).  Use salt-free seasonings or herbs instead of table salt or sea salt.  Check with your health care provider or pharmacist before using salt substitutes.  Eat lower-sodium products, often labeled as "lower sodium" or "no salt added."  Eat fresh foods.  Eat more vegetables, fruits, and low-fat dairy products.  Choose whole grains. Look for the word "whole" as the first word in the ingredient list.  Choose fish and skinless chicken or [place] more often than red meat. Limit fish, poultry, and meat to 6 oz (170 g) each day.  Limit sweets, desserts, sugars, and sugary drinks.  Choose heart-healthy fats.  Limit cheese to 1 oz (28 g) per day.  Eat more home-cooked food and less restaurant, buffet, and fast food.  Limit fried foods.  Cook foods using methods other than frying.  Limit canned vegetables. If you do use them, rinse them well to decrease the sodium.  When eating at a restaurant, ask that your food be prepared with less salt, or no salt if possible. WHAT FOODS CAN I EAT? Seek help from a dietitian for individual calorie needs. Grains Whole grain or whole wheat bread. Brown rice. Whole grain or whole wheat pasta. Quinoa, bulgur, and whole grain cereals. Low-sodium cereals. Corn or whole wheat flour tortillas. Whole grain cornbread. Whole grain crackers. Low-sodium crackers. Vegetables Fresh or frozen vegetables (raw, steamed, roasted, or grilled). Low-sodium or reduced-sodium tomato and vegetable juices. Low-sodium or reduced-sodium tomato sauce and paste. Low-sodium or  reduced-sodium canned vegetables.  Fruits All fresh, canned (in natural juice), or frozen fruits. Meat and Other Protein Products Ground beef (85% or leaner), grass-fed beef, or beef trimmed of fat. Skinless chicken or [place]. Ground chicken or [place]. Pork trimmed of fat. All fish and seafood. Eggs. Dried beans, peas, or lentils. Unsalted nuts and seeds. Unsalted canned beans. Dairy Low-fat dairy products, such as skim or 1% milk, 2% or reduced-fat cheeses, low-fat ricotta or cottage cheese, or plain low-fat yogurt. Low-sodium or reduced-sodium cheeses. Fats and Oils Tub margarines without trans fats. Light or reduced-fat mayonnaise and salad dressings (reduced sodium). Avocado. Safflower, olive, or canola oils. Natural peanut or almond butter. Other Unsalted popcorn and pretzels. The items listed above may not be a complete list of recommended foods or beverages. Contact your dietitian for more options. WHAT FOODS ARE NOT RECOMMENDED? Grains White bread. White pasta. White rice. Refined cornbread. Bagels and croissants. Crackers that contain trans fat. Vegetables Creamed or fried vegetables. Vegetables in a cheese sauce. Regular canned vegetables. Regular canned tomato sauce and paste. Regular tomato and vegetable juices. Fruits Dried fruits. Canned fruit in light or heavy syrup. Fruit juice. Meat and Other Protein Products Fatty cuts of meat. Ribs, chicken wings, bacon, sausage, bologna, salami, chitterlings, fatback, hot dogs, bratwurst, and packaged luncheon meats. Salted nuts and seeds. Canned beans with salt. Dairy Whole or 2% milk, cream, half-and-half, and cream cheese. Whole-fat or sweetened yogurt. Full-fat cheeses or blue cheese. Nondairy  creamers and whipped toppings. Processed cheese, cheese spreads, or cheese curds. Condiments Onion and garlic salt, seasoned salt, table salt, and sea salt. Canned and packaged gravies. Worcestershire sauce. Tartar sauce. Barbecue sauce. Teriyaki  sauce. Soy sauce, including reduced sodium. Steak sauce. Fish sauce. Oyster sauce. Cocktail sauce. Horseradish. Ketchup and mustard. Meat flavorings and tenderizers. Bouillon cubes. Hot sauce. Tabasco sauce. Marinades. Taco seasonings. Relishes. Fats and Oils Butter, stick margarine, lard, shortening, ghee, and bacon fat. Coconut, palm kernel, or palm oils. Regular salad dressings. Other Pickles and olives. Salted popcorn and pretzels. The items listed above may not be a complete list of foods and beverages to avoid. Contact your dietitian for more information. WHERE CAN I FIND MORE INFORMATION? National Heart, Lung, and Blood Institute: travelstabloid.com   This information is not intended to replace advice given to you by your health care provider. Make sure you discuss any questions you have with your health care provider.   Document Released: 06/16/2011 Document Revised: 07/18/2014 Document Reviewed: 05/01/2013 Elsevier Interactive Patient Education 2016 Elsevier Inc.    -  Hypertension Hypertension is another name for high blood pressure. High blood pressure forces your heart to work harder to pump blood. A blood pressure reading has two numbers, which includes a higher number over a lower number (example: 110/72). HOME CARE   Have your blood pressure rechecked by your doctor.  Only take medicine as told by your doctor. Follow the directions carefully. The medicine does not work as well if you skip doses. Skipping doses also puts you at risk for problems.  Do not smoke.  Monitor your blood pressure at home as told by your doctor. GET HELP IF:  You think you are having a reaction to the medicine you are taking.  You have repeat headaches or feel dizzy.  You have puffiness (swelling) in your ankles.  You have trouble with your vision. GET HELP RIGHT AWAY IF:   You get a very bad headache and are confused.  You feel weak, numb, or  faint.  You get chest or belly (abdominal) pain.  You throw up (vomit).  You cannot breathe very well. MAKE SURE YOU:   Understand these instructions.  Will watch your condition.  Will get help right away if you are not doing well or get worse.   This information is not intended to replace advice given to you by your health care provider. Make sure you discuss any questions you have with your health care provider.   Document Released: 12/14/2007 Document Revised: 07/02/2013 Document Reviewed: 04/19/2013 Elsevier Interactive Patient Education 2016 Elsevier Inc.  - High-Fiber Diet Fiber, also called dietary fiber, is a type of carbohydrate found in fruits, vegetables, whole grains, and beans. A high-fiber diet can have many health benefits. Your health care provider may recommend a high-fiber diet to help:  Prevent constipation. Fiber can make your bowel movements more regular.  Lower your cholesterol.  Relieve hemorrhoids, uncomplicated diverticulosis, or irritable bowel syndrome.  Prevent overeating as part of a weight-loss plan.  Prevent heart disease, type 2 diabetes, and certain cancers. WHAT IS MY PLAN? The recommended daily intake of fiber includes:  38 grams for men under age 55.  73 grams for men over age 20.  53 grams for women under age 16.  83 grams for women over age 71. You can get the recommended daily intake of dietary fiber by eating a variety of fruits, vegetables, grains, and beans. Your health care provider may also recommend a  fiber supplement if it is not possible to get enough fiber through your diet. WHAT DO I NEED TO KNOW ABOUT A HIGH-FIBER DIET?  Fiber supplements have not been widely studied for their effectiveness, so it is better to get fiber through food sources.  Always check the fiber content on thenutrition facts label of any prepackaged food. Look for foods that contain at least 5 grams of fiber per serving.  Ask your dietitian if  you have questions about specific foods that are related to your condition, especially if those foods are not listed in the following section.  Increase your daily fiber consumption gradually. Increasing your intake of dietary fiber too quickly may cause bloating, cramping, or gas.  Drink plenty of water. Water helps you to digest fiber. WHAT FOODS CAN I EAT? Grains Whole-grain breads. Multigrain cereal. Oats and oatmeal. Brown rice. Barley. Bulgur wheat. [place]. Bran muffins. Popcorn. Rye wafer crackers. Vegetables Sweet potatoes. Spinach. Kale. Artichokes. Cabbage. Broccoli. Green peas. Carrots. Squash. Fruits Berries. Pears. Apples. Oranges. Avocados. Prunes and raisins. Dried figs. Meats and Other Protein Sources [place], kidney, pinto, and soy beans. Split peas. Lentils. Nuts and seeds. Dairy Fiber-fortified yogurt. Beverages Fiber-fortified soy milk. Fiber-fortified orange juice. Other Fiber bars. The items listed above may not be a complete list of recommended foods or beverages. Contact your dietitian for more options. WHAT FOODS ARE NOT RECOMMENDED? Grains White bread. Pasta made with refined flour. White rice. Vegetables Fried potatoes. Canned vegetables. Well-cooked vegetables.  Fruits Fruit juice. Cooked, strained fruit. Meats and Other Protein Sources Fatty cuts of meat. Fried Sales executive or fried fish. Dairy Milk. Yogurt. Cream cheese. Sour cream. Beverages Soft drinks. Other Cakes and pastries. Butter and oils. The items listed above may not be a complete list of foods and beverages to avoid. Contact your dietitian for more information. WHAT ARE SOME TIPS FOR INCLUDING HIGH-FIBER FOODS IN MY DIET?  Eat a wide variety of high-fiber foods.  Make sure that half of all grains consumed each day are whole grains.  Replace breads and cereals made from refined flour or white flour with whole-grain breads and cereals.  Replace white rice with brown rice, bulgur wheat, or  millet.  Start the day with a breakfast that is high in fiber, such as a cereal that contains at least 5 grams of fiber per serving.  Use beans in place of meat in soups, salads, or pasta.  Eat high-fiber snacks, such as berries, raw vegetables, nuts, or popcorn.   This information is not intended to replace advice given to you by your health care provider. Make sure you discuss any questions you have with your health care provider.   Document Released: 06/27/2005 Document Revised: 07/18/2014 Document Reviewed: 12/10/2013 Elsevier Interactive Patient Education 2016 [place] is a normal process in which your reproductive ability comes to an end. This process happens gradually over a span of months to years, usually between the ages of 31 and 40. Menopause is complete when you have missed 12 consecutive menstrual periods. It is important to talk with your health care provider about some of the most common conditions that affect postmenopausal women, such as heart disease, cancer, and bone loss (osteoporosis). Adopting a healthy lifestyle and getting preventive care can help to promote your health and wellness. Those actions can also lower your chances of developing some of these common conditions. WHAT SHOULD I KNOW ABOUT MENOPAUSE? During menopause, you may experience a number of symptoms, such as:  Moderate-to-severe hot flashes.  Night sweats.  Decrease in sex drive.  Mood swings.  Headaches.  Tiredness.  Irritability.  Memory problems.  Insomnia. Choosing to treat or not to treat menopausal changes is an individual decision that you make with your health care provider. WHAT SHOULD I KNOW ABOUT HORMONE REPLACEMENT THERAPY AND SUPPLEMENTS? Hormone therapy products are effective for treating symptoms that are associated with menopause, such as hot flashes and night sweats. Hormone replacement carries certain risks, especially as you become older. If you are  thinking about using estrogen or estrogen with progestin treatments, discuss the benefits and risks with your health care provider. WHAT SHOULD I KNOW ABOUT HEART DISEASE AND STROKE? Heart disease, heart attack, and stroke become more likely as you age. This may be due, in part, to the hormonal changes that your body experiences during menopause. These can affect how your body processes dietary fats, triglycerides, and cholesterol. Heart attack and stroke are both medical emergencies. There are many things that you can do to help prevent heart disease and stroke:  Have your blood pressure checked at least every 1-2 years. High blood pressure causes heart disease and increases the risk of stroke.  If you are 2-5 years old, ask your health care provider if you should take aspirin to prevent a heart attack or a stroke.  Do not use any tobacco products, including cigarettes, chewing tobacco, or electronic cigarettes. If you need help quitting, ask your health care provider.  It is important to eat a healthy diet and maintain a healthy weight.  Be sure to include plenty of vegetables, fruits, low-fat dairy products, and lean protein.  Avoid eating foods that are high in solid fats, added sugars, or salt (sodium).  Get regular exercise. This is one of the most important things that you can do for your health.  Try to exercise for at least 150 minutes each week. The type of exercise that you do should increase your heart rate and make you sweat. This is known as moderate-intensity exercise.  Try to do strengthening exercises at least twice each week. Do these in addition to the moderate-intensity exercise.  Know your numbers.Ask your health care provider to check your cholesterol and your blood glucose. Continue to have your blood tested as directed by your health care provider. WHAT SHOULD I KNOW ABOUT CANCER SCREENING? There are several types of cancer. Take the following steps to reduce your  risk and to catch any cancer development as early as possible. Breast Cancer  Practice breast self-awareness.  This means understanding how your breasts normally appear and feel.  It also means doing regular breast self-exams. Let your health care provider know about any changes, no matter how small.  If you are 28 or older, have a clinician do a breast exam (clinical breast exam or CBE) every year. Depending on your age, family history, and medical history, it may be recommended that you also have a yearly breast X-ray (mammogram).  If you have a family history of breast cancer, talk with your health care provider about genetic screening.  If you are at high risk for breast cancer, talk with your health care provider about having an MRI and a mammogram every year.  Breast cancer (BRCA) gene test is recommended for women who have family members with BRCA-related cancers. Results of the assessment will determine the need for genetic counseling and BRCA1 and for BRCA2 testing. BRCA-related cancers include these types:  Breast. This occurs in males or  females.  Ovarian.  Tubal. This may also be called fallopian tube cancer.  Cancer of the abdominal or pelvic lining (peritoneal cancer).  Prostate.  Pancreatic. Cervical, Uterine, and Ovarian Cancer Your health care provider may recommend that you be screened regularly for cancer of the pelvic organs. These include your ovaries, uterus, and vagina. This screening involves a pelvic exam, which includes checking for microscopic changes to the surface of your cervix (Pap test).  For women ages 21-65, health care providers may recommend a pelvic exam and a Pap test every three years. For women ages 21-65, they may recommend the Pap test and pelvic exam, combined with testing for human papilloma virus (HPV), every five years. Some types of HPV increase your risk of cervical cancer. Testing for HPV may also be done on women of any age who have  unclear Pap test results.  Other health care providers may not recommend any screening for nonpregnant women who are considered low risk for pelvic cancer and have no symptoms. Ask your health care provider if a screening pelvic exam is right for you.  If you have had past treatment for cervical cancer or a condition that could lead to cancer, you need Pap tests and screening for cancer for at least 20 years after your treatment. If Pap tests have been discontinued for you, your risk factors (such as having a new sexual partner) need to be reassessed to determine if you should start having screenings again. Some women have medical problems that increase the chance of getting cervical cancer. In these cases, your health care provider may recommend that you have screening and Pap tests more often.  If you have a family history of uterine cancer or ovarian cancer, talk with your health care provider about genetic screening.  If you have vaginal bleeding after reaching menopause, tell your health care provider.  There are currently no reliable tests available to screen for ovarian cancer. Lung Cancer Lung cancer screening is recommended for adults 15-70 years old who are at high risk for lung cancer because of a history of smoking. A yearly low-dose CT scan of the lungs is recommended if you:  Currently smoke.  Have a history of at least 30 pack-years of smoking and you currently smoke or have quit within the past 15 years. A pack-year is smoking an average of one pack of cigarettes per day for one year. Yearly screening should:  Continue until it has been 15 years since you quit.  Stop if you develop a health problem that would prevent you from having lung cancer treatment. Colorectal Cancer  This type of cancer can be detected and can often be prevented.  Routine colorectal cancer screening usually begins at age 35 and continues through age 37.  If you have risk factors for colon cancer,  your health care provider may recommend that you be screened at an earlier age.  If you have a family history of colorectal cancer, talk with your health care provider about genetic screening.  Your health care provider may also recommend using home test kits to check for hidden blood in your stool.  A small camera at the end of a tube can be used to examine your colon directly (sigmoidoscopy or colonoscopy). This is done to check for the earliest forms of colorectal cancer.  Direct examination of the colon should be repeated every 5-10 years until age 38. However, if early forms of precancerous polyps or small growths are found or if you  have a family history or genetic risk for colorectal cancer, you may need to be screened more often. Skin Cancer  Check your skin from head to toe regularly.  Monitor any moles. Be sure to tell your health care provider:  About any new moles or changes in moles, especially if there is a change in a mole's shape or color.  If you have a mole that is larger than the size of a pencil eraser.  If any of your family members has a history of skin cancer, especially at a young age, talk with your health care provider about genetic screening.  Always use sunscreen. Apply sunscreen liberally and repeatedly throughout the day.  Whenever you are outside, protect yourself by wearing long sleeves, pants, a wide-brimmed hat, and sunglasses. WHAT SHOULD I KNOW ABOUT OSTEOPOROSIS? Osteoporosis is a condition in which bone destruction happens more quickly than new bone creation. After menopause, you may be at an increased risk for osteoporosis. To help prevent osteoporosis or the bone fractures that can happen because of osteoporosis, the following is recommended:  If you are 41-88 years old, get at least 1,000 mg of calcium and at least 600 mg of vitamin D per day.  If you are older than age 54 but younger than age 23, get at least 1,200 mg of calcium and at least 600  mg of vitamin D per day.  If you are older than age 54, get at least 1,200 mg of calcium and at least 800 mg of vitamin D per day. Smoking and excessive alcohol intake increase the risk of osteoporosis. Eat foods that are rich in calcium and vitamin D, and do weight-bearing exercises several times each week as directed by your health care provider. WHAT SHOULD I KNOW ABOUT HOW MENOPAUSE AFFECTS [place]? Depression may occur at any age, but it is more common as you become older. Common symptoms of depression include:  Low or sad mood.  Changes in sleep patterns.  Changes in appetite or eating patterns.  Feeling an overall lack of motivation or enjoyment of activities that you previously enjoyed.  Frequent crying spells. Talk with your health care provider if you think that you are experiencing depression. WHAT SHOULD I KNOW ABOUT IMMUNIZATIONS? It is important that you get and maintain your immunizations. These include:  Tetanus, diphtheria, and pertussis (Tdap) booster vaccine.  Influenza every year before the flu season begins.  Pneumonia vaccine.  Shingles vaccine. Your health care provider may also recommend other immunizations.   This information is not intended to replace advice given to you by your health care provider. Make sure you discuss any questions you have with your health care provider.   Document Released: 08/19/2005 Document Revised: 07/18/2014 Document Reviewed: 02/27/2014 Elsevier Interactive Patient Education 2016 Elsevier Inc. Pneumococcal Polysaccharide Vaccine: What You Need to Know 1. Why get vaccinated? Vaccination can protect older adults (and some children and younger adults) from pneumococcal disease. Pneumococcal disease is caused by bacteria that can spread from person to person through close contact. It can cause ear infections, and it can also lead to more serious infections of the:   Lungs (pneumonia),  Blood (bacteremia),  and  Covering of the brain and spinal cord (meningitis). Meningitis can cause deafness and brain damage, and it can be fatal. Anyone can get pneumococcal disease, but children under 44 years of age, people with certain medical conditions, adults over 13 years of age, and cigarette smokers are at the highest risk. About 18,000  older adults die each year from pneumococcal disease in the [place]. Treatment of pneumococcal infections with penicillin and other drugs used to be more effective. But some strains of the disease have become resistant to these drugs. This makes prevention of the disease, through vaccination, even more important. 2. Pneumococcal polysaccharide vaccine (PPSV23) Pneumococcal polysaccharide vaccine (PPSV23) protects against 23 types of pneumococcal bacteria. It will not prevent all pneumococcal disease. PPSV23 is recommended for:  All adults 44 years of age and older,  Anyone 2 through 67 years of age with certain long-term health problems,  Anyone 2 through 66 years of age with a weakened immune system,  Adults 82 through 67 years of age who smoke cigarettes or have asthma. Most people need only one dose of PPSV. A second dose is recommended for certain high-risk groups. People 31 and older should get a dose even if they have gotten one or more doses of the vaccine before they turned 65. Your healthcare provider can give you more information about these recommendations. Most healthy adults develop protection within 2 to 3 weeks of getting the shot. 3. Some people should not get this vaccine  Anyone who has had a life-threatening allergic reaction to PPSV should not get another dose.  Anyone who has a severe allergy to any component of PPSV should not receive it. Tell your provider if you have any severe allergies.  Anyone who is moderately or severely ill when the shot is scheduled may be asked to wait until they recover before getting the vaccine. Someone with a  mild illness can usually be vaccinated.  Children less than 65 years of age should not receive this vaccine.  There is no evidence that PPSV is harmful to either a pregnant woman or to her fetus. However, as a precaution, women who need the vaccine should be vaccinated before becoming pregnant, if possible. 4. Risks of a vaccine reaction With any medicine, including vaccines, there is a chance of side effects. These are usually mild and go away on their own, but serious reactions are also possible. About half of people who get PPSV have mild side effects, such as redness or pain where the shot is given, which go away within about two days. Less than 1 out of 100 people develop a fever, muscle aches, or more severe local reactions. Problems that could happen after any vaccine:  People sometimes faint after a medical procedure, including vaccination. Sitting or lying down for about 15 minutes can help prevent fainting, and injuries caused by a fall. Tell your doctor if you feel dizzy, or have vision changes or ringing in the ears.  Some people get severe pain in the shoulder and have difficulty moving the arm where a shot was given. This happens very rarely.  Any medication can cause a severe allergic reaction. Such reactions from a vaccine are very rare, estimated at about 1 in a million doses, and would happen within a few minutes to a few hours after the vaccination. As with any medicine, there is a very remote chance of a vaccine causing a serious injury or death. The safety of vaccines is always being monitored. For more information, visit: http://www.aguilar.org/ 5. What if there is a serious reaction? What should I look for? Look for anything that concerns you, such as signs of a severe allergic reaction, very high fever, or unusual behavior.  Signs of a severe allergic reaction can include hives, swelling of the face and throat, difficulty breathing, a  fast heartbeat, dizziness, and  weakness. These would usually start a few minutes to a few hours after the vaccination. What should I do? If you think it is a severe allergic reaction or other emergency that can't wait, call 9-1-1 or get to the nearest hospital. Otherwise, call your doctor. Afterward, the reaction should be reported to the Vaccine Adverse Event Reporting System (VAERS). Your doctor might file this report, or you can do it yourself through the VAERS web site at www.vaers.SamedayNews.es, or by calling [phone].  VAERS does not give medical advice. 6. How can I learn more?  Ask your doctor. He or she can give you the vaccine package insert or suggest other sources of information.  Call your local or state health department.  Contact the Centers for Disease Control and Prevention (CDC):  Call (260)648-1122 (1-800-CDC-INFO) or  Visit CDC's website at http://hunter.com/ CDC Pneumococcal Polysaccharide Vaccine VIS (11/01/13)   This information is not intended to replace advice given to you by your health care provider. Make sure you discuss any questions you have with your health care provider.   Document Released: 04/24/2006 Document Revised: 07/18/2014 Document Reviewed: 11/04/2013 Elsevier Interactive Patient Education [employer].

## 2016-03-24 NOTE — Progress Notes (Signed)
Kristen Chavez, is a 65 y.o. female  HYQ:[id number]  [id number]:[id number]  DOB - 1951-03-20  CC:  Chief Complaint  Patient presents with  . Establish Care  . Hypertension  . Back Pain       HPI: Kristen Chavez is a 65 y.o. female here today to establish medical care, last seen in clinic 12/16, w/ hx of htn, MDD, and chronic back pain. Hx of heavy tob use, but she states she has stopped completely x 19month now.  Says she starting to exercise more and trying to watch what she eats.  Per pt, she ran out of her antidepressants (welbutrin and celexa) about 4 days now and started to "go crazy" with thoughts of death, etc. She denies si/hi/avh, but wants to get back on her meds asap.  She ran out of her bp meds for the last 2-3 months as well.  Co of brbpr, painless last few weeks. Some abd discomfort in llq at times, strains w/ stool, stool described as dard/hard at times, denies melena/hematemesis. No prior hx of diverticular disease as far as she knows, never had colonoscopy but is now interested in it.  Patient has No headache, No chest pain, No abdominal pain currently - No Nausea, No new weakness tingling or numbness, No Cough - SOB.  No unintentional weight loss.    Review of Systems: Per hpi, o/w all systems reviewed and negative.   No Known Allergies Past Medical History:  Diagnosis Date  . Anxiety   . Arthritis    osteoarthritis.  . Depression   . Hypertension    Current Outpatient Prescriptions on File Prior to Visit  Medication Sig Dispense Refill  . acetaminophen (TYLENOL) 500 MG tablet Take 500 mg by mouth every 6 (six) hours as needed for moderate pain or headache.     . Flavoring Agent (GRAPEFRUIT FLAVOR) OIL Apply 1 application topically daily as needed (knee pain). White grapefruit oil    . Lavender Oil OIL Apply 1 application topically 2 (two) times daily as needed (knee pain).    . Multiple Vitamin (MULTIVITAMIN WITH MINERALS) TABS tablet Take 1 tablet by mouth  daily.    . traZODone (DESYREL) 50 MG tablet Take 1 tablet (50 mg total) by mouth at bedtime as needed for sleep. Reported on 06/26/2015 15 tablet 0  . meloxicam (MOBIC) 15 MG tablet Take one a day (Patient not taking: Reported on 03/24/2016) 20 tablet 0   No current facility-administered medications on file prior to visit.    Family History  Problem Relation Age of Onset  . Hypertension Mother   . Rheum arthritis Mother   . Diabetes Paternal Grandmother   . Diabetes Paternal Grandfather   . Other Father     suicide   Social History   Social History  . Marital status: Single    Spouse name: N/A  . Number of children: N/A  . Years of education: N/A   Occupational History  . Not on file.   Social History Main Topics  . Smoking status: Former Smoker    Packs/day: 0.25    Types: Cigarettes    Quit date: 04/14/2014  . Smokeless tobacco: Not on file  . Alcohol use 12.6 oz/week    21 Glasses of wine per week     Comment: socially wine  . Drug use:     Types: Marijuana  . Sexual activity: No   Other Topics Concern  . Not on file   Social History Narrative  .  No narrative on file    Objective:   Vitals:   03/24/16 1045  BP: (!) 150/98  Pulse: 85  Resp: 16  Temp: 97.9 F (36.6 C)    Filed Weights   03/24/16 1045  Weight: 154 lb 3.2 oz (69.9 kg)    BP Readings from Last 3 Encounters:  03/24/16 (!) 150/98  01/20/16 143/96  06/26/15 132/88    Physical Exam: Constitutional: Patient appears well-developed and well-nourished. No distress. AAOx3, pleasant, obese HENT: Normocephalic, atraumatic, External right and left ear normal. Oropharynx is clear and moist.  bilat tms clear, some irritation on left ear cannal. (pt uses qtips sometimes) Eyes: Conjunctivae and EOM are normal. PERRL, no scleral icterus. Neck: Normal ROM. Neck supple. No JVD.  CVS: RRR, S1/S2 +, no murmurs, no gallops, no carotid bruit.  Pulmonary: Effort and breath sounds normal, no stridor,  rhonchi, wheezes, rales.  Abdominal: Soft. BS +, obese, no distension, tenderness, rebound or guarding.  No hsm, neg Murphy's sign Musculoskeletal: Normal range of motion. No edema and no tenderness.  LE: bilat/ no c/c/e, pulses 2+ bilateral. Neuro: Alert.  muscle tone coordination wnl. No cranial nerve deficit grossly. Skin: Skin is warm and dry. No rash noted. Not diaphoretic. No erythema. No pallor. Psychiatric: Normal mood and affect. Behavior, judgment, thought content normal.  Lab Results  Component Value Date   WBC 6.5 01/20/2016   HGB 11.7 (L) 01/20/2016   HCT 35.7 (L) 01/20/2016   MCV 80.0 01/20/2016   PLT 231 01/20/2016   Lab Results  Component Value Date   CREATININE 0.88 01/20/2016   BUN 14 01/20/2016   NA 138 01/20/2016   K 3.7 01/20/2016   CL 106 01/20/2016   CO2 24 01/20/2016    Lab Results  Component Value Date   HGBA1C 5.4 03/24/2016   Lipid Panel     Component Value Date/Time   CHOL 163 08/26/2013 1235   TRIG 84 08/26/2013 1235   HDL 76 08/26/2013 1235   CHOLHDL 2.1 08/26/2013 1235   VLDL 17 08/26/2013 1235   LDLCALC 70 08/26/2013 1235       Depression screen PHQ 2/9 03/24/2016 06/26/2015 06/08/2015 06/08/2015 06/08/2015  Decreased Interest 1 1 0 0 0  Down, Depressed, Hopeless 2 1 0 0 0  PHQ - 2 Score 3 2 0 0 0  Altered sleeping 1 2 - - -  Tired, decreased energy 1 0 - - -  Change in appetite 0 0 - - -  Feeling bad or failure about yourself  1 1 - - -  Trouble concentrating 2 1 - - -  Moving slowly or fidgety/restless 0 0 - - -  Suicidal thoughts 1 0 - - -  PHQ-9 Score 9 6 - - -    06/2008 mm DG SCREEN MAMMOGRAM BILATERAL Bilateral CC and MLO view(s) were taken.   DIGITAL SCREENING MAMMOGRAM WITH CAD: There are scattered fibroglandular densities.  Possible masses are noted in both breasts.  Spot  compression views and possibly sonography are recommended for further evaluation.  Compared with  prior studies.   IMPRESSION: Possible  masses, bilateral breasts.  Additional evaluation is indicated.  The patient will be  contacted for additional studies and a supplementary report will follow.   ASSESSMENT: Need additional imaging evaluation and/or prior mammograms for comparison - BI-RADS 0   Further imaging of both breasts. ANALYZED BY COMPUTER AIDED DETECTION. , THIS PROCEDURE WAS A DIGITAL MAMMOGRAM.  Provider: Phebe Colla Assessment and plan:  1. HTN (hypertension), benign Ran out of meds last 2 months or so - CBC with Differential/Platelet - CMP and Liver - renewed prinzide 20-25 qd  2. Hematochezia - suspect diverticular disease, but need to get colonoscopy. - chk cbc - amb ref gi - high fiber diet recd.  3. Colon cancer screening, with associated hematochezia - Ambulatory referral to Gastroenterology  4.  Estrogen deficiency - DG Bone Density; Future - TSH   5. Possible masses noted on screening MM 2009, never f/u - MM DIAG BREAST TOMO BILATERAL; Future - US BREAST LTD UNI RIGHT INC AXILLA; Future - US BREAST LTD UNI LEFT INC AXILLA; Future  6. Hyperglycemia, dm screening, annual maintenance exam - HgB A1c 5.4  7. Hx of tob abuse - now stopped x 98month, congratulated pt on this achievement  8. Depression Was well controlled on welbutrin and celexa, now having "thoughts" since ran out of meds 3-4 days ago, - renewed welbutrin and celexa - encouraged her to seek immediate medical attention if has plans of SI, etc. - currently no active plans, no avh/hi.  9. Flu vaccine need - Flu Vaccine QUAD 36+ mos PF IM (Fluarix & Fluzone Quad PF)  10. Pneumococcal 23v today.  Return in about 1 month (around 04/23/2016) for gi bleed.  The patient was given clear instructions to go to ER or return to medical center if symptoms don't improve, worsen or new problems develop. The patient verbalized understanding. The patient was told to call to get lab results if they haven't heard anything in the next  week.    This note has been created with DSurveyor, quantity Any transcriptional errors are unintentional.   DMaren Reamer MD, [place]  03/24/2016, 12:50 PM

## 2016-03-28 ENCOUNTER — Other Ambulatory Visit: Payer: Self-pay | Admitting: Internal Medicine

## 2016-03-28 DIAGNOSIS — R7989 Other specified abnormal findings of blood chemistry: Secondary | ICD-10-CM

## 2016-03-30 ENCOUNTER — Telehealth: Payer: Self-pay | Admitting: Internal Medicine

## 2016-03-30 NOTE — Telephone Encounter (Signed)
Pt called requesting lab results. Please f/up

## 2016-03-31 ENCOUNTER — Telehealth: Payer: Self-pay

## 2016-03-31 NOTE — Telephone Encounter (Signed)
Contacted pt to go over lab results pt is aware of results and doesn't have any questions or concerns pt is schedule for a lab appointment 04/01/16 @ 11:30am

## 2016-04-01 ENCOUNTER — Other Ambulatory Visit: Payer: Medicare Other

## 2016-04-04 ENCOUNTER — Other Ambulatory Visit: Payer: Medicare Other

## 2016-04-06 ENCOUNTER — Ambulatory Visit
Admission: RE | Admit: 2016-04-06 | Discharge: 2016-04-06 | Disposition: A | Discharge status: Home / Self Care | Payer: Medicare Other | Source: Ambulatory Visit | Attending: Internal Medicine | Admitting: Internal Medicine

## 2016-04-06 ENCOUNTER — Inpatient Hospital Stay: Admission: RE | Admit: 2016-04-06 | Payer: Medicare Other | Source: Ambulatory Visit

## 2016-04-06 ENCOUNTER — Encounter: Payer: Self-pay | Admitting: Internal Medicine

## 2016-04-06 ENCOUNTER — Ambulatory Visit: Payer: Medicare Other

## 2016-04-06 DIAGNOSIS — N63 Unspecified lump in unspecified breast: Secondary | ICD-10-CM

## 2016-04-06 DIAGNOSIS — R922 Inconclusive mammogram: Secondary | ICD-10-CM | POA: Diagnosis not present

## 2016-04-06 NOTE — Progress Notes (Signed)
Gi notes/ [facility], pt saw DR Wilford Corner, MD 04/05/16 - scope scheduled for 04/12/16 - stool softeners/colace otc started.

## 2016-04-14 ENCOUNTER — Other Ambulatory Visit: Payer: Medicare Other

## 2016-04-15 ENCOUNTER — Telehealth: Payer: Self-pay

## 2016-04-15 ENCOUNTER — Other Ambulatory Visit: Payer: Medicare Other

## 2016-04-15 NOTE — Telephone Encounter (Signed)
Contacted pt to go over MM results pt didn't answer lvm making pt aware that mm was normal and if she has any questions or concerns to give me a call at the office

## 2016-04-28 MED FILL — LISINOPRIL-HCTZ 20-25 MG TA: 20-25 | 30 days supply | Qty: 30 | Fill #0

## 2016-05-05 ENCOUNTER — Telehealth: Payer: Self-pay | Admitting: Internal Medicine

## 2016-05-05 MED ORDER — TRAZODONE HCL 50 MG PO TABS: 50.0000 mg | ORAL_TABLET | Freq: Every evening | ORAL | 0 refills | Status: DC | PRN

## 2016-05-05 MED ORDER — LISINOPRIL-HYDROCHLOROTHIAZIDE 20-25 MG PO TABS: 1.0000 | ORAL_TABLET | Freq: Every day | ORAL | 0 refills | Status: DC

## 2016-05-05 MED ORDER — BUPROPION HCL ER (XL) 300 MG PO TB24: 300.0000 mg | ORAL_TABLET | Freq: Every day | ORAL | 1 refills | Status: DC

## 2016-05-05 MED ORDER — CITALOPRAM HYDROBROMIDE 40 MG PO TABS: 40.0000 mg | ORAL_TABLET | Freq: Every day | ORAL | 0 refills | Status: DC

## 2016-05-05 MED FILL — TRILYTE WITH FLAVOR PACKETS: 420 | 1 days supply | Qty: 4000 | Fill #0

## 2016-05-05 NOTE — Telephone Encounter (Signed)
Refilled lisinopril-HCTZ. I am not sure what the other medications are so I just refilled all of her chronic medications.

## 2016-05-05 NOTE — Telephone Encounter (Signed)
Patient called the office to request medication refill for lisinopril-hydrochlorothiazide (PRINZIDE,ZESTORETIC) 20-25 MG tablet, triadyte and vololax. Please send prescription to our pharmacy [facility]).  Thank you.

## 2016-05-19 DIAGNOSIS — K640 First degree hemorrhoids: Secondary | ICD-10-CM | POA: Diagnosis not present

## 2016-05-19 DIAGNOSIS — D126 Benign neoplasm of colon, unspecified: Secondary | ICD-10-CM | POA: Diagnosis not present

## 2016-05-19 DIAGNOSIS — D123 Benign neoplasm of transverse colon: Secondary | ICD-10-CM | POA: Diagnosis not present

## 2016-05-19 DIAGNOSIS — K5900 Constipation, unspecified: Secondary | ICD-10-CM | POA: Diagnosis not present

## 2016-05-19 DIAGNOSIS — K921 Melena: Secondary | ICD-10-CM | POA: Diagnosis not present

## 2016-05-19 DIAGNOSIS — K635 Polyp of colon: Secondary | ICD-10-CM | POA: Diagnosis not present

## 2016-05-19 DIAGNOSIS — D124 Benign neoplasm of descending colon: Secondary | ICD-10-CM | POA: Diagnosis not present

## 2016-05-24 DIAGNOSIS — K635 Polyp of colon: Secondary | ICD-10-CM | POA: Diagnosis not present

## 2016-05-24 DIAGNOSIS — D126 Benign neoplasm of colon, unspecified: Secondary | ICD-10-CM | POA: Diagnosis not present

## 2016-06-06 MED FILL — HYDROCORTISONE AC 25 MG SUP: 25 | 20 days supply | Qty: 20 | Fill #0

## 2016-07-13 MED FILL — LISINOPRIL-HCTZ 20-25 MG TA: 20-25 | 30 days supply | Qty: 30 | Fill #1

## 2016-07-13 MED FILL — BUPROPION HCL XL 300 MG TAB: 300 | 30 days supply | Qty: 30 | Fill #0

## 2016-07-13 MED FILL — CITALOPRAM HBR 40 MG TABLET: 40 | 30 days supply | Qty: 30 | Fill #0

## 2016-07-26 ENCOUNTER — Encounter: Payer: Self-pay | Admitting: Sports Medicine

## 2016-07-26 ENCOUNTER — Ambulatory Visit (INDEPENDENT_AMBULATORY_CARE_PROVIDER_SITE_OTHER): Payer: Medicare Other | Admitting: Sports Medicine

## 2016-07-26 DIAGNOSIS — M170 Bilateral primary osteoarthritis of knee: Secondary | ICD-10-CM

## 2016-07-26 MED ORDER — METHYLPREDNISOLONE ACETATE 40 MG/ML IJ SUSP
40.0000 mg | Freq: Once | INTRAMUSCULAR | Status: AC
  Administered 2016-07-26: 40 mg via INTRA_ARTICULAR

## 2016-07-26 NOTE — Progress Notes (Signed)
    Subjective:  Kristen Chavez is a 66 y.o. female who presents to the [facility] today with a chief complaint of bilateral knee pain.   HPI:  Bilateral Knee Pain Patient with a history of bilaterall knee OA. She received an injection about a year ago and she does not remember if it helps with her symptoms. Pain has been significantly worse over the past month and she has noticed increased swelling in her right knee. She has been using tylenol as needed for the pain which helps some. Pain is worse with movement and better with rest.   ROS: Per HPI  PMH: Smoking history reviewed.   Objective:  Physical Exam: BP 130/60   Ht '5\' 5"'$  (1.651 m)   Wt 160 lb (72.6 kg)   BMI 26.63 kg/m   Gen: NAD, resting comfortably MSK: - R knee: Mild effusion noted. Tender to palpation along medial and lateral joint lines. FROM. Patellofemoral crepitus with active ROM. Strength 5/5 in all fields. Neurovascularly intact. - L Knee: No effusion noted. Tender to palpation along medial and lateral joint lines. FROM. Patellofemoral crepitus with active ROM. Strength 5/5 in all fields.  - Gait: Walks with a limp  Knee Injection Procedure Note  Pre-operative Diagnosis: bilateral osteoarthritis  Post-operative Diagnosis: same  Anesthesia: Topical Ethyl Chloride  Procedure Details   Verbal consent was obtained for the procedure. The joint was prepped with Betadine. A 22 gauge needle was inserted into the lateral joint line.  3 ml 1% lidocaine and 1 ml of depo-medrol was then injected into the joint. The needle was removed and the area cleansed and dressed. The same procedure was repeat for the contralateral knee.   Complications:  None; patient tolerated the procedure well.  Assessment/Plan:  Osteoarthritis Discussed treatment options with patient. Bilateral steroid injection today (see above procedure note). At this time, patient is completely against surgery. Can consider viscosupplementation injections -  discussed this with patient today. Follow up in 3-4 weeks.   Algis Greenhouse. Jerline Pain, [place] Resident PGY-3 07/26/2016 3:38 PM   Patient seen and evaluated with the resident. I agree with the above plan of care. If today's injections do not help, I will need to get updated imaging of her knees. She has known bilateral knee DJD and definitive treatment is a total knee arthroplasty but she is not yet ready to pursue this.

## 2016-07-26 NOTE — Assessment & Plan Note (Signed)
Discussed treatment options with patient. Bilateral steroid injection today (see above procedure note). At this time, patient is completely against surgery. Can consider viscosupplementation injections - discussed this with patient today. Follow up in 3-4 weeks.

## 2016-08-29 ENCOUNTER — Ambulatory Visit: Payer: Medicare Other | Admitting: Sports Medicine

## 2016-08-29 MED FILL — CITALOPRAM HBR 40 MG TABLET: 40 | 30 days supply | Qty: 30 | Fill #1

## 2016-08-29 MED FILL — BUPROPION HCL XL 300 MG TAB: 300 | 30 days supply | Qty: 30 | Fill #1

## 2016-08-29 MED FILL — LISINOPRIL-HCTZ 20-25 MG TA: 20-25 | 30 days supply | Qty: 30 | Fill #2

## 2016-09-05 ENCOUNTER — Encounter: Payer: Self-pay | Admitting: Sports Medicine

## 2016-09-05 ENCOUNTER — Ambulatory Visit (INDEPENDENT_AMBULATORY_CARE_PROVIDER_SITE_OTHER): Payer: Medicare Other | Admitting: Sports Medicine

## 2016-09-05 VITALS — BP 112/82 | HR 105 | Ht 65.0 in | Wt 160.0 lb

## 2016-09-05 DIAGNOSIS — M170 Bilateral primary osteoarthritis of knee: Secondary | ICD-10-CM

## 2016-09-05 NOTE — Progress Notes (Signed)
   Subjective:    Patient ID: Kristen Chavez, female    DOB: 28-Feb-1951, 66 y.o.   MRN: [id number]  HPI Kristen Chavez comes in today for follow-up on bilateral knee pain. Pain has improved dramatically after recent bilateral cortisone injections. Still has a slight amount of pain and swelling in the right knee but it is much better than before. She is exercising without any complaint.       Review of Systems     Objective:   Physical Exam Well-developed, well-nourished. No acute distress  Examination of both knees shows good range of motion. No obvious effusion. No bony or soft tissue tenderness to palpation. Negative McMurray's. Neurovascular intact distally.       Assessment & Plan   Improved bilateral knee pain secondary to DJD  Patient is educated in a home exercise program consisting of isometric quad exercises and hamstring strengthening exercises. She will incorporate these into her daily workouts. At this point in time I do not need to pursue any further workup or treatment. She'll follow-up with me as needed.

## 2016-10-11 MED FILL — BUPROPION HCL XL 300 MG TAB: 300 | 30 days supply | Qty: 30 | Fill #2

## 2016-10-11 MED FILL — CITALOPRAM HBR 40 MG TABLET: 40 | 30 days supply | Qty: 30 | Fill #2

## 2016-10-11 MED FILL — LISINOPRIL-HCTZ 20-25 MG TA: 20-25 | 30 days supply | Qty: 30 | Fill #3

## 2016-10-14 ENCOUNTER — Telehealth: Payer: Self-pay

## 2016-10-14 NOTE — Telephone Encounter (Signed)
Ca to find out pt would like to have colonoscopy scheduled

## 2016-11-14 MED FILL — BUPROPION HCL XL 300 MG TAB: 300 | 30 days supply | Qty: 30 | Fill #3

## 2016-11-14 MED FILL — LISINOPRIL-HCTZ 20-25 MG TA: 20-25 | 30 days supply | Qty: 30 | Fill #4

## 2016-11-14 MED FILL — CITALOPRAM HBR 40 MG TABLET: 40 | 30 days supply | Qty: 30 | Fill #0

## 2016-11-24 ENCOUNTER — Encounter: Payer: Self-pay | Admitting: Internal Medicine

## 2016-11-25 ENCOUNTER — Encounter: Payer: Self-pay | Admitting: Internal Medicine

## 2016-11-28 ENCOUNTER — Encounter: Payer: Self-pay | Admitting: Internal Medicine

## 2016-12-22 MED FILL — BUPROPION HCL XL 300 MG TAB: 300 | 30 days supply | Qty: 30 | Fill #4

## 2016-12-22 MED FILL — CITALOPRAM HBR 40 MG TABLET: 40 | 30 days supply | Qty: 30 | Fill #1

## 2016-12-22 MED FILL — LISINOPRIL-HCTZ 20-25 MG TA: 20-25 | 30 days supply | Qty: 30 | Fill #5

## 2017-01-16 DIAGNOSIS — K1120 Sialoadenitis, unspecified: Secondary | ICD-10-CM | POA: Diagnosis not present

## 2017-02-01 ENCOUNTER — Telehealth: Payer: Self-pay | Admitting: Internal Medicine

## 2017-02-01 NOTE — Telephone Encounter (Signed)
Pt called to try to speak with the pharmacy she was on hold for almost an hour and also has problem trying to connect with [place] too, she want the office manager co tall her back when you have a time

## 2017-02-01 NOTE — Telephone Encounter (Signed)
Pt called to request a referral for and Ophthalmology and Audiology, she know that will see a new PCP on 02/10/17 but she want to let her know what she need

## 2017-02-01 NOTE — Telephone Encounter (Signed)
Okay, will address at upcoming visit.

## 2017-02-10 ENCOUNTER — Encounter: Payer: Self-pay | Admitting: Family Medicine

## 2017-02-10 ENCOUNTER — Ambulatory Visit: Payer: Medicare Other | Attending: Family Medicine | Admitting: Family Medicine

## 2017-02-10 VITALS — BP 138/87 | HR 68 | Temp 98.1°F | Resp 18 | Ht 65.0 in | Wt 158.0 lb

## 2017-02-10 DIAGNOSIS — Z1231 Encounter for screening mammogram for malignant neoplasm of breast: Secondary | ICD-10-CM | POA: Diagnosis not present

## 2017-02-10 DIAGNOSIS — H538 Other visual disturbances: Secondary | ICD-10-CM | POA: Diagnosis not present

## 2017-02-10 DIAGNOSIS — Z0000 Encounter for general adult medical examination without abnormal findings: Secondary | ICD-10-CM

## 2017-02-10 DIAGNOSIS — F419 Anxiety disorder, unspecified: Secondary | ICD-10-CM | POA: Diagnosis not present

## 2017-02-10 DIAGNOSIS — Z79899 Other long term (current) drug therapy: Secondary | ICD-10-CM | POA: Insufficient documentation

## 2017-02-10 DIAGNOSIS — Z131 Encounter for screening for diabetes mellitus: Secondary | ICD-10-CM

## 2017-02-10 DIAGNOSIS — Z760 Encounter for issue of repeat prescription: Secondary | ICD-10-CM | POA: Diagnosis not present

## 2017-02-10 DIAGNOSIS — E2839 Other primary ovarian failure: Secondary | ICD-10-CM

## 2017-02-10 DIAGNOSIS — I10 Essential (primary) hypertension: Secondary | ICD-10-CM | POA: Diagnosis not present

## 2017-02-10 DIAGNOSIS — Z23 Encounter for immunization: Secondary | ICD-10-CM | POA: Diagnosis not present

## 2017-02-10 DIAGNOSIS — Z1239 Encounter for other screening for malignant neoplasm of breast: Secondary | ICD-10-CM

## 2017-02-10 DIAGNOSIS — F329 Major depressive disorder, single episode, unspecified: Secondary | ICD-10-CM | POA: Diagnosis not present

## 2017-02-10 DIAGNOSIS — H9193 Unspecified hearing loss, bilateral: Secondary | ICD-10-CM | POA: Diagnosis not present

## 2017-02-10 LAB — POCT GLYCOSYLATED HEMOGLOBIN (HGB A1C): HEMOGLOBIN A1C: 5.4

## 2017-02-10 MED ORDER — BUPROPION HCL ER (XL) 300 MG PO TB24: 300.0000 mg | ORAL_TABLET | Freq: Every day | ORAL | 1 refills | Status: DC

## 2017-02-10 MED ORDER — LISINOPRIL-HYDROCHLOROTHIAZIDE 20-25 MG PO TABS: 1.0000 | ORAL_TABLET | Freq: Every day | ORAL | 0 refills | Status: DC

## 2017-02-10 MED ORDER — CITALOPRAM HYDROBROMIDE 40 MG PO TABS: 40.0000 mg | ORAL_TABLET | Freq: Every day | ORAL | 1 refills | Status: DC

## 2017-02-10 MED FILL — LISINOPRIL-HCTZ 20-25 MG TA: 20-25 | 30 days supply | Qty: 30 | Fill #6

## 2017-02-10 MED FILL — CITALOPRAM HBR 40 MG TABLET: 40 | 30 days supply | Qty: 30 | Fill #2

## 2017-02-10 MED FILL — BUPROPION HCL XL 300 MG TAB: 300 | 30 days supply | Qty: 30 | Fill #5

## 2017-02-10 NOTE — Progress Notes (Signed)
Subjective:  Patient ID: [name], female    DOB: October 21, 1950  Age: 66 y.o. MRN: [id number]  CC: Hypertension   HPI Kristen Chavez presents for hypertension. She is not exercising and is not adherent to low salt diet.  She does not check BP at home. Cardiac symptoms none. Patient denies chest pain, chest pressure/discomfort, claudication, dyspnea, lower extremity edema, near-syncope, palpitations and syncope.  Cardiovascular risk factors: hypertension and sedentary lifestyle. Use of agents associated with hypertension: none. History of target organ damage: none. He reports not taking her antihypertensive medications. She also complains of bilateral decreased hearing which began several years ago and became progressively worse last year. She does report Q-tip use, denies any drainage, history of chronic ear infections, or ear trauma. She also complains of reduced visual acuity. Symptoms began several years ago but worsened 2 months ago. She denies any headaches or dizziness. History of depression and anxiety. She reports symptoms are well controlled on current medications. She denies any SI/HI. She declines speaking with LCSW at this time.   Outpatient Medications Prior to Visit  Medication Sig Dispense Refill  . acetaminophen (TYLENOL) 500 MG tablet Take 500 mg by mouth every 6 (six) hours as needed for moderate pain or headache.     . Flavoring Agent (GRAPEFRUIT FLAVOR) OIL Apply 1 application topically daily as needed (knee pain). White grapefruit oil    . hydrocortisone (ANUSOL-HC) 25 MG suppository   3  . Lavender Oil OIL Apply 1 application topically 2 (two) times daily as needed (knee pain).    . Multiple Vitamin (MULTIVITAMIN WITH MINERALS) TABS tablet Take 1 tablet by mouth daily.    Marland Kitchen buPROPion (WELLBUTRIN XL) 300 MG 24 hr tablet Take 1 tablet (300 mg total) by mouth daily. 90 tablet 1  . citalopram (CELEXA) 40 MG tablet Take 1 tablet (40 mg total) by mouth daily. 90 tablet 0  .  lisinopril-hydrochlorothiazide (PRINZIDE,ZESTORETIC) 20-25 MG tablet Take 1 tablet by mouth daily. 90 tablet 0  . meloxicam (MOBIC) 15 MG tablet Take one a day 20 tablet 0  . traZODone (DESYREL) 50 MG tablet Take 1 tablet (50 mg total) by mouth at bedtime as needed for sleep. 15 tablet 0   No facility-administered medications prior to visit.     ROS Review of Systems  Constitutional: Negative.   HENT: Positive for hearing loss.   Eyes: Positive for visual disturbance.  Respiratory: Negative.   Cardiovascular: Negative.   Gastrointestinal: Negative.   Skin: Negative.   Neurological: Negative.   Psychiatric/Behavioral: Negative for suicidal ideas.   Objective:  BP 138/87 (BP Location: Left Arm, Patient Position: Sitting, Cuff Size: Large)   Pulse 68   Temp 98.1 F (36.7 C) (Oral)   Resp 18   Ht _0  (1.651 m)   Wt 158 lb (71.7 kg)   SpO2 100%   BMI 26.29 kg/m   BP/Weight 02/10/2017 09/05/2016 3/76/2831  Systolic BP 517 616 073  Diastolic BP 87 82 60  Wt. (Lbs) 158 160 160  BMI 26.29 26.63 26.63     Physical Exam  Constitutional: She appears well-developed and well-nourished.  HENT:  Head: Normocephalic and atraumatic.  Right Ear: External ear normal.  Left Ear: External ear normal.  Nose: Nose normal.  Mouth/Throat: Oropharynx is clear and moist.  Eyes: Pupils are equal, round, and reactive to light. Conjunctivae and EOM are normal. Right eye exhibits no discharge. Left eye exhibits no discharge.  Neck: Normal range of motion. Neck  supple. No JVD present.  Cardiovascular: Normal rate, regular rhythm, normal heart sounds and intact distal pulses.   Pulmonary/Chest: Effort normal and breath sounds normal.  Abdominal: Soft. Bowel sounds are normal. There is no tenderness.  Skin: Skin is warm and dry.  Psychiatric: She has a normal mood and affect. She expresses no homicidal and no suicidal ideation. She expresses no suicidal plans and no homicidal plans.  Nursing note  and vitals reviewed.   Assessment & Plan:   Problem List Items Addressed This Visit      Cardiovascular and Mediastinum   Essential hypertension - Primary   Relevant Medications   lisinopril-hydrochlorothiazide (PRINZIDE,ZESTORETIC) 20-25 MG tablet   Other Relevant Orders   CMP14+EGFR (Completed)   Lipid Panel (Completed)    Other Visit Diagnoses    Blurry vision, bilateral       Relevant Orders   POCT glycosylated hemoglobin (Hb A1C) (Completed)   Decreased hearing of both ears       Relevant Orders   Ambulatory referral to Audiology   Estrogen deficiency       Relevant Orders   DG Bone Density   Screening for breast cancer       Relevant Orders   MM SCREENING BREAST TOMO BILATERAL   Health care maintenance       Relevant Orders   Tdap vaccine greater than or equal to 7yo IM (Completed)   Hepatitis C Antibody (Completed)   Screening for diabetes mellitus       Relevant Orders   POCT glycosylated hemoglobin (Hb A1C) (Completed)   Medication refill       Relevant Medications   citalopram (CELEXA) 40 MG tablet   buPROPion (WELLBUTRIN XL) 300 MG 24 hr tablet      Meds ordered this encounter  Medications  . lisinopril-hydrochlorothiazide (PRINZIDE,ZESTORETIC) 20-25 MG tablet    Sig: Take 1 tablet by mouth daily.    Dispense:  90 tablet    Refill:  0    Order Specific Question:   Supervising Provider    Answer:   Tresa Garter [id number]  . citalopram (CELEXA) 40 MG tablet    Sig: Take 1 tablet (40 mg total) by mouth daily.    Dispense:  90 tablet    Refill:  1    Order Specific Question:   Supervising Provider    Answer:   Tresa Garter [id number]  . buPROPion (WELLBUTRIN XL) 300 MG 24 hr tablet    Sig: Take 1 tablet (300 mg total) by mouth daily.    Dispense:  90 tablet    Refill:  1    Order Specific Question:   Supervising Provider    Answer:   Tresa Garter [[id number]]     Alfonse Spruce FNP

## 2017-02-10 NOTE — Patient Instructions (Signed)
DASH Eating Plan DASH stands for "Dietary Approaches to Stop Hypertension." The DASH eating plan is a healthy eating plan that has been shown to reduce high blood pressure (hypertension). It may also reduce your risk for type 2 diabetes, heart disease, and stroke. The DASH eating plan may also help with weight loss. What are tips for following this plan? General guidelines  Avoid eating more than 2,300 mg (milligrams) of salt (sodium) a day. If you have hypertension, you may need to reduce your sodium intake to 1,500 mg a day.  Limit alcohol intake to no more than 1 drink a day for nonpregnant women and 2 drinks a day for men. One drink equals 12 oz of beer, 5 oz of wine, or 1 oz of hard liquor.  Work with your health care provider to maintain a healthy body weight or to lose weight. Ask what an ideal weight is for you.  Get at least 30 minutes of exercise that causes your heart to beat faster (aerobic exercise) most days of the week. Activities may include walking, swimming, or biking.  Work with your health care provider or diet and nutrition specialist (dietitian) to adjust your eating plan to your individual calorie needs. Reading food labels  Check food labels for the amount of sodium per serving. Choose foods with less than 5 percent of the Daily Value of sodium. Generally, foods with less than 300 mg of sodium per serving fit into this eating plan.  To find whole grains, look for the word "whole" as the first word in the ingredient list. Shopping  Buy products labeled as "low-sodium" or "no salt added."  Buy fresh foods. Avoid canned foods and premade or frozen meals. Cooking  Avoid adding salt when cooking. Use salt-free seasonings or herbs instead of table salt or sea salt. Check with your health care provider or pharmacist before using salt substitutes.  Do not fry foods. Cook foods using healthy methods such as baking, boiling, grilling, and broiling instead.  Cook with  heart-healthy oils, such as olive, canola, soybean, or sunflower oil. Meal planning   Eat a balanced diet that includes: ? 5 or more servings of fruits and vegetables each day. At each meal, try to fill half of your plate with fruits and vegetables. ? Up to 6-8 servings of whole grains each day. ? Less than 6 oz of lean meat, poultry, or fish each day. A 3-oz serving of meat is about the same size as a deck of cards. One egg equals 1 oz. ? 2 servings of low-fat dairy each day. ? A serving of nuts, seeds, or beans 5 times each week. ? Heart-healthy fats. Healthy fats called Omega-3 fatty acids are found in foods such as flaxseeds and coldwater fish, like sardines, salmon, and mackerel.  Limit how much you eat of the following: ? Canned or prepackaged foods. ? Food that is high in trans fat, such as fried foods. ? Food that is high in saturated fat, such as fatty meat. ? Sweets, desserts, sugary drinks, and other foods with added sugar. ? Full-fat dairy products.  Do not salt foods before eating.  Try to eat at least 2 vegetarian meals each week.  Eat more home-cooked food and less restaurant, buffet, and fast food.  When eating at a restaurant, ask that your food be prepared with less salt or no salt, if possible. What foods are recommended? The items listed may not be a complete list. Talk with your dietitian about what   dietary choices are best for you. Grains Whole-grain or whole-wheat bread. Whole-grain or whole-wheat pasta. Brown rice. Oatmeal. Quinoa. Bulgur. Whole-grain and low-sodium cereals. Pita bread. Low-fat, low-sodium crackers. Whole-wheat flour tortillas. Vegetables Fresh or frozen vegetables (raw, steamed, roasted, or grilled). Low-sodium or reduced-sodium tomato and vegetable juice. Low-sodium or reduced-sodium tomato sauce and tomato paste. Low-sodium or reduced-sodium canned vegetables. Fruits All fresh, dried, or frozen fruit. Canned fruit in natural juice (without  added sugar). Meat and other protein foods Skinless chicken or turkey. Ground chicken or turkey. Pork with fat trimmed off. Fish and seafood. Egg whites. Dried beans, peas, or lentils. Unsalted nuts, nut butters, and seeds. Unsalted canned beans. Lean cuts of beef with fat trimmed off. Low-sodium, lean deli meat. Dairy Low-fat (1%) or fat-free (skim) milk. Fat-free, low-fat, or reduced-fat cheeses. Nonfat, low-sodium ricotta or cottage cheese. Low-fat or nonfat yogurt. Low-fat, low-sodium cheese. Fats and oils Soft margarine without trans fats. Vegetable oil. Low-fat, reduced-fat, or light mayonnaise and salad dressings (reduced-sodium). Canola, safflower, olive, soybean, and sunflower oils. Avocado. Seasoning and other foods Herbs. Spices. Seasoning mixes without salt. Unsalted popcorn and pretzels. Fat-free sweets. What foods are not recommended? The items listed may not be a complete list. Talk with your dietitian about what dietary choices are best for you. Grains Baked goods made with fat, such as croissants, muffins, or some breads. Dry pasta or rice meal packs. Vegetables Creamed or fried vegetables. Vegetables in a cheese sauce. Regular canned vegetables (not low-sodium or reduced-sodium). Regular canned tomato sauce and paste (not low-sodium or reduced-sodium). Regular tomato and vegetable juice (not low-sodium or reduced-sodium). Pickles. Olives. Fruits Canned fruit in a light or heavy syrup. Fried fruit. Fruit in cream or butter sauce. Meat and other protein foods Fatty cuts of meat. Ribs. Fried meat. Bacon. Sausage. Bologna and other processed lunch meats. Salami. Fatback. Hotdogs. Bratwurst. Salted nuts and seeds. Canned beans with added salt. Canned or smoked fish. Whole eggs or egg yolks. Chicken or turkey with skin. Dairy Whole or 2% milk, cream, and half-and-half. Whole or full-fat cream cheese. Whole-fat or sweetened yogurt. Full-fat cheese. Nondairy creamers. Whipped toppings.  Processed cheese and cheese spreads. Fats and oils Butter. Stick margarine. Lard. Shortening. Ghee. Bacon fat. Tropical oils, such as coconut, palm kernel, or palm oil. Seasoning and other foods Salted popcorn and pretzels. Onion salt, garlic salt, seasoned salt, table salt, and sea salt. Worcestershire sauce. Tartar sauce. Barbecue sauce. Teriyaki sauce. Soy sauce, including reduced-sodium. Steak sauce. Canned and packaged gravies. Fish sauce. Oyster sauce. Cocktail sauce. Horseradish that you find on the shelf. Ketchup. Mustard. Meat flavorings and tenderizers. Bouillon cubes. Hot sauce and Tabasco sauce. Premade or packaged marinades. Premade or packaged taco seasonings. Relishes. Regular salad dressings. Where to find more information:  National Heart, Lung, and Blood Institute: www.nhlbi.nih.gov  American Heart Association: www.heart.org Summary  The DASH eating plan is a healthy eating plan that has been shown to reduce high blood pressure (hypertension). It may also reduce your risk for type 2 diabetes, heart disease, and stroke.  With the DASH eating plan, you should limit salt (sodium) intake to 2,300 mg a day. If you have hypertension, you may need to reduce your sodium intake to 1,500 mg a day.  When on the DASH eating plan, aim to eat more fresh fruits and vegetables, whole grains, lean proteins, low-fat dairy, and heart-healthy fats.  Work with your health care provider or diet and nutrition specialist (dietitian) to adjust your eating plan to your individual   calorie needs. This information is not intended to replace advice given to you by your health care provider. Make sure you discuss any questions you have with your health care provider. Document Released: 06/16/2011 Document Revised: 06/20/2016 Document Reviewed: 06/20/2016 Elsevier Interactive Patient Education  2017 Elsevier Inc.  

## 2017-02-11 LAB — LIPID PANEL
Chol/HDL Ratio: 2 ratio (ref 0.0–4.4)
Cholesterol, Total: 166 mg/dL (ref 100–199)
HDL: 84 mg/dL (ref >39)
LDL Calculated: 68 mg/dL (ref 0–99)
TRIGLYCERIDES: 68 mg/dL (ref 0–149)
VLDL CHOLESTEROL CAL: 14 mg/dL (ref 5–40)

## 2017-02-11 LAB — CMP14+EGFR
A/G RATIO: 1.6 (ref 1.2–2.2)
ALT: 17 IU/L (ref 0–32)
AST: 20 IU/L (ref 0–40)
Albumin: 4.6 g/dL (ref 3.6–4.8)
Alkaline Phosphatase: 98 IU/L (ref 39–117)
BILIRUBIN TOTAL: 0.4 mg/dL (ref 0.0–1.2)
BUN/Creatinine Ratio: 18 (ref 12–28)
BUN: 13 mg/dL (ref 8–27)
CALCIUM: 9.7 mg/dL (ref 8.7–10.3)
CHLORIDE: 104 mmol/L (ref 96–106)
CO2: 22 mmol/L (ref 20–29)
Creatinine, Ser: 0.73 mg/dL (ref 0.57–1.00)
GFR calc Af Amer: 98 mL/min/{1.73_m2} (ref >59)
GFR, EST NON AFRICAN AMERICAN: 85 mL/min/{1.73_m2} (ref >59)
GLUCOSE: 89 mg/dL (ref 65–99)
Globulin, Total: 2.9 g/dL (ref 1.5–4.5)
POTASSIUM: 4.4 mmol/L (ref 3.5–5.2)
Sodium: 143 mmol/L (ref 134–144)
TOTAL PROTEIN: 7.5 g/dL (ref 6.0–8.5)

## 2017-02-11 LAB — HEPATITIS C ANTIBODY: Hep C Virus Ab: <0.1 s/co ratio (ref 0.0–0.9)

## 2017-02-20 ENCOUNTER — Telehealth: Payer: Self-pay | Admitting: Family Medicine

## 2017-02-20 NOTE — Telephone Encounter (Signed)
Labs resulted please notify pt.

## 2017-02-20 NOTE — Telephone Encounter (Signed)
Pt called requesting lab results , please f/up

## 2017-02-20 NOTE — Telephone Encounter (Signed)
Pt called requesting lab results , I already check her chart, labs are final but no notes from the provider.

## 2017-02-21 NOTE — Telephone Encounter (Signed)
CMA call regarding lab results  Patient did not answer but left a detailed message per [facility] & stated if have any questions just to call back

## 2017-02-21 NOTE — Telephone Encounter (Signed)
-----   Message from Alfonse Spruce, [place] sent at 02/20/2017  5:03 PM EDT ----- -Hepatitis C is negative.  Kidney function normal Liver function normal Cholesterol level are normal.

## 2017-03-01 NOTE — Telephone Encounter (Signed)
Patient return CMA call  Patient verify DOB   Patient was aware and understood

## 2017-03-28 MED FILL — BUPROPION HCL XL 300 MG TAB: 300 | 90 days supply | Qty: 90 | Fill #0

## 2017-03-28 MED FILL — LISINOPRIL-HCTZ 20-25 MG TA: 20-25 | 90 days supply | Qty: 90 | Fill #0

## 2017-03-28 MED FILL — CITALOPRAM HBR 40 MG TABLET: 40 | 90 days supply | Qty: 90 | Fill #0

## 2017-04-07 ENCOUNTER — Ambulatory Visit: Payer: Medicare Other

## 2017-04-07 ENCOUNTER — Inpatient Hospital Stay: Admission: RE | Admit: 2017-04-07 | Payer: Medicare Other | Source: Ambulatory Visit

## 2017-04-25 ENCOUNTER — Ambulatory Visit: Payer: Medicare Other

## 2017-04-25 ENCOUNTER — Other Ambulatory Visit: Payer: Medicare Other

## 2017-05-01 ENCOUNTER — Telehealth: Payer: Self-pay | Admitting: Family Medicine

## 2017-05-01 NOTE — Telephone Encounter (Signed)
Patient called requesting an optometry referral, pt states she mentioned it during the visit , states she is having vision issues . Please f/up

## 2017-05-03 NOTE — Telephone Encounter (Signed)
Patient called requesting an optometry referral, pt states she mentioned it during the visit , states she is having vision issues . Please advice?

## 2017-05-05 ENCOUNTER — Other Ambulatory Visit: Payer: Self-pay | Admitting: Internal Medicine

## 2017-05-05 DIAGNOSIS — H538 Other visual disturbances: Secondary | ICD-10-CM

## 2017-05-05 NOTE — Telephone Encounter (Signed)
Referred.

## 2017-06-05 ENCOUNTER — Telehealth: Payer: Self-pay | Admitting: Family Medicine

## 2017-06-05 DIAGNOSIS — I10 Essential (primary) hypertension: Secondary | ICD-10-CM

## 2017-06-05 DIAGNOSIS — Z760 Encounter for issue of repeat prescription: Secondary | ICD-10-CM

## 2017-06-05 MED ORDER — BUPROPION HCL ER (XL) 300 MG PO TB24: 300.0000 mg | ORAL_TABLET | Freq: Every day | ORAL | 0 refills | Status: DC

## 2017-06-05 MED ORDER — CITALOPRAM HYDROBROMIDE 40 MG PO TABS: 40.0000 mg | ORAL_TABLET | Freq: Every day | ORAL | 0 refills | Status: DC

## 2017-06-05 MED ORDER — LISINOPRIL-HYDROCHLOROTHIAZIDE 20-25 MG PO TABS: 1.0000 | ORAL_TABLET | Freq: Every day | ORAL | 0 refills | Status: DC

## 2017-06-05 NOTE — Telephone Encounter (Signed)
Refilled requested meds x 30 days except hydrocortisone supp. She was not prescribed that by current PCP so will forward to PCP for review.

## 2017-06-05 NOTE — Telephone Encounter (Signed)
Pt called to request a refill for  hydrocortisone (ANUSOL-HC) 25 MG suppository  lisinopril-hydrochlorothiazide (PRINZIDE,ZESTORETIC) 20-25 MG tablet  buPROPion (WELLBUTRIN XL) 300 MG 24 hr tablet  citalopram (CELEXA) 40 MG tablet   Please sent it to [facility] pharmacy, please follow up

## 2017-06-08 ENCOUNTER — Other Ambulatory Visit: Payer: Self-pay | Admitting: Family Medicine

## 2017-06-08 DIAGNOSIS — I10 Essential (primary) hypertension: Secondary | ICD-10-CM

## 2017-06-08 DIAGNOSIS — Z760 Encounter for issue of repeat prescription: Secondary | ICD-10-CM

## 2017-06-08 NOTE — Telephone Encounter (Signed)
Hydrocortisone supp.will not be filled.

## 2017-06-13 ENCOUNTER — Ambulatory Visit: Payer: Medicare Other | Attending: Audiology | Admitting: Audiology

## 2017-06-13 DIAGNOSIS — H903 Sensorineural hearing loss, bilateral: Secondary | ICD-10-CM

## 2017-06-13 DIAGNOSIS — R94128 Abnormal results of other function studies of ear and other special senses: Secondary | ICD-10-CM | POA: Diagnosis not present

## 2017-06-13 DIAGNOSIS — Z01118 Encounter for examination of ears and hearing with other abnormal findings: Secondary | ICD-10-CM | POA: Insufficient documentation

## 2017-06-13 DIAGNOSIS — H9193 Unspecified hearing loss, bilateral: Secondary | ICD-10-CM | POA: Diagnosis not present

## 2017-06-13 DIAGNOSIS — H93299 Other abnormal auditory perceptions, unspecified ear: Secondary | ICD-10-CM | POA: Insufficient documentation

## 2017-06-13 MED FILL — CITALOPRAM HBR 40 MG TABLET: 40 | 90 days supply | Qty: 90 | Fill #1

## 2017-06-13 NOTE — Procedures (Signed)
[facility]  [place], [place] [address]  [phone]   Audiological Evaluation  Patient Name: Kristen Chavez   Status: Outpatient   DOB: 1951-01-01    Diagnosis: Bilateral Hearing Loss                 MRN: [id number] Date:  06/13/2017     Referent: Alfonse Spruce, FNP  History: [name] was seen for an audiological evaluation.  Primary Concern: Hearing problems, getting progressively worse.  Asking people to repeat a lot. Listens to the TV loudly. Pain: None History of ear infections:  Y - as a young child. 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  History of hypertension: Y - controlled with medication. History of diabetes:  N Family history of hearing loss: Y - Sister has hearing loss, but doesn't wear hearing aids.    Evaluation: Conventional pure tone audiometry from 250Hz  - 8000Hz  with using insert earphones.  Hearing Thresholds are symmetrical: 25 dBHLat 250Hz ; 30 dBHL at 250Hz ; 20 dBHL at 1000Hz -2000Hz ; 30 dBHL at 4000Hz ; 45 dBHL at 6000Hz  and 50-60 dBHL at 8000Hz . The hearing loss is sensorineural bilaterally. Reliability is good Speech reception levels (repeating words near threshold) using recorded spondee word lists:  Right ear: 25 dBHL.  Left ear:  20 dBHL Word recognition (at comfortably loud volumes) using recorded NU-6 word lists in quiet.  Right ear: 100% at 65 dBHL.  Left ear:   100% at 60 dBHL. Word recognition in minimal background noise:  +5 dBHL  Right ear: 76%                              Left ear:  56%  Tympanometry shows normal middle ear volume, pressure and compliance (Type A) with ipsilateral acoustic reflexes that agree with the degree of hearing loss at 85-90 dBHL from 500Hz  -1000Hz ; 90-95dB at 2000Hz  and No Response at 4000Hz   bilaterally.  CONCLUSION:      Kristen Chavez has normal to borderline mild hearing loss in the low and mid range sloping to a moderate high frequency sensorineural hearing loss bilaterally.  This amount of hearing loss would adversely  affect speech communication at normal conversational speech levels. Word recognition is excellent in each ear in quiet at loud conversational speech levels. In minimal background noise, word recognition drops  to fair on the right and poor in the left ear.  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 Therefore a hearing aid evaluation is recommended.  The test results were discussed and [name] counseled.  Amplification helps make the signal louder and therefore often improves hearing and word recognition.  Amplification has many forms including hearing aids in one or both ears, an assistive listening device which have a microphone and speaker such as a small handheld device and/or even a surround sound system of speakers.  Amplification may be covered by some insurances, but not all. Contact your insurance for "hearing aid benefits" and/or contact Vocational Rehabilitation.  It is important to note that hearing aids must be individually fit according to the hearing test results and the ear  shape.  Audiologists and hearing aid dealers in [place] must be licensed in order to dispense hearing aids.  In addition, a trial period is mandated by law in our state because often amplification must be tried and then evaluated in order to determine benefit.  There are many excellent choices when it comes to amplification in our area and providers are listed in the phone book under hearing aids, there are audiologists in private practice, those affiliated with Ear, Nose and Throat  physicians, and there are audiologists located at [place]: 1.   Monitor hearing closely with a repeat audiological evaluation in 6-12 months (earlier if there is any change in hearing or ear pressure).   2.   A hearing aid evaluation.  For payment options a) contact medicare regarding insurance benefits or b) vocational rehabilitation since [place] is working.  3.  Strategies that help improve hearing include: A) Face the speaker directly. Optimal is having the speakers face well - lit.  Unless amplified, being within 3-6 feet of the speaker will enhance word recognition. B) Avoid having the speaker back-lit as this will minimize the ability to use cues from lip-reading, facial expression and gestures. C)  Word recognition is poorer in background noise. For optimal word recognition, turn off the TV, radio or noisy fan when engaging in conversation. In a restaurant, try to sit away from noise sources and close to the primary speaker.  D)  Ask for topic clarification from time to time in order to remain in the conversation.  Most people don't mind repeating or clarifying a point when asked.  If needed, explain the difficulty hearing in background noise or hearing loss. 4.   Use hearing protection during noisy activities such as using a weed eater, moving the lawn, shooting, etc.    Musician's plugs, are available from [employer] for music related hearing protection because there is no distortion.  Other hearing protection, such as sponge plugs (available at pharmacies) or earmuffs (available at sporting goods stores or department stores such as Paediatric nurse) are useful for noisy activities and venues.  [name] [name], Au.D., CCC-A Doctor of Audiology 06/13/2017 cc: Alfonse Spruce, FNP

## 2017-06-21 ENCOUNTER — Telehealth: Payer: Self-pay | Admitting: *Deleted

## 2017-06-21 NOTE — Telephone Encounter (Signed)
Request RF on:  lisinopril-hydrochlorothiazide (PRINZIDE,ZESTORETIC) 20-25 MG tablet  And  buPROPion (WELLBUTRIN XL) 300 MG 24 hr tablet

## 2017-06-21 NOTE — Telephone Encounter (Signed)
Medications were filled for 30 day supply on 06/05/17. Too soon for refill. Last visit was 02/2017 . It was recommended she schedule an office visit for follow up.

## 2017-06-21 NOTE — Telephone Encounter (Signed)
CMA call regarding medication refill   Patient was aware and understood that is too soon for a refill & that she needs to scheduled an office visit

## 2017-07-28 ENCOUNTER — Ambulatory Visit: Payer: Medicare Other | Admitting: Family Medicine

## 2017-08-18 ENCOUNTER — Encounter: Payer: Self-pay | Admitting: Family Medicine

## 2017-08-18 ENCOUNTER — Ambulatory Visit: Payer: Medicare Other | Attending: Family Medicine | Admitting: Family Medicine

## 2017-08-18 VITALS — BP 178/118 | HR 81 | Temp 98.1°F | Ht 65.0 in | Wt 167.8 lb

## 2017-08-18 DIAGNOSIS — M79642 Pain in left hand: Secondary | ICD-10-CM | POA: Diagnosis not present

## 2017-08-18 DIAGNOSIS — I10 Essential (primary) hypertension: Secondary | ICD-10-CM | POA: Diagnosis not present

## 2017-08-18 DIAGNOSIS — F418 Other specified anxiety disorders: Secondary | ICD-10-CM

## 2017-08-18 DIAGNOSIS — Z760 Encounter for issue of repeat prescription: Secondary | ICD-10-CM | POA: Diagnosis not present

## 2017-08-18 DIAGNOSIS — Z79899 Other long term (current) drug therapy: Secondary | ICD-10-CM | POA: Insufficient documentation

## 2017-08-18 DIAGNOSIS — Z23 Encounter for immunization: Secondary | ICD-10-CM | POA: Diagnosis not present

## 2017-08-18 DIAGNOSIS — M898X9 Other specified disorders of bone, unspecified site: Secondary | ICD-10-CM | POA: Diagnosis not present

## 2017-08-18 MED ORDER — HYDROCHLOROTHIAZIDE 25 MG PO TABS: 25.0000 mg | ORAL_TABLET | Freq: Every day | ORAL | 2 refills | Status: DC

## 2017-08-18 MED ORDER — BUPROPION HCL ER (XL) 300 MG PO TB24: 300.0000 mg | ORAL_TABLET | Freq: Every day | ORAL | 2 refills | Status: DC

## 2017-08-18 MED ORDER — LISINOPRIL 40 MG PO TABS: 40.0000 mg | ORAL_TABLET | Freq: Every day | ORAL | 2 refills | Status: DC

## 2017-08-18 MED ORDER — ACETAMINOPHEN 500 MG PO TABS: 500.0000 mg | ORAL_TABLET | Freq: Four times a day (QID) | ORAL | 1 refills | Status: AC | PRN

## 2017-08-18 MED ORDER — CITALOPRAM HYDROBROMIDE 40 MG PO TABS: 40.0000 mg | ORAL_TABLET | Freq: Every day | ORAL | 2 refills | Status: DC

## 2017-08-18 MED FILL — HYDROCHLOROTHIAZIDE 25 MG T: 25 | 30 days supply | Qty: 30 | Fill #0

## 2017-08-18 MED FILL — BUPROPION HCL XL 300 MG TAB: 300 | 30 days supply | Qty: 30 | Fill #0

## 2017-08-18 MED FILL — LISINOPRIL 40 MG TAB: 40 | 30 days supply | Qty: 30 | Fill #0

## 2017-08-18 NOTE — Patient Instructions (Signed)
Managing Your Hypertension Hypertension is commonly called high blood pressure. This is when the force of your blood pressing against the walls of your arteries is too strong. Arteries are blood vessels that carry blood from your heart throughout your body. Hypertension forces the heart to work harder to pump blood, and may cause the arteries to become narrow or stiff. Having untreated or uncontrolled hypertension can cause heart attack, stroke, kidney disease, and other problems. What are blood pressure readings? A blood pressure reading consists of a higher number over a lower number. Ideally, your blood pressure should be below 120/80. The first ("top") number is called the systolic pressure. It is a measure of the pressure in your arteries as your heart beats. The second ("bottom") number is called the diastolic pressure. It is a measure of the pressure in your arteries as the heart relaxes. What does my blood pressure reading mean? Blood pressure is classified into four stages. Based on your blood pressure reading, your health care provider may use the following stages to determine what type of treatment you need, if any. Systolic pressure and diastolic pressure are measured in a unit called mm Hg. Normal  Systolic pressure: below 120.  Diastolic pressure: below 80. Elevated  Systolic pressure: 120-129.  Diastolic pressure: below 80. Hypertension stage 1  Systolic pressure: 130-139.  Diastolic pressure: 80-89. Hypertension stage 2  Systolic pressure: 140 or above.  Diastolic pressure: 90 or above. What health risks are associated with hypertension? Managing your hypertension is an important responsibility. Uncontrolled hypertension can lead to:  A heart attack.  A stroke.  A weakened blood vessel (aneurysm).  Heart failure.  Kidney damage.  Eye damage.  Metabolic syndrome.  Memory and concentration problems.  What changes can I make to manage my  hypertension? Hypertension can be managed by making lifestyle changes and possibly by taking medicines. Your health care provider will help you make a plan to bring your blood pressure within a normal range. Eating and drinking  Eat a diet that is high in fiber and potassium, and low in salt (sodium), added sugar, and fat. An example eating plan is called the DASH (Dietary Approaches to Stop Hypertension) diet. To eat this way: ? Eat plenty of fresh fruits and vegetables. Try to fill half of your plate at each meal with fruits and vegetables. ? Eat whole grains, such as whole wheat pasta, brown rice, or whole grain bread. Fill about one quarter of your plate with whole grains. ? Eat low-fat diary products. ? Avoid fatty cuts of meat, processed or cured meats, and poultry with skin. Fill about one quarter of your plate with lean proteins such as fish, chicken without skin, beans, eggs, and tofu. ? Avoid premade and processed foods. These tend to be higher in sodium, added sugar, and fat.  Reduce your daily sodium intake. Most people with hypertension should eat less than 1,500 mg of sodium a day.  Limit alcohol intake to no more than 1 drink a day for nonpregnant women and 2 drinks a day for men. One drink equals 12 oz of beer, 5 oz of wine, or 1 oz of hard liquor. Lifestyle  Work with your health care provider to maintain a healthy body weight, or to lose weight. Ask what an ideal weight is for you.  Get at least 30 minutes of exercise that causes your heart to beat faster (aerobic exercise) most days of the week. Activities may include walking, swimming, or biking.  Include exercise   to strengthen your muscles (resistance exercise), such as weight lifting, as part of your weekly exercise routine. Try to do these types of exercises for 30 minutes at least 3 days a week.  Do not use any products that contain nicotine or tobacco, such as cigarettes and e-cigarettes. If you need help quitting, ask  your health care provider.  Control any long-term (chronic) conditions you have, such as high cholesterol or diabetes. Monitoring  Monitor your blood pressure at home as told by your health care provider. Your personal target blood pressure may vary depending on your medical conditions, your age, and other factors.  Have your blood pressure checked regularly, as often as told by your health care provider. Working with your health care provider  Review all the medicines you take with your health care provider because there may be side effects or interactions.  Talk with your health care provider about your diet, exercise habits, and other lifestyle factors that may be contributing to hypertension.  Visit your health care provider regularly. Your health care provider can help you create and adjust your plan for managing hypertension. Will I need medicine to control my blood pressure? Your health care provider may prescribe medicine if lifestyle changes are not enough to get your blood pressure under control, and if:  Your systolic blood pressure is 130 or higher.  Your diastolic blood pressure is 80 or higher.  Take medicines only as told by your health care provider. Follow the directions carefully. Blood pressure medicines must be taken as prescribed. The medicine does not work as well when you skip doses. Skipping doses also puts you at risk for problems. Contact a health care provider if:  You think you are having a reaction to medicines you have taken.  You have repeated (recurrent) headaches.  You feel dizzy.  You have swelling in your ankles.  You have trouble with your vision. Get help right away if:  You develop a severe headache or confusion.  You have unusual weakness or numbness, or you feel faint.  You have severe pain in your chest or abdomen.  You vomit repeatedly.  You have trouble breathing. Summary  Hypertension is when the force of blood pumping through  your arteries is too strong. If this condition is not controlled, it may put you at risk for serious complications.  Your personal target blood pressure may vary depending on your medical conditions, your age, and other factors. For most people, a normal blood pressure is less than 120/80.  Hypertension is managed by lifestyle changes, medicines, or both. Lifestyle changes include weight loss, eating a healthy, low-sodium diet, exercising more, and limiting alcohol. This information is not intended to replace advice given to you by your health care provider. Make sure you discuss any questions you have with your health care provider. Document Released: 03/21/2012 Document Revised: 05/25/2016 Document Reviewed: 05/25/2016 Elsevier Interactive Patient Education  2018 [employer]  

## 2017-08-18 NOTE — Progress Notes (Signed)
Subjective:  Patient ID: Kristen Chavez, female    DOB: [birth year]  Age: 67 y.o. MRN: [id number]  CC: Hypertension (has not taken BP meds in 3 weeks); Medication Refill; and Depression   HPI Shadiyah B Bhullar presents for presents for hypertension. She is not exercising and is not adherent to low salt diet.  She does not check BP at home. Cardiac symptoms none. Patient denies chest pain, chest pressure/discomfort, claudication, dyspnea, lower extremity edema, near-syncope, palpitations and syncope.  Cardiovascular risk factors: hypertension and sedentary lifestyle. Use of agents associated with hypertension: none. History of target organ damage: none. She reports being without her antihypertensive medications in 3 weeks. History of depression and anxiety. She reports symptoms are well controlled with current medications. She reports being without her medications for 3 wks. She denies any SI/HI. She declines speaking with LCSW at this time. Hand complaint: Onset 2 weeks ago. Right hand. Symptoms include tenderness to touch. She denies any history of injury, decreased ROM, or paresthesias .    Outpatient Medications Prior to Visit  Medication Sig Dispense Refill  . acetaminophen (TYLENOL) 500 MG tablet Take 500 mg by mouth every 6 (six) hours as needed for moderate pain or headache.     . Flavoring Agent (GRAPEFRUIT FLAVOR) OIL Apply 1 application topically daily as needed (knee pain). White grapefruit oil    . Lavender Oil OIL Apply 1 application topically 2 (two) times daily as needed (knee pain).    . hydrocortisone (ANUSOL-HC) 25 MG suppository   3  . Multiple Vitamin (MULTIVITAMIN WITH MINERALS) TABS tablet Take 1 tablet by mouth daily.    Marland Kitchen buPROPion (WELLBUTRIN XL) 300 MG 24 hr tablet Take 1 tablet (300 mg total) by mouth daily. (Patient not taking: Reported on 08/18/2017) 30 tablet 0  . citalopram (CELEXA) 40 MG tablet Take 1 tablet (40 mg total) by mouth daily. (Patient not taking: Reported on  08/18/2017) 30 tablet 0  . lisinopril-hydrochlorothiazide (PRINZIDE,ZESTORETIC) 20-25 MG tablet Take 1 tablet by mouth daily. (Patient not taking: Reported on 08/18/2017) 30 tablet 0   No facility-administered medications prior to visit.     ROS Review of Systems  Constitutional: Negative.   Respiratory: Negative.   Cardiovascular: Negative.   Musculoskeletal: Positive for arthralgias.  Skin: Negative.   Psychiatric/Behavioral: Negative for suicidal ideas.       History of anxiety/depression.    Objective:  BP (!) 178/118 (BP Location: Right Arm, Patient Position: Sitting, Cuff Size: Normal)   Pulse 81   Temp 98.1 F (36.7 C) (Oral)   Ht 5\' 5"  (1.651 m)   Wt 167 lb 12.8 oz (76.1 kg)   SpO2 97%   BMI 27.92 kg/m   BP/Weight 08/18/2017 02/10/2017 07/29/4172  Systolic BP 081 448 185  Diastolic BP 631 87 82  Wt. (Lbs) 167.8 158 160  BMI 27.92 26.29 26.63     Physical Exam  Constitutional: She appears well-developed and well-nourished.  Eyes: Conjunctivae are normal. Pupils are equal, round, and reactive to light.  Neck: No JVD present.  Cardiovascular: Normal rate, regular rhythm, normal heart sounds and intact distal pulses.  Pulmonary/Chest: Effort normal and breath sounds normal.  Abdominal: Soft. Bowel sounds are normal.  Musculoskeletal:       Right hand: She exhibits bony tenderness. She exhibits normal range of motion. Normal strength noted.       Left hand: She exhibits normal range of motion. Normal strength noted.       Hands: Skin: Skin  is warm and dry.  Nursing note and vitals reviewed.    Assessment & Plan:   1. Essential hypertension  - lisinopril (PRINIVIL,ZESTRIL) 40 MG tablet; Take 1 tablet (40 mg total) by mouth daily.  Dispense: 30 tablet; Refill: 2 - hydrochlorothiazide (HYDRODIURIL) 25 MG tablet; Take 1 tablet (25 mg total) by mouth daily.  Dispense: 30 tablet; Refill: 2  2. Medication refill  - buPROPion (WELLBUTRIN XL) 300 MG 24 hr tablet; Take 1  tablet (300 mg total) by mouth daily.  Dispense: 30 tablet; Refill: 2 - citalopram (CELEXA) 40 MG tablet; Take 1 tablet (40 mg total) by mouth daily.  Dispense: 30 tablet; Refill: 2  3. Depression with anxiety  - buPROPion (WELLBUTRIN XL) 300 MG 24 hr tablet; Take 1 tablet (300 mg total) by mouth daily.  Dispense: 30 tablet; Refill: 2 - citalopram (CELEXA) 40 MG tablet; Take 1 tablet (40 mg total) by mouth daily.  Dispense: 30 tablet; Refill: 2  4. Needs flu shot  - Flu Vaccine QUAD 36+ mos IM  5. Bony prominence   6. Hand pain, left Acute pain, NKI, If symptoms continue consider imaging. - acetaminophen (TYLENOL) 500 MG tablet; Take 1 tablet (500 mg total) by mouth every 6 (six) hours as needed for moderate pain or headache.  Dispense: 30 tablet; Refill: 1      Follow-up: Return in about 2 weeks (around 09/01/2017) for BP check with Marzetta Board or Carilyn Goodpasture.   Alfonse Spruce FNP

## 2017-10-03 MED FILL — BUPROPION HCL XL 300 MG TAB: 300 | 30 days supply | Qty: 30 | Fill #1

## 2017-10-03 MED FILL — HYDROCHLOROTHIAZIDE 25 MG T: 25 | 30 days supply | Qty: 30 | Fill #1

## 2017-10-03 MED FILL — LISINOPRIL 40 MG TAB: 40 | 30 days supply | Qty: 30 | Fill #1

## 2017-10-03 MED FILL — CITALOPRAM HBR 40 MG TABLET: 40 | 30 days supply | Qty: 30 | Fill #0

## 2017-10-18 DIAGNOSIS — Z23 Encounter for immunization: Secondary | ICD-10-CM | POA: Diagnosis not present

## 2017-10-28 ENCOUNTER — Encounter (HOSPITAL_COMMUNITY): Payer: Self-pay

## 2017-10-28 ENCOUNTER — Emergency Department (HOSPITAL_COMMUNITY): Payer: Medicare Other

## 2017-10-28 ENCOUNTER — Emergency Department (HOSPITAL_COMMUNITY)
Admission: EM | Admit: 2017-10-28 | Discharge: 2017-10-28 | Disposition: A | Discharge status: Home / Self Care | Payer: Medicare Other | Attending: Emergency Medicine | Admitting: Emergency Medicine

## 2017-10-28 ENCOUNTER — Other Ambulatory Visit: Payer: Self-pay

## 2017-10-28 DIAGNOSIS — Z87891 Personal history of nicotine dependence: Secondary | ICD-10-CM | POA: Diagnosis not present

## 2017-10-28 DIAGNOSIS — R404 Transient alteration of awareness: Secondary | ICD-10-CM | POA: Diagnosis not present

## 2017-10-28 DIAGNOSIS — E860 Dehydration: Secondary | ICD-10-CM

## 2017-10-28 DIAGNOSIS — B001 Herpesviral vesicular dermatitis: Secondary | ICD-10-CM

## 2017-10-28 DIAGNOSIS — F329 Major depressive disorder, single episode, unspecified: Secondary | ICD-10-CM | POA: Insufficient documentation

## 2017-10-28 DIAGNOSIS — I10 Essential (primary) hypertension: Secondary | ICD-10-CM | POA: Diagnosis not present

## 2017-10-28 DIAGNOSIS — F419 Anxiety disorder, unspecified: Secondary | ICD-10-CM | POA: Insufficient documentation

## 2017-10-28 DIAGNOSIS — R42 Dizziness and giddiness: Secondary | ICD-10-CM | POA: Diagnosis not present

## 2017-10-28 DIAGNOSIS — Z79899 Other long term (current) drug therapy: Secondary | ICD-10-CM | POA: Insufficient documentation

## 2017-10-28 LAB — HEPATIC FUNCTION PANEL
ALK PHOS: 73 U/L (ref 38–126)
ALT: 26 U/L (ref 14–54)
AST: 25 U/L (ref 15–41)
Albumin: 3.9 g/dL (ref 3.5–5.0)
BILIRUBIN TOTAL: 0.5 mg/dL (ref 0.3–1.2)
Total Protein: 7.4 g/dL (ref 6.5–8.1)

## 2017-10-28 LAB — BASIC METABOLIC PANEL
Anion gap: 11 (ref 5–15)
BUN: 15 mg/dL (ref 6–20)
CALCIUM: 9.7 mg/dL (ref 8.9–10.3)
CHLORIDE: 103 mmol/L (ref 101–111)
CO2: 22 mmol/L (ref 22–32)
CREATININE: 1.31 mg/dL — AB (ref 0.44–1.00)
GFR calc non Af Amer: 41 mL/min — ABNORMAL LOW (ref >60)
GFR, EST AFRICAN AMERICAN: 47 mL/min — AB (ref >60)
Glucose, Bld: 108 mg/dL — ABNORMAL HIGH (ref 65–99)
Potassium: 3.6 mmol/L (ref 3.5–5.1)
SODIUM: 136 mmol/L (ref 135–145)

## 2017-10-28 LAB — CBC
HCT: 38.6 % (ref 36.0–46.0)
Hemoglobin: 13 g/dL (ref 12.0–15.0)
MCH: 26.5 pg (ref 26.0–34.0)
MCHC: 33.7 g/dL (ref 30.0–36.0)
MCV: 78.8 fL (ref 78.0–100.0)
PLATELETS: 299 10*3/uL (ref 150–400)
RBC: 4.9 MIL/uL (ref 3.87–5.11)
RDW: 15.3 % (ref 11.5–15.5)
WBC: 5.8 10*3/uL (ref 4.0–10.5)

## 2017-10-28 LAB — URINALYSIS, ROUTINE W REFLEX MICROSCOPIC
BILIRUBIN URINE: NEGATIVE
Glucose, UA: NEGATIVE mg/dL
HGB URINE DIPSTICK: NEGATIVE
Ketones, ur: 5 mg/dL — AB
Nitrite: NEGATIVE
PH: 6 (ref 5.0–8.0)
Protein, ur: NEGATIVE mg/dL
SPECIFIC GRAVITY, URINE: 1.02 (ref 1.005–1.030)

## 2017-10-28 LAB — MAGNESIUM: MAGNESIUM: 1.8 mg/dL (ref 1.7–2.4)

## 2017-10-28 MED ORDER — ACYCLOVIR 400 MG PO TABS: 400.0000 mg | ORAL_TABLET | Freq: Three times a day (TID) | ORAL | 0 refills | Status: AC

## 2017-10-28 MED ORDER — ACYCLOVIR 200 MG PO CAPS
400.0000 mg | ORAL_CAPSULE | Freq: Once | ORAL | Status: AC
  Administered 2017-10-28: 400 mg via ORAL
  Filled 2017-10-28: qty 2

## 2017-10-28 MED ORDER — SODIUM CHLORIDE 0.9 % IV BOLUS
1000.0000 mL | Freq: Once | INTRAVENOUS | Status: AC
  Administered 2017-10-28: 1000 mL via INTRAVENOUS

## 2017-10-28 NOTE — ED Notes (Signed)
Pt stable, ambulatory, states understanding of discharge instructions 

## 2017-10-28 NOTE — Discharge Instructions (Addendum)
Your work-up today was overall reassuring.  We suspect you are slightly dehydrated.  Since your blood pressure improved after fluids, we do not feel he needs admission.  We did find evidence of a cold sore.  Please use the antiviral medication to help with this.  Please follow-up with your primary doctor next week for reassessment of your kidney function and for further monitoring.  If any symptoms change or worsen, please return to the nearest emergency department.

## 2017-10-28 NOTE — ED Notes (Signed)
Patient transported to X-ray 

## 2017-10-28 NOTE — ED Triage Notes (Signed)
Pt arrived from home with c/o dizziness while cleaning today. Pt reports sitting when she felt dizzy. Pt was not orthostatic for EMS but did become dizzy again with ambulation and was tachypenic with numbness and tingling in both hands and feet. She reports the tingling comes and goes.

## 2017-10-28 NOTE — ED Provider Notes (Signed)
[facility] Provider Note   CSN: [id number] Arrival date & time: 10/28/17  1603     History   Chief Complaint Chief Complaint  Patient presents with  . Dizziness    HPI Kristen Chavez is a 67 y.o. female.  The history is provided by the patient and medical records. No language interpreter was used.  Dizziness  Quality:  Lightheadedness Severity:  Moderate Onset quality:  Gradual Duration:  3 hours Timing:  Constant Progression:  Waxing and waning Chronicity:  Recurrent Relieved by:  Lying down Worsened by:  Standing up Ineffective treatments:  Lying down Associated symptoms: palpitations   Associated symptoms: no chest pain, no diarrhea, no headaches, no nausea, no shortness of breath, no syncope, no tinnitus, no vision changes, no vomiting and no weakness     Past Medical History:  Diagnosis Date  . Anxiety   . Arthritis    osteoarthritis.  . Depression   . Hypertension     Patient Active Problem List   Diagnosis Date Noted  . Smoking 08/26/2013  . ANXIETY 11/25/2007  . DEPRESSION 11/25/2007  . Essential hypertension 11/25/2007  . Osteoarthritis 11/25/2007  . 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 TONSILLECTOMY       OB History   None      Home Medications    Prior to Admission medications   Medication Sig Start Date End Date Taking? Authorizing Provider  acetaminophen (TYLENOL) 500 MG tablet Take 1 tablet (500 mg total) by mouth every 6 (six) hours as needed for moderate pain or headache. 08/18/17   Alfonse Spruce, FNP  buPROPion (WELLBUTRIN XL) 300 MG 24 hr tablet Take 1 tablet (300 mg total) by mouth daily. 08/18/17   Alfonse Spruce, FNP  citalopram (CELEXA) 40 MG tablet Take 1 tablet (40 mg total) by mouth daily. 08/18/17   Alfonse Spruce, FNP  Flavoring Agent (GRAPEFRUIT FLAVOR) OIL Apply 1 application topically daily  as needed (knee pain). White grapefruit oil    [provider]  hydrochlorothiazide (HYDRODIURIL) 25 MG tablet Take 1 tablet (25 mg total) by mouth daily. 08/18/17   Alfonse Spruce, FNP  hydrocortisone (ANUSOL-HC) 25 MG suppository  06/06/16   [provider]  Radene Gunning OIL Apply 1 application topically 2 (two) times daily as needed (knee pain).    [provider]  lisinopril (PRINIVIL,ZESTRIL) 40 MG tablet Take 1 tablet (40 mg total) by mouth daily. 08/18/17   Alfonse Spruce, FNP  Multiple Vitamin (MULTIVITAMIN WITH MINERALS) TABS tablet Take 1 tablet by mouth daily.    [provider]    Family History Family History  Problem Relation Age of Onset  . Hypertension Mother   . Rheum arthritis Mother   . Other Father        suicide  . Diabetes Paternal Grandmother   . Diabetes Paternal Grandfather     Social History Social History   Tobacco Use  . Smoking status: Former Smoker    Packs/day: 0.25    Types: Cigarettes    Last attempt to quit: 04/14/2014    Years since quitting: 3.5  . Smokeless tobacco: Never Used  Substance Use Topics  . Alcohol use: Yes    Alcohol/week: 12.6 oz    Types: 21 Glasses of wine per week    Comment: socially wine  . Drug use: Yes    Types: Marijuana  Allergies   Patient has no known allergies.   Review of Systems Review of Systems  Constitutional: Negative for chills, diaphoresis, fatigue and fever.  HENT: Negative for congestion and tinnitus.   Eyes: Negative for visual disturbance.  Respiratory: Negative for cough, chest tightness and shortness of breath.   Cardiovascular: Positive for palpitations. Negative for chest pain, leg swelling and syncope.  Gastrointestinal: Negative for abdominal pain, constipation, diarrhea, nausea and vomiting.  Genitourinary: Negative for dysuria, flank pain and frequency.  Musculoskeletal: Negative for back pain, neck pain and neck stiffness.  Neurological:  Positive for light-headedness. Negative for dizziness, tremors, seizures, facial asymmetry, weakness, numbness and headaches.  Psychiatric/Behavioral: Negative for agitation and confusion.  All other systems reviewed and are negative.    Physical Exam Updated Vital Signs BP 110/82   Pulse 75   Temp 98.7 F (37.1 C) (Oral)   Resp 16   Ht 5\' 5"  (1.651 m)   Wt 70.3 kg (155 lb)   SpO2 98%   BMI 25.79 kg/m   Physical Exam  Constitutional: She is oriented to person, place, and time. She appears well-developed and well-nourished. No distress.  HENT:  Head: Atraumatic.    Mouth/Throat: Oropharynx is clear and moist. No oropharyngeal exudate.  Eyes: Pupils are equal, round, and reactive to light. Conjunctivae and EOM are normal.  Neck: Normal range of motion. Neck supple.  Cardiovascular: Normal rate and regular rhythm.  No murmur heard. Pulmonary/Chest: Effort normal and breath sounds normal. No stridor. No respiratory distress. She has no wheezes. She exhibits no tenderness.  Abdominal: Soft. There is no tenderness. There is no guarding.  Musculoskeletal: She exhibits no edema or tenderness.  Neurological: She is alert and oriented to person, place, and time. No cranial nerve deficit or sensory deficit. She exhibits normal muscle tone. Coordination normal.  Skin: Skin is warm and dry. Capillary refill takes less than 2 seconds. No rash noted. She is not diaphoretic. No erythema.  Psychiatric: She has a normal mood and affect.  Nursing note and vitals reviewed.    ED Treatments / Results  Labs (all labs ordered are listed, but only abnormal results are displayed) Labs Reviewed  BASIC METABOLIC PANEL - Abnormal; Notable for the following components:      Result Value   Glucose, Bld 108 (*)    Creatinine, Ser 1.31 (*)    GFR calc non Af Amer 41 (*)    GFR calc Af Amer 47 (*)    All other components within normal limits  URINALYSIS, ROUTINE W REFLEX MICROSCOPIC - Abnormal;  Notable for the following components:   APPearance HAZY (*)    Ketones, ur 5 (*)    Leukocytes, UA SMALL (*)    Bacteria, UA RARE (*)    Squamous Epithelial / LPF 6-30 (*)    All other components within normal limits  HEPATIC FUNCTION PANEL - Abnormal; Notable for the following components:   Bilirubin, Direct <0.1 (*)    All other components within normal limits  URINE CULTURE  CBC  MAGNESIUM  CBG MONITORING, ED    EKG EKG Interpretation  Date/Time:  Saturday October 28 2017 16:14:54 EDT Ventricular Rate:  108 PR Interval:  138 QRS Duration: 76 QT Interval:  346 QTC Calculation: 463 R Axis:   -17 Text Interpretation:  Sinus tachycardia Anterior infarct , age undetermined Abnormal ECG When compared to prior, faster rate.  No STEMI Confirmed by Antony Blackbird [phone]) on 10/28/2017 4:45:08 PM   Radiology Dg Chest  2 View  Result Date: 10/28/2017 CLINICAL DATA:  Hypotension, dizziness, near syncope EXAM: CHEST - 2 VIEW COMPARISON:  07/19/2013 FINDINGS: Heart and mediastinal contours are within normal limits. No focal opacities or effusions. No acute bony abnormality. IMPRESSION: No active cardiopulmonary disease. Electronically Signed   By: Rolm Baptise M.D.   On: 10/28/2017 18:06    Procedures Procedures (including critical care time)  Medications Ordered in ED Medications  sodium chloride 0.9 % bolus 1,000 mL (0 mLs Intravenous Stopped 10/28/17 1956)  sodium chloride 0.9 % bolus 1,000 mL (0 mLs Intravenous Stopped 10/28/17 1956)  acyclovir (ZOVIRAX) 200 MG capsule 400 mg (400 mg Oral Given 10/28/17 2219)     Initial Impression / Assessment and Plan / ED Course  I have reviewed the triage vital signs and the nursing notes.  Pertinent labs & imaging results that were available during my care of the patient were reviewed by me and considered in my medical decision making (see chart for details).     TERRIA DESCHEPPER is a 67 y.o. female with a past medical history significant  for hypertension, anxiety, and depression who presents with lightheadedness and tingling.  Patient reports that she has had tingling in her upper and lower extremity's for years.  She says that she has had lightheadedness in the past but she feels that today it worsened.  She reports that she was mopping and cleaning at home when she felt that she is a down from being lightheaded.  She denied any vertigo or room spinning dizziness sensation.  She denied any vision changes or headaches.  She denied any nausea or vomiting.  She denies hitting her head or syncopized but laid down and started feeling better.  She denies any conservation, diarrhea, urinary symptoms.  She denies any cough, congestion, shortness of breath or chest pain.  She does report some palpitations and heart racing sensation when she felt lightheaded.  She denies any other complaints today.  She reports that her tingling comes and goes but does not seem to be related to her symptoms.  Patient says that she thinks she is staying hydrated but she is unsure.  On exam, patient's initial blood pressure was 97 and 95 systolic when repeated.  Suspect patient is having some lightheadedness from dehydration.  Although patient denied any cough or urinary symptoms, will look for occult infection given the soft blood pressures.  Patient will also have electrolyte and kidney function assessed given the tingling sensation.  Patient will be rehydrated with fluids and reassessed.    Patient's exam showed no focal neurologic deficits.  Normal grip strength and normal leg strength.  Normal sensation throughout and patient did not have tingling on my exam.  No facial droop and no speech ab normalities.  Lungs clear and chest nontender.  Abdomen was nontender.  Patient appears well.  Anticipate reassessment after rehydration and diagnostic laboratory testing to rule out infections.  If patient's blood pressure has improved and she is no longer feel  lightheaded, anticipate she will be safe for discharge home in the setting of likely dehydration orthostatic near syncope.   Workup showed mild AKI. PT will follow up with PCP for crt recheck.   Pt felt better on reassessment. No more lightlessness. PT give dose of acyclovir for the cold sore. PT given prescription for acyclovir. PT will follow up with PCP and understood return precautions.     Final Clinical Impressions(s) / ED Diagnoses   Final diagnoses:  Lightheaded  Dehydration  Cold sore    ED Discharge Orders        Ordered    acyclovir (ZOVIRAX) 400 MG tablet  3 times daily     10/28/17 2156      Clinical Impression: 1. Lightheaded   2. Dehydration   3. Cold sore     Disposition: Discharge  Condition: Good  I have discussed the results, Dx and Tx plan with the pt(& family if present). He/she/they expressed understanding and agree(s) with the plan. Discharge instructions discussed at great length. Strict return precautions discussed and pt &/or family have verbalized understanding of the instructions. No further questions at time of discharge.    Discharge Medication List as of 10/28/2017  9:58 PM    START taking these medications   Details  acyclovir (ZOVIRAX) 400 MG tablet Take 1 tablet (400 mg total) by mouth 3 (three) times daily for 10 days., Starting Sat 10/28/2017, Until Tue 11/07/2017, Print        Follow Up: No follow-up provider specified.     Page Lancon, Gwenyth Allegra, MD 10/29/17 0010

## 2017-10-31 LAB — URINE CULTURE

## 2017-11-01 ENCOUNTER — Telehealth: Payer: Self-pay | Admitting: Emergency Medicine

## 2017-11-01 NOTE — Progress Notes (Signed)
ED Antimicrobial Stewardship Positive Culture Follow Up   Kristen Chavez is an 67 y.o. female who presented to [facility] on 10/28/2017 with a chief complaint of  Chief Complaint  Patient presents with  . Dizziness    Recent Results (from the past 720 hour(s))  Urine culture     Status: Abnormal   Collection Time: 10/28/17  5:07 PM  Result Value Ref Range Status   Specimen Description URINE, RANDOM  Final   Special Requests   Final    NONE Performed at [facility], [address], [place], [place] [address]    Culture >=100,000 COLONIES/mL ENTEROCOCCUS FAECALIS (A)  Final   Report Status 10/31/2017 FINAL  Final   Organism ID, Bacteria ENTEROCOCCUS FAECALIS (A)  Final      Susceptibility   Enterococcus faecalis - MIC*    AMPICILLIN <=2 SENSITIVE Sensitive     LEVOFLOXACIN 1 SENSITIVE Sensitive     NITROFURANTOIN <=16 SENSITIVE Sensitive     VANCOMYCIN 1 SENSITIVE Sensitive     * >=100,000 COLONIES/mL ENTEROCOCCUS FAECALIS    Pt symptoms resolved with rehydration in the ED. No reports of urinary symptoms or flank pain. Plan for no antibiotic therapy.   ED Provider: Fransico Meadow PA-C   Karmen Stabs, PharmD Candidate 11/01/2017, 9:41 AM Phone# [phone]

## 2017-11-01 NOTE — Telephone Encounter (Signed)
Post ED Visit - Positive Culture Follow-up  Culture report reviewed by antimicrobial stewardship pharmacist:  []  Elenor Quinones, Pharm.D. []  Heide Guile, Pharm.D., BCPS AQ-ID []  Parks Neptune, Pharm.D., BCPS []  Alycia Rossetti, Pharm.D., BCPS []  Be[place]ont, Pharm.D., BCPS, AAHIVP []  Legrand Como, Pharm.D., BCPS, AAHIVP []  Salome Arnt, PharmD, BCPS []  Jalene Mullet, PharmD []  Vincenza Hews, PharmD, BCPS Jimmy Footman PharmD  Positive urine culture Treated with none, asymptomatic, organism sensitive to the same and no further patient follow-up is required at this time.  Hazle Nordmann 11/01/2017, 2:04 PM

## 2017-11-27 MED FILL — LISINOPRIL 40 MG TABLET: 40 | 30 days supply | Qty: 30 | Fill #2

## 2017-11-27 MED FILL — CITALOPRAM HBR 40 MG TABLET: 40 | 30 days supply | Qty: 30 | Fill #0

## 2017-11-27 MED FILL — HYDROCHLOROTHIAZIDE 25 MG T: 25 | 30 days supply | Qty: 30 | Fill #2

## 2017-11-27 MED FILL — BUPROPION HCL XL 300 MG TAB: 300 | 30 days supply | Qty: 30 | Fill #2

## 2017-12-11 ENCOUNTER — Ambulatory Visit: Payer: Medicare Other | Attending: Internal Medicine | Admitting: Internal Medicine

## 2017-12-11 ENCOUNTER — Encounter: Payer: Self-pay | Admitting: Internal Medicine

## 2017-12-11 VITALS — BP 122/83 | HR 74 | Temp 98.1°F | Resp 16 | Ht 65.0 in | Wt 143.8 lb

## 2017-12-11 DIAGNOSIS — M79644 Pain in right finger(s): Secondary | ICD-10-CM | POA: Diagnosis not present

## 2017-12-11 DIAGNOSIS — L84 Corns and callosities: Secondary | ICD-10-CM | POA: Diagnosis not present

## 2017-12-11 DIAGNOSIS — N289 Disorder of kidney and ureter, unspecified: Secondary | ICD-10-CM | POA: Insufficient documentation

## 2017-12-11 DIAGNOSIS — Z79899 Other long term (current) drug therapy: Secondary | ICD-10-CM | POA: Insufficient documentation

## 2017-12-11 DIAGNOSIS — M170 Bilateral primary osteoarthritis of knee: Secondary | ICD-10-CM | POA: Insufficient documentation

## 2017-12-11 DIAGNOSIS — R634 Abnormal weight loss: Secondary | ICD-10-CM | POA: Insufficient documentation

## 2017-12-11 DIAGNOSIS — Z114 Encounter for screening for human immunodeficiency virus [HIV]: Secondary | ICD-10-CM | POA: Diagnosis not present

## 2017-12-11 DIAGNOSIS — F419 Anxiety disorder, unspecified: Secondary | ICD-10-CM | POA: Insufficient documentation

## 2017-12-11 DIAGNOSIS — I10 Essential (primary) hypertension: Secondary | ICD-10-CM | POA: Diagnosis present

## 2017-12-11 DIAGNOSIS — Z87891 Personal history of nicotine dependence: Secondary | ICD-10-CM | POA: Diagnosis not present

## 2017-12-11 DIAGNOSIS — M1990 Unspecified osteoarthritis, unspecified site: Secondary | ICD-10-CM | POA: Diagnosis not present

## 2017-12-11 DIAGNOSIS — F329 Major depressive disorder, single episode, unspecified: Secondary | ICD-10-CM | POA: Insufficient documentation

## 2017-12-11 DIAGNOSIS — F32A Depression, unspecified: Secondary | ICD-10-CM

## 2017-12-11 DIAGNOSIS — D229 Melanocytic nevi, unspecified: Secondary | ICD-10-CM | POA: Insufficient documentation

## 2017-12-11 DIAGNOSIS — F1290 Cannabis use, unspecified, uncomplicated: Secondary | ICD-10-CM | POA: Diagnosis not present

## 2017-12-11 NOTE — Progress Notes (Signed)
Patient ID: Kristen Chavez, female    DOB: 11-09-1950  MRN: [id number]  CC: re-establish and Hypertension   Subjective: Kristen Chavez is a 67 y.o. female who presents for chronic ds management.  Last saw [facility] 08/2017. Her concerns today include:  Pt with hx of HTN, dep/anxiety  C/o having hyperpigmented spot on face below RT nostril x 1.5 mths. It was a pimple that she popped.  It has increased in size.  Also spot on RT forearm x 2 mths that was infected.  Used essential oils on it.  C/o pain around base around the RT thumb x few mths.  Swollen and painful.  No injuries.  Seen in the emergency room in April of this year with some lightheadedness.  Found to have mild renal insufficiency questionable dehydration.  She was advised to have her kidneys rechecked with her PCP.  She has been drinking adequate amounts of water during the day since then.     Loss 11 lbs unintentionally since 10/28/2017.  Active working in a community garden. Walks about twice a wk.  Poor appetite over the past 2 mths.  Not sure why. Feels anx/dep well control when "I take my meds."   Misses dose a few times a wk but she has been taking consistently over past few wks No problems swallowing; moving bowels okay.  No chronic cough.  Quit smoking 2 yrs ago.  No fever.  Occasional nightsweats Reports having a colonoscopy 2 yrs ago that was negative.  I do not have copy of this on the record.  She states she was given a copy of the report but thinks she misplaced it.  She does not recall the practice that did it.  Request referral to podiatry.  Callous and corns on heels and toes.    Drinks few glasses of wine about 4 times a week.  Smokes marijuana but does not care to elaborate on how often.  Denies any other street drug use.  Patient Active Problem List   Diagnosis Date Noted  . Smoking 08/26/2013  . ANXIETY 11/25/2007  . DEPRESSION 11/25/2007  . Essential hypertension 11/25/2007  . Osteoarthritis 11/25/2007    . 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 acetaminophen (TYLENOL) 500 MG tablet Take 1 tablet (500 mg total) by mouth every 6 (six) hours as needed for moderate pain or headache. 30 tablet 1  . buPROPion (WELLBUTRIN XL) 300 MG 24 hr tablet Take 1 tablet (300 mg total) by mouth daily. 30 tablet 2  . citalopram (CELEXA) 40 MG tablet Take 1 tablet (40 mg total) by mouth daily. 30 tablet 2  . Flavoring Agent (GRAPEFRUIT FLAVOR) OIL Apply 1 application topically daily as needed (knee pain). White grapefruit oil    . hydrochlorothiazide (HYDRODIURIL) 25 MG tablet Take 1 tablet (25 mg total) by mouth daily. 30 tablet 2  . Lavender Oil OIL Apply 1 application topically 2 (two) times daily as needed (knee pain).    Marland Kitchen lisinopril (PRINIVIL,ZESTRIL) 40 MG tablet Take 1 tablet (40 mg total) by mouth daily. 30 tablet 2  . Multiple Vitamin (MULTIVITAMIN WITH MINERALS) TABS tablet Take 1 tablet by mouth daily.     No current facility-administered medications on file prior to visit.     No Known Allergies  Social History   Socioeconomic History  . Marital status: Single    Spouse name: Not  on file  . Number of children: Not on file  . Years of education: Not on file  . Highest education level: Not on file  Occupational History  . Not on file  Social Needs  . Financial resource strain: Not on file  . Food insecurity:    Worry: Not on file    Inability: Not on file  . Transportation needs:    Medical: Not on file    Non-medical: Not on file  Tobacco Use  . Smoking status: Former Smoker    Packs/day: 0.25    Types: Cigarettes    Last attempt to quit: 04/14/2014    Years since quitting: 3.6  . Smokeless tobacco: Never Used  Substance and Sexual Activity  . Alcohol use: Yes    Alcohol/week: 12.6 oz    Types: 21 Glasses of wine per week    Comment: socially wine  . Drug use: Yes     Types: Marijuana  . Sexual activity: Never  Lifestyle  . Physical activity:    Days per week: Not on file    Minutes per session: Not on file  . Stress: Not on file  Relationships  . Social connections:    Talks on phone: Not on file    Gets together: Not on file    Attends religious service: Not on file    Active member of club or organization: Not on file    Attends meetings of clubs or organizations: Not on file    Relationship status: Not on file  . Intimate partner violence:    Fear of current or ex partner: Not on file    Emotionally abused: Not on file    Physically abused: Not on file    Forced sexual activity: Not on file  Other Topics Concern  . Not on file  Social History Narrative  . Not on file    Family History  Problem Relation Age of Onset  . Hypertension Mother   . Rheum arthritis Mother   . Other Father        suicide  . Diabetes Paternal Grandmother   . Diabetes Paternal Grandfather     Past Surgical History:  Procedure Laterality Date  . TONSILLECTOMY      ROS: Review of Systems Negative except as stated above  PHYSICAL EXAM: BP 122/83   Pulse 74   Temp 98.1 F (36.7 C) (Oral)   Resp 16   Ht 5\' 5"  (1.651 m)   Wt 143 lb 12.8 oz (65.2 kg)   SpO2 99%   BMI 23.93 kg/m   Wt Readings from Last 3 Encounters:  12/11/17 143 lb 12.8 oz (65.2 kg)  10/28/17 155 lb (70.3 kg)  08/18/17 167 lb 12.8 oz (76.1 kg)    Physical Exam  General appearance - alert, well appearing, and in no distress Mental status - alert, oriented to person, place, and time, normal mood, behavior, speech, dress, motor activity, and thought processes Neck - supple, no significant adenopathy or thyroid enlargement LN: No cervical axillary or inguinal lymphadenopathy Chest - clear to auscultation, no wheezes, rales or rhonchi, symmetric air entry Heart - normal rate, regular rhythm, normal S1, S2, no murmurs, rubs, clicks or gallops Musculoskeletal -mild hypertrophy and  squaring of the cmt joint of the right thumb Extremities - peripheral pulses normal, no pedal edema, no clubbing or cyanosis Skin -flat hyperpigmented 2 cm area under the right nostril.  Mild hyperpigmented scarring on the right forearm of about 2 to  3 cm Feet: Corns on several of the toes most prominent one is on the left middle toe.  Callus on the heel of the left  Lab Results  Component Value Date   WBC 5.8 10/28/2017   HGB 13.0 10/28/2017   HCT 38.6 10/28/2017   MCV 78.8 10/28/2017   PLT 299 10/28/2017     Chemistry      Component Value Date/Time   NA 136 10/28/2017 1622   NA 143 02/10/2017 1202   K 3.6 10/28/2017 1622   CL 103 10/28/2017 1622   CO2 22 10/28/2017 1622   BUN 15 10/28/2017 1622   BUN 13 02/10/2017 1202   CREATININE 1.31 (H) 10/28/2017 1622   CREATININE 0.92 03/24/2016 1137      Component Value Date/Time   CALCIUM 9.7 10/28/2017 1622   ALKPHOS 73 10/28/2017 1741   AST 25 10/28/2017 1741   ALT 26 10/28/2017 1741   BILITOT 0.5 10/28/2017 1741   BILITOT 0.4 02/10/2017 1202      ASSESSMENT AND PLAN: 1. Corn of toe 2. Pre-ulcerative corn or callous - Ambulatory referral to Podiatry  3. Atypical mole - Ambulatory referral to Dermatology  4. Renal insufficiency Repeat renal function today.  5. Pain of right thumb Recommend over-the-counter Tylenol as needed.  6. Anxiety and depression Control on Celexa and bupropion  7. Weight loss, unintentional She is reportedly up-to-date with age-appropriate cancer screening however, I do not have documentation of the colonoscopy but patient states that she did indeed have one done 2 years ago that was negative.  I have asked her to start her documents at home to try to find a copy of that for our records. Recommend use of boost or Ensure shakes to supplement meals. - TSH  8. Screening for HIV (human immunodeficiency virus) - HIV antibody  9. Marijuana use Discouraged use  Patient was given the  opportunity to ask questions.  Patient verbalized understanding of the plan and was able to repeat key elements of the plan.   Orders Placed This Encounter  Procedures  . TSH  . HIV antibody  . Basic Metabolic Panel  . Ambulatory referral to Dermatology  . Ambulatory referral to Podiatry     Requested Prescriptions    No prescriptions requested or ordered in this encounter    No follow-ups on file.  Karle Plumber, MD, FACP

## 2017-12-11 NOTE — Patient Instructions (Addendum)
Try drinking Boost or Ensure shakes to supplement meals.  Please bring a copy of your colonoscopy report on next visit for our records.

## 2017-12-12 LAB — HIV ANTIBODY (ROUTINE TESTING W REFLEX): HIV SCREEN 4TH GENERATION: NONREACTIVE

## 2017-12-12 LAB — TSH: TSH: 0.64 u[IU]/mL (ref 0.450–4.500)

## 2017-12-14 LAB — SPECIMEN STATUS REPORT

## 2017-12-14 LAB — BASIC METABOLIC PANEL
BUN/Creatinine Ratio: 15 (ref 12–28)
BUN: 12 mg/dL (ref 8–27)
CALCIUM: 9.5 mg/dL (ref 8.7–10.3)
CO2: 20 mmol/L (ref 20–29)
Chloride: 102 mmol/L (ref 96–106)
Creatinine, Ser: 0.81 mg/dL (ref 0.57–1.00)
GFR, EST AFRICAN AMERICAN: 86 mL/min/{1.73_m2} (ref >59)
GFR, EST NON AFRICAN AMERICAN: 75 mL/min/{1.73_m2} (ref >59)
Glucose: 94 mg/dL (ref 65–99)
POTASSIUM: 4.1 mmol/L (ref 3.5–5.2)
Sodium: 140 mmol/L (ref 134–144)

## 2017-12-18 ENCOUNTER — Telehealth: Payer: Self-pay | Admitting: Internal Medicine

## 2017-12-18 NOTE — Telephone Encounter (Signed)
Patient called requesting her lab results. Patent was given results and had no questions.

## 2017-12-20 ENCOUNTER — Telehealth: Payer: Self-pay

## 2017-12-20 NOTE — Telephone Encounter (Signed)
Contacted pt to go over lab results pt is aware and doesn't have any questions or concerns 

## 2017-12-25 ENCOUNTER — Encounter: Payer: Medicare Other | Admitting: Podiatry

## 2017-12-27 DIAGNOSIS — Q142 Congenital malformation of optic disc: Secondary | ICD-10-CM | POA: Diagnosis not present

## 2017-12-27 DIAGNOSIS — H2511 Age-related nuclear cataract, right eye: Secondary | ICD-10-CM | POA: Diagnosis not present

## 2017-12-27 DIAGNOSIS — H25812 Combined forms of age-related cataract, left eye: Secondary | ICD-10-CM | POA: Diagnosis not present

## 2017-12-27 DIAGNOSIS — H11153 Pinguecula, bilateral: Secondary | ICD-10-CM | POA: Diagnosis not present

## 2017-12-27 DIAGNOSIS — H43393 Other vitreous opacities, bilateral: Secondary | ICD-10-CM | POA: Diagnosis not present

## 2018-01-01 NOTE — Progress Notes (Signed)
This encounter was created in error - please disregard.

## 2018-01-15 ENCOUNTER — Other Ambulatory Visit: Payer: Self-pay

## 2018-01-15 DIAGNOSIS — I10 Essential (primary) hypertension: Secondary | ICD-10-CM

## 2018-01-15 MED ORDER — LISINOPRIL 40 MG PO TABS: 40.0000 mg | ORAL_TABLET | Freq: Every day | ORAL | 6 refills | Status: DC

## 2018-01-15 MED ORDER — HYDROCHLOROTHIAZIDE 25 MG PO TABS: 25.0000 mg | ORAL_TABLET | Freq: Every day | ORAL | 6 refills | Status: DC

## 2018-01-15 MED FILL — BUPROPION HCL XL 300 MG TAB: 300 | 30 days supply | Qty: 30 | Fill #0

## 2018-01-15 MED FILL — CITALOPRAM HBR 40 MG TABLET: 40 | 30 days supply | Qty: 30 | Fill #1

## 2018-01-15 MED FILL — OFLOXACIN 0.3% EYE DROPS: 0.3 | 30 days supply | Qty: 10 | Fill #0

## 2018-01-15 MED FILL — PREDNISOLONE AC 1% EYE DROP: 1 | 36 days supply | Qty: 10 | Fill #0

## 2018-01-15 MED FILL — KETOROLAC 0.4% OPHTH SOLN: 0.4 | 18 days supply | Qty: 5 | Fill #0

## 2018-01-16 MED FILL — LISINOPRIL 40 MG TABLET: 40 | 30 days supply | Qty: 30 | Fill #0

## 2018-01-16 MED FILL — HYDROCHLOROTHIAZIDE 25 MG T: 25 | 30 days supply | Qty: 30 | Fill #0

## 2018-02-01 DIAGNOSIS — H268 Other specified cataract: Secondary | ICD-10-CM | POA: Diagnosis not present

## 2018-02-01 DIAGNOSIS — H2512 Age-related nuclear cataract, left eye: Secondary | ICD-10-CM | POA: Diagnosis not present

## 2018-02-19 ENCOUNTER — Ambulatory Visit: Payer: Medicare Other | Admitting: Internal Medicine

## 2018-03-01 ENCOUNTER — Other Ambulatory Visit: Payer: Self-pay

## 2018-03-01 DIAGNOSIS — Z760 Encounter for issue of repeat prescription: Secondary | ICD-10-CM

## 2018-03-01 DIAGNOSIS — F418 Other specified anxiety disorders: Secondary | ICD-10-CM

## 2018-03-01 MED ORDER — BUPROPION HCL ER (XL) 300 MG PO TB24: 300.0000 mg | ORAL_TABLET | Freq: Every day | ORAL | 2 refills | Status: DC

## 2018-03-01 MED FILL — HYDROCHLOROTHIAZIDE 25 MG T: 25 | 30 days supply | Qty: 30 | Fill #1

## 2018-03-01 MED FILL — CITALOPRAM HBR 40 MG TABLET: 40 | 30 days supply | Qty: 30 | Fill #2

## 2018-03-01 MED FILL — LISINOPRIL 40 MG TABLET: 40 | 30 days supply | Qty: 30 | Fill #1

## 2018-03-02 ENCOUNTER — Ambulatory Visit: Payer: Medicare Other | Admitting: Internal Medicine

## 2018-03-09 ENCOUNTER — Ambulatory Visit: Payer: Medicare Other | Admitting: Internal Medicine

## 2018-03-16 MED FILL — BUPROPION HCL XL 300 MG TAB: 300 | 30 days supply | Qty: 30 | Fill #0

## 2018-03-29 ENCOUNTER — Ambulatory Visit: Payer: Medicare Other

## 2018-04-20 MED FILL — PREDNISOLONE AC 1% EYE DROP: 1 | 36 days supply | Qty: 10 | Fill #1

## 2018-04-20 MED FILL — HYDROCHLOROTHIAZIDE 25 MG T: 25 | 30 days supply | Qty: 30 | Fill #2

## 2018-04-20 MED FILL — KETOROLAC 0.4% OPHTH SOLN: 0.4 | 18 days supply | Qty: 5 | Fill #1

## 2018-04-20 MED FILL — LISINOPRIL 40 MG TABLET: 40 | 30 days supply | Qty: 30 | Fill #2

## 2018-04-20 MED FILL — BUPROPION HCL XL 300 MG TAB: 300 | 30 days supply | Qty: 30 | Fill #1

## 2018-04-23 ENCOUNTER — Other Ambulatory Visit: Payer: Self-pay

## 2018-04-23 DIAGNOSIS — Z760 Encounter for issue of repeat prescription: Secondary | ICD-10-CM

## 2018-04-23 DIAGNOSIS — F418 Other specified anxiety disorders: Secondary | ICD-10-CM

## 2018-04-23 MED ORDER — CITALOPRAM HYDROBROMIDE 40 MG PO TABS: 40.0000 mg | ORAL_TABLET | Freq: Every day | ORAL | 2 refills | Status: DC

## 2018-04-23 MED FILL — CITALOPRAM HBR 40 MG TABLET: 40 | 30 days supply | Qty: 30 | Fill #0

## 2018-04-25 ENCOUNTER — Telehealth: Payer: Self-pay | Admitting: Internal Medicine

## 2018-04-25 NOTE — Telephone Encounter (Signed)
Pt  came one day early to her appointment, she requested to be seen on the same day and was offered the option to wait for a no show, an appointment was offered but she did not stay for that either. She stated she had to leave by 10:00am. Left the lobby before somebody could see her, I attempted to call her several times but no answer

## 2018-04-26 ENCOUNTER — Ambulatory Visit: Payer: Medicare Other

## 2018-04-27 ENCOUNTER — Ambulatory Visit: Payer: Medicare Other | Admitting: Internal Medicine

## 2018-06-11 MED FILL — HYDROCHLOROTHIAZIDE 25 MG T: 25 | 30 days supply | Qty: 30 | Fill #3

## 2018-06-11 MED FILL — LISINOPRIL 40 MG TABLET: 40 | 30 days supply | Qty: 30 | Fill #3

## 2018-06-11 MED FILL — CITALOPRAM HBR 40 MG TABLET: 40 | 30 days supply | Qty: 30 | Fill #1

## 2018-06-11 MED FILL — BUPROPION HCL XL 300 MG TAB: 300 | 30 days supply | Qty: 30 | Fill #2

## 2018-06-21 ENCOUNTER — Ambulatory Visit: Payer: Medicare Other | Admitting: Internal Medicine

## 2018-07-30 ENCOUNTER — Other Ambulatory Visit: Payer: Self-pay | Admitting: Internal Medicine

## 2018-07-30 DIAGNOSIS — Z760 Encounter for issue of repeat prescription: Secondary | ICD-10-CM

## 2018-07-30 DIAGNOSIS — F418 Other specified anxiety disorders: Secondary | ICD-10-CM

## 2018-07-30 MED FILL — CITALOPRAM HBR 40 MG TABLET: 40 | 30 days supply | Qty: 30 | Fill #2

## 2018-07-30 MED FILL — HYDROCHLOROTHIAZIDE 25 MG T: 25 | 30 days supply | Qty: 30 | Fill #4

## 2018-07-30 MED FILL — BUPROPION HCL XL 300 MG TAB: 300 | 30 days supply | Qty: 30 | Fill #0

## 2018-07-30 MED FILL — LISINOPRIL 40 MG TABLET: 40 | 30 days supply | Qty: 30 | Fill #4

## 2018-08-24 DIAGNOSIS — Z961 Presence of intraocular lens: Secondary | ICD-10-CM | POA: Diagnosis not present

## 2018-08-24 DIAGNOSIS — H2511 Age-related nuclear cataract, right eye: Secondary | ICD-10-CM | POA: Diagnosis not present

## 2018-09-10 ENCOUNTER — Other Ambulatory Visit: Payer: Self-pay | Admitting: Internal Medicine

## 2018-09-10 DIAGNOSIS — Z760 Encounter for issue of repeat prescription: Secondary | ICD-10-CM

## 2018-09-10 DIAGNOSIS — F418 Other specified anxiety disorders: Secondary | ICD-10-CM

## 2018-09-10 MED FILL — LISINOPRIL 40 MG TABLET: 40 | 30 days supply | Qty: 30 | Fill #5

## 2018-09-10 MED FILL — BUPROPION HCL XL 300 MG TAB: 300 | 30 days supply | Qty: 30 | Fill #1

## 2018-09-10 MED FILL — HYDROCHLOROTHIAZIDE 25 MG T: 25 | 30 days supply | Qty: 30 | Fill #5

## 2018-10-01 ENCOUNTER — Other Ambulatory Visit: Payer: Self-pay | Admitting: Internal Medicine

## 2018-10-01 DIAGNOSIS — Z760 Encounter for issue of repeat prescription: Secondary | ICD-10-CM

## 2018-10-01 DIAGNOSIS — F418 Other specified anxiety disorders: Secondary | ICD-10-CM

## 2018-10-01 DIAGNOSIS — I10 Essential (primary) hypertension: Secondary | ICD-10-CM

## 2018-10-01 MED ORDER — CITALOPRAM HYDROBROMIDE 40 MG PO TABS: 40.0000 mg | ORAL_TABLET | Freq: Every day | ORAL | 2 refills | Status: DC

## 2018-10-01 MED ORDER — LISINOPRIL 40 MG PO TABS: 40.0000 mg | ORAL_TABLET | Freq: Every day | ORAL | 1 refills | Status: DC

## 2018-10-01 MED ORDER — HYDROCHLOROTHIAZIDE 25 MG PO TABS: 25.0000 mg | ORAL_TABLET | Freq: Every day | ORAL | 1 refills | Status: DC

## 2018-10-01 NOTE — Telephone Encounter (Signed)
New Message  1) Medication(s) Requested (by name): buPROPion (WELLBUTRIN XL) 300 MG 24 hr tablet, citalopram (CELEXA) 40 MG tablet(pt states very important) , hydrochlorothiazide (HYDRODIURIL) 25 MG tablet and lisinopril (PRINIVIL,ZESTRIL) 40 MG tablet 2) Pharmacy of Choice:  3) Special Requests:   Approved medications will be sent to the pharmacy, we will reach out if there is an issue.  Requests made after 3pm may not be addressed until the following business day!  If a patient is unsure of the name of the medication(s) please note and ask patient to call back when they are able to provide all info, do not send to responsible party until all information is available!

## 2018-10-05 ENCOUNTER — Other Ambulatory Visit: Payer: Self-pay

## 2018-10-05 ENCOUNTER — Telehealth: Payer: Self-pay | Admitting: Internal Medicine

## 2018-10-05 DIAGNOSIS — F418 Other specified anxiety disorders: Secondary | ICD-10-CM

## 2018-10-05 DIAGNOSIS — Z760 Encounter for issue of repeat prescription: Secondary | ICD-10-CM

## 2018-10-05 MED ORDER — BUPROPION HCL ER (XL) 300 MG PO TB24: 300.0000 mg | ORAL_TABLET | Freq: Every day | ORAL | 1 refills | Status: DC

## 2018-10-05 MED FILL — BUPROPION HCL XL 300 MG TAB: 300 | 30 days supply | Qty: 30 | Fill #0

## 2018-10-05 NOTE — Telephone Encounter (Signed)
Returned pt call and made pt aware that rx's were sent on 10/01/2018. Made pt aware that her rx's will be mailed to her but someone from the pharmacy will contact her to confirm address and dob. Also informed pt that they will be taking payments over the phone.

## 2018-10-05 NOTE — Telephone Encounter (Signed)
Patient called because she says she  Cannot get through to the pharmacy. I explained the situation and everything going on however patient stated she is on edge and is beginning to feel the effects of her not taking the medicine. Patient stated she doesn't want to hurt anybody. Please follow up.

## 2018-10-09 MED FILL — HYDROCHLOROTHIAZIDE 25 MG T: 25 | 30 days supply | Qty: 30 | Fill #0 | Status: TO

## 2018-10-09 MED FILL — LISINOPRIL 40 MG TABLET: 40 | 30 days supply | Qty: 30 | Fill #0 | Status: TO

## 2018-10-09 MED FILL — CITALOPRAM HBR 40 MG TABLET: 40 | 30 days supply | Qty: 30 | Fill #0 | Status: TO

## 2018-11-09 ENCOUNTER — Encounter (INDEPENDENT_AMBULATORY_CARE_PROVIDER_SITE_OTHER): Payer: Self-pay | Admitting: Ophthalmology

## 2018-11-09 DIAGNOSIS — H33311 Horseshoe tear of retina without detachment, right eye: Secondary | ICD-10-CM | POA: Diagnosis not present

## 2018-11-09 DIAGNOSIS — H35372 Puckering of macula, left eye: Secondary | ICD-10-CM | POA: Diagnosis not present

## 2018-11-09 DIAGNOSIS — Z961 Presence of intraocular lens: Secondary | ICD-10-CM | POA: Diagnosis not present

## 2018-11-09 DIAGNOSIS — H2511 Age-related nuclear cataract, right eye: Secondary | ICD-10-CM | POA: Diagnosis not present

## 2018-11-09 MED FILL — PREDNISOLONE AC 1% EYE DROP: 1 | 20 days supply | Qty: 5 | Fill #0

## 2018-11-16 DIAGNOSIS — H30033 Focal chorioretinal inflammation, peripheral, bilateral: Secondary | ICD-10-CM | POA: Diagnosis not present

## 2018-11-21 MED FILL — CITALOPRAM HBR 40 MG TABLET: 40 | 30 days supply | Qty: 30 | Fill #0

## 2018-11-21 MED FILL — BUPROPION HCL XL 300 MG TAB: 300 | 30 days supply | Qty: 30 | Fill #1

## 2018-11-21 MED FILL — HYDROCHLOROTHIAZIDE 25 MG T: 25 | 30 days supply | Qty: 30 | Fill #0

## 2018-11-21 MED FILL — LISINOPRIL 40 MG TABLET: 40 | 30 days supply | Qty: 30 | Fill #0

## 2018-12-04 MED FILL — predniSONE 10 MG TABS: 10 | 30 days supply | Qty: 120 | Fill #0

## 2018-12-04 MED FILL — FOLIC ACID 1 MG TABS: 1 | 90 days supply | Qty: 90 | Fill #0

## 2018-12-05 MED FILL — METHOTREXATE SODIUM 2.5 MG: 2.5 | 28 days supply | Qty: 24 | Fill #0

## 2019-01-02 MED FILL — HYDROCHLOROTHIAZIDE 25 MG T: 25 | 30 days supply | Qty: 30 | Fill #6

## 2019-01-02 MED FILL — BUPROPION HCL XL 300 MG TAB: 300 | 30 days supply | Qty: 30 | Fill #2

## 2019-01-02 MED FILL — CITALOPRAM HBR 40 MG TABLET: 40 | 30 days supply | Qty: 30 | Fill #1

## 2019-01-02 MED FILL — LISINOPRIL 40 MG TAB: 40 | 30 days supply | Qty: 30 | Fill #6

## 2019-01-07 ENCOUNTER — Other Ambulatory Visit: Payer: Self-pay

## 2019-01-07 ENCOUNTER — Encounter: Payer: Self-pay | Admitting: Family Medicine

## 2019-01-07 ENCOUNTER — Ambulatory Visit: Payer: Self-pay | Attending: Family Medicine | Admitting: Family Medicine

## 2019-01-07 DIAGNOSIS — R1084 Generalized abdominal pain: Secondary | ICD-10-CM

## 2019-01-07 DIAGNOSIS — J018 Other acute sinusitis: Secondary | ICD-10-CM

## 2019-01-07 DIAGNOSIS — Z1211 Encounter for screening for malignant neoplasm of colon: Secondary | ICD-10-CM

## 2019-01-07 MED ORDER — FLUTICASONE PROPIONATE 50 MCG/ACT NA SUSP: 2.0000 | Freq: Every day | NASAL | 1 refills | Status: AC

## 2019-01-07 MED ORDER — CETIRIZINE HCL 10 MG PO TABS: 10.0000 mg | ORAL_TABLET | Freq: Every day | ORAL | 1 refills | Status: AC

## 2019-01-07 NOTE — Progress Notes (Signed)
Virtual Visit via Telephone Note  I connected with [name], on 01/07/2019 at 2:13 PM by telephone due to the COVID-19 pandemic and verified that I am speaking with the correct person using two identifiers.   Consent: I discussed the limitations, risks, security and privacy concerns of performing an evaluation and management service by telephone and the availability of in person appointments. I also discussed with the patient that there may be a patient responsible charge related to this service. The patient expressed understanding and agreed to proceed.   Location of Patient: Home  Location of Provider: Clinic   Persons participating in Telemedicine visit: Lavell B Frutoso Chase Farrington-CMA Dr. Felecia Shelling     History of Present Illness: Kristen Chavez is a 68 year old female who was last seen in the clinic by her PCP, Dr. Wynetta Emery 1 year ago.  Medical history is significant for hypertension, depression, osteoarthritis. She was scheduled for a WebEx visit however was unable to connect and so visit was switched to telephonic encounter. She complains of a one-week history of rattling in her chest and deep breathing, dry cough, postnasal drip, nasal congestion.  She denies fever, myalgias, headache, nausea, vomiting, diarrhea or contact with person with COVID-19.  Yesterday she noticed on rising from a sitting position she stumbled but did not fall. She also complains of intermittent abdominal cramps which last about 10 minutes then resolve and these have been infrequent for the last 1 year.  She denies presence of reflux, history of irritable bowel syndrome and cramps unrelated to meals.  She is not up-to-date on her colonoscopy.   Past Medical History:  Diagnosis Date  . Anxiety   . Arthritis    osteoarthritis.  . Depression   . Hypertension    No Known Allergies  Current Outpatient Medications on File Prior to Visit  Medication Sig Dispense Refill  . acetaminophen  (TYLENOL) 500 MG tablet Take 1 tablet (500 mg total) by mouth every 6 (six) hours as needed for moderate pain or headache. 30 tablet 1  . buPROPion (WELLBUTRIN XL) 300 MG 24 hr tablet Take 1 tablet (300 mg total) by mouth daily. 30 tablet 1  . citalopram (CELEXA) 40 MG tablet Take 1 tablet (40 mg total) by mouth daily. 30 tablet 2  . Flavoring Agent (GRAPEFRUIT FLAVOR) OIL Apply 1 application topically daily as needed (knee pain). White grapefruit oil    . hydrochlorothiazide (HYDRODIURIL) 25 MG tablet Take 1 tablet (25 mg total) by mouth daily. 30 tablet 1  . Lavender Oil OIL Apply 1 application topically 2 (two) times daily as needed (knee pain).    Marland Kitchen lisinopril (PRINIVIL,ZESTRIL) 40 MG tablet Take 1 tablet (40 mg total) by mouth daily. 30 tablet 1  . Multiple Vitamin (MULTIVITAMIN WITH MINERALS) TABS tablet Take 1 tablet by mouth daily.     No current facility-administered medications on file prior to visit.     Observations/Objective: Awake, alert, oriented x3 Not in acute distress  Assessment and Plan: 1. Acute non-recurrent sinusitis of other sinus No antibiotic indicated at this time Saline nasal rinses - fluticasone (FLONASE) 50 MCG/ACT nasal spray; Place 2 sprays into both nostrils daily.  Dispense: 16 g; Refill: 1 - cetirizine (ZYRTEC) 10 MG tablet; Take 1 tablet (10 mg total) by mouth daily.  Dispense: 30 tablet; Refill: 1  2. Generalized abdominal pain Pain is absent at this time I have referred her for colonoscopy Advised that she will need an in person visit as she may need  Helicobacter pylori breath test PPI not indicated at this time - Ambulatory referral to Gastroenterology  3. Screening for colon cancer - Ambulatory referral to Gastroenterology   Follow Up Instructions: Schedule with PCP for 1 month follow-up   I discussed the assessment and treatment plan with the patient. The patient was provided an opportunity to ask questions and all were answered. The  patient agreed with the plan and demonstrated an understanding of the instructions.   The patient was advised to call back or seek an in-person evaluation if the symptoms worsen or if the condition fails to improve as anticipated.     I provided 16 minutes total of non-face-to-face time during this encounter including median intraservice time, reviewing previous notes, labs, imaging, medications, management and patient verbalized understanding.     Charlott Rakes, MD, FAAFP. [name] [place] [facility] [place], [place]   01/07/2019, 2:13 PM

## 2019-01-07 NOTE — Progress Notes (Signed)
Patient has been called and DOB has been verified. Patient has been screened and transferred to PCP to start phone visit.     

## 2019-01-08 MED FILL — FLUTICASONE PROP 50 MCG SPR: 50 | 30 days supply | Qty: 16 | Fill #0

## 2019-01-22 MED FILL — METHOTREXATE SODIUM 2.5 MG: 2.5 | 28 days supply | Qty: 40 | Fill #0

## 2019-02-04 MED FILL — METHOTREXATE SODIUM 2.5 MG: 2.5 | 28 days supply | Qty: 40 | Fill #0

## 2019-02-07 ENCOUNTER — Ambulatory Visit: Payer: Self-pay | Admitting: Internal Medicine

## 2019-02-20 ENCOUNTER — Other Ambulatory Visit: Payer: Self-pay

## 2019-02-20 ENCOUNTER — Ambulatory Visit: Payer: Self-pay | Attending: Internal Medicine | Admitting: Physician Assistant

## 2019-02-20 DIAGNOSIS — R1084 Generalized abdominal pain: Secondary | ICD-10-CM

## 2019-02-20 DIAGNOSIS — F418 Other specified anxiety disorders: Secondary | ICD-10-CM

## 2019-02-20 DIAGNOSIS — Z760 Encounter for issue of repeat prescription: Secondary | ICD-10-CM

## 2019-02-20 MED ORDER — CITALOPRAM HYDROBROMIDE 40 MG PO TABS: 60.0000 mg | ORAL_TABLET | Freq: Every day | ORAL | 2 refills | Status: DC

## 2019-02-20 NOTE — Progress Notes (Signed)
Pt. Stated she is a lot of pain when her stomach when it's undulate.

## 2019-02-20 NOTE — Progress Notes (Signed)
Patient ID: Kristen Chavez, female   DOB: 01/29/1951, 68 y.o.   MRN: [id number] Virtual Visit via Telephone Note  I connected with [name] on 02/20/19 at  3:50 PM EDT by telephone and verified that I am speaking with the correct person using two identifiers.   I discussed the limitations, risks, security and privacy concerns of performing an evaluation and management service by telephone and the availability of in person appointments. I also discussed with the patient that there may be a patient responsible charge related to this service. The patient expressed understanding and agreed to proceed.  Patient location:  home My Location:  [place] office Persons on the call:  Me and the patient   History of Present Illness:  Patient c/o stomach spasms for a couple of years.  Denies diarrhea or constipation.  No N/V/D.  No melena.  No hematochezia.  Appetite is good.  No certain time of day.  No exacerbationg or alleviating factors.    Some depression and anxiety.  Feels stable but at night time feels like she goes over regrets in her head.  May want to try increasing the dose on meds.  No SI/HI.  This seems to have been worse only now with quarantine.      Observations/Objective:  A&Ox3   Assessment and Plan: 1. Depression with anxiety -notified Christa See and will have her f/up with patient.   Off label can increase celexa to 60mg .  Discussed self-care/letting go - Vitamin D, 25-hydroxy; Future - TSH; Future - citalopram (CELEXA) 40 MG tablet; Take 1.5 tablets (60 mg total) by mouth daily.  Dispense: 45 tablet; Refill: 2 -continue wellbutrin too  2. Generalized abdominal pain Non-acute abdomen - Ambulatory referral to Gastroenterology - Comprehensive metabolic panel; Future - H. pylori breath test; Future   Follow Up Instructions: With PCP in 1 month   I discussed the assessment and treatment plan with the patient. The patient was provided an opportunity to ask questions and  all were answered. The patient agreed with the plan and demonstrated an understanding of the instructions.   The patient was advised to call back or seek an in-person evaluation if the symptoms worsen or if the condition fails to improve as anticipated.  I provided 16 minutes of non-face-to-face time during this encounter.   Freeman Caldron, PA-C

## 2019-02-21 ENCOUNTER — Other Ambulatory Visit: Payer: Self-pay | Admitting: Internal Medicine

## 2019-02-21 ENCOUNTER — Other Ambulatory Visit: Payer: Self-pay | Admitting: Physician Assistant

## 2019-02-21 DIAGNOSIS — I10 Essential (primary) hypertension: Secondary | ICD-10-CM

## 2019-02-21 DIAGNOSIS — Z760 Encounter for issue of repeat prescription: Secondary | ICD-10-CM

## 2019-02-21 DIAGNOSIS — F418 Other specified anxiety disorders: Secondary | ICD-10-CM

## 2019-02-21 MED ORDER — CITALOPRAM HYDROBROMIDE 40 MG PO TABS: 40.0000 mg | ORAL_TABLET | Freq: Every day | ORAL | 2 refills | Status: DC

## 2019-02-21 MED ORDER — TRAZODONE HCL 50 MG PO TABS: 25.0000 mg | ORAL_TABLET | Freq: Every evening | ORAL | 3 refills | Status: DC | PRN

## 2019-02-21 MED FILL — traZODone HCL 50 MG TABS: 50 | 30 days supply | Qty: 30 | Fill #0

## 2019-02-21 MED FILL — CITALOPRAM HBR 40 MG TABLET: 40 | 30 days supply | Qty: 30 | Fill #0

## 2019-02-21 MED FILL — FLUTICASONE PROP 50 MCG SPR: 50 | 30 days supply | Qty: 16 | Fill #0

## 2019-02-22 ENCOUNTER — Other Ambulatory Visit: Payer: Self-pay | Admitting: Nurse Practitioner

## 2019-02-25 MED FILL — LISINOPRIL 40 MG TABLET: 40 | 30 days supply | Qty: 30 | Fill #0

## 2019-02-25 MED FILL — HYDROCHLOROTHIAZIDE 25 MG T: 25 | 30 days supply | Qty: 30 | Fill #0

## 2019-02-27 ENCOUNTER — Other Ambulatory Visit: Payer: Self-pay

## 2019-03-01 ENCOUNTER — Other Ambulatory Visit: Payer: Self-pay

## 2019-03-01 ENCOUNTER — Ambulatory Visit: Payer: Self-pay | Attending: Internal Medicine | Admitting: Licensed Clinical Social Worker

## 2019-03-01 DIAGNOSIS — F419 Anxiety disorder, unspecified: Secondary | ICD-10-CM

## 2019-03-01 DIAGNOSIS — F331 Major depressive disorder, recurrent, moderate: Secondary | ICD-10-CM

## 2019-03-06 ENCOUNTER — Other Ambulatory Visit: Payer: Self-pay | Admitting: Internal Medicine

## 2019-03-06 DIAGNOSIS — F418 Other specified anxiety disorders: Secondary | ICD-10-CM

## 2019-03-06 DIAGNOSIS — Z760 Encounter for issue of repeat prescription: Secondary | ICD-10-CM

## 2019-03-06 MED FILL — BUPROPION HCL XL 300 MG TAB: 300 | 30 days supply | Qty: 30 | Fill #0

## 2019-03-06 MED FILL — METHOTREXATE SODIUM 2.5 MG: 2.5 | 28 days supply | Qty: 40 | Fill #1

## 2019-03-06 NOTE — BH Specialist Note (Signed)
Integrated Behavioral Health Visit via Telemedicine (Telephone)  03/01/2019 CAROLEE CHANNELL [id number]   Session Start time: 1:30 PM  Session End time: 1:50 PM Total time: 20 minutes  Referring Provider: Weyman Pedro Type of Visit: Telephonic Patient location: Home [facility] Provider location: Office All persons participating in visit: Patient and LCSW  Confirmed patient's address: Yes  Confirmed patient's phone number: Yes  Any changes to demographics: No   Confirmed patient's insurance: Yes  Any changes to patient's insurance: No   Discussed confidentiality: Yes    The following statements were read to the patient and/or legal guardian that are established with the [facility] Provider.  "The purpose of this phone visit is to provide behavioral health care while limiting exposure to the coronavirus (COVID19).  There is a possibility of technology failure and discussed alternative modes of communication if that failure occurs."  "By engaging in this telephone visit, you consent to the provision of healthcare.  Additionally, you authorize for your insurance to be billed for the services provided during this telephone visit."   Patient and/or legal guardian consented to telephone visit: Yes   PRESENTING CONCERNS: Patient and/or family reports the following symptoms/concerns: Pt reports symptoms of depression and anxiety Duration of problem: Ongoing; Severity of problem: moderate  STRENGTHS (Protective Factors/Coping Skills): Pt receives support from family. Adult daughter resides nearby and adult niece has been very supportive Pt is open to medication management to improve sleep and decrease symptoms  GOALS ADDRESSED: Patient will: 1.  Reduce symptoms of: anxiety, depression and insomnia  2.  Increase knowledge and/or ability of: coping skills and healthy habits  3.  Demonstrate ability to: Increase healthy adjustment to current life circumstances and Increase adequate  support systems for patient/family  INTERVENTIONS: Interventions utilized:  Solution-Focused Strategies, Mindfulness or Psychologist, educational, Supportive Counseling and Psychoeducation and/or Health Education Standardized Assessments completed: Not Needed  ASSESSMENT: Patient currently experiencing symptoms of depression and anxiety. Pt shared that she is having difficulty falling and staying asleep resulting in low motivation and energy throughout the day. Pt expresses feelings of guilt and shame; however, did not want to disclose triggers. She receives support from family who reside locally. Pt denies suicidal or homicidal ideations.    Patient may benefit from psychotherapy. She has agreed to medication management through PCP. Medications were mailed to address on file last week, they have yet to arrive. LCSW will follow up with patient on 03/06/2019 to confirm receipt of medications.   Therapeutic strategies were discussed with patient to assist in decreasing/managing reported symptoms and promoting positive mood. Pt was successful in identifying healthy coping skills (praying, self affirmations, and talking with family)  PLAN: 1. Follow up with behavioral health clinician on : LCSW will follow up with pt regarding receipt of medications 2. Behavioral recommendations: LCSW recommends pt utilize healthy coping skills discussed in session and comply with medication management 3. Referral(s): [facility] (In Clinic)  Rebekah Chesterfield, [place] 03/06/2019 12:26 PM

## 2019-03-07 ENCOUNTER — Telehealth: Payer: Self-pay | Admitting: Licensed Clinical Social Worker

## 2019-03-07 NOTE — Telephone Encounter (Signed)
Call placed to patient. Patient confirmed receipt of medications. No additional concerns noted.

## 2019-03-26 ENCOUNTER — Encounter (HOSPITAL_COMMUNITY): Payer: Self-pay | Admitting: Emergency Medicine

## 2019-03-26 ENCOUNTER — Inpatient Hospital Stay (HOSPITAL_COMMUNITY)
Admission: EM | Admit: 2019-03-26 | Discharge: 2019-04-04 | DRG: 181 | Disposition: A | Discharge status: Home Health | Payer: Medicare Other | Attending: Internal Medicine | Admitting: Internal Medicine

## 2019-03-26 ENCOUNTER — Other Ambulatory Visit: Payer: Self-pay

## 2019-03-26 ENCOUNTER — Emergency Department (HOSPITAL_COMMUNITY): Payer: Medicare Other

## 2019-03-26 DIAGNOSIS — D494 Neoplasm of unspecified behavior of bladder: Secondary | ICD-10-CM | POA: Diagnosis present

## 2019-03-26 DIAGNOSIS — R636 Underweight: Secondary | ICD-10-CM | POA: Diagnosis present

## 2019-03-26 DIAGNOSIS — R591 Generalized enlarged lymph nodes: Secondary | ICD-10-CM | POA: Diagnosis present

## 2019-03-26 DIAGNOSIS — D630 Anemia in neoplastic disease: Secondary | ICD-10-CM | POA: Diagnosis present

## 2019-03-26 DIAGNOSIS — Z209 Contact with and (suspected) exposure to unspecified communicable disease: Secondary | ICD-10-CM | POA: Diagnosis not present

## 2019-03-26 DIAGNOSIS — J90 Pleural effusion, not elsewhere classified: Secondary | ICD-10-CM | POA: Diagnosis not present

## 2019-03-26 DIAGNOSIS — R0600 Dyspnea, unspecified: Secondary | ICD-10-CM | POA: Diagnosis not present

## 2019-03-26 DIAGNOSIS — J910 Malignant pleural effusion: Secondary | ICD-10-CM

## 2019-03-26 DIAGNOSIS — Z515 Encounter for palliative care: Secondary | ICD-10-CM

## 2019-03-26 DIAGNOSIS — K5900 Constipation, unspecified: Secondary | ICD-10-CM | POA: Diagnosis present

## 2019-03-26 DIAGNOSIS — F329 Major depressive disorder, single episode, unspecified: Secondary | ICD-10-CM | POA: Diagnosis present

## 2019-03-26 DIAGNOSIS — C801 Malignant (primary) neoplasm, unspecified: Secondary | ICD-10-CM

## 2019-03-26 DIAGNOSIS — R079 Chest pain, unspecified: Secondary | ICD-10-CM | POA: Diagnosis not present

## 2019-03-26 DIAGNOSIS — I10 Essential (primary) hypertension: Secondary | ICD-10-CM | POA: Diagnosis present

## 2019-03-26 DIAGNOSIS — R918 Other nonspecific abnormal finding of lung field: Secondary | ICD-10-CM | POA: Diagnosis not present

## 2019-03-26 DIAGNOSIS — C3411 Malignant neoplasm of upper lobe, right bronchus or lung: Principal | ICD-10-CM | POA: Diagnosis present

## 2019-03-26 DIAGNOSIS — F1010 Alcohol abuse, uncomplicated: Secondary | ICD-10-CM | POA: Diagnosis present

## 2019-03-26 DIAGNOSIS — R0602 Shortness of breath: Secondary | ICD-10-CM | POA: Diagnosis not present

## 2019-03-26 DIAGNOSIS — Z801 Family history of malignant neoplasm of trachea, bronchus and lung: Secondary | ICD-10-CM

## 2019-03-26 DIAGNOSIS — C782 Secondary malignant neoplasm of pleura: Secondary | ICD-10-CM | POA: Diagnosis present

## 2019-03-26 DIAGNOSIS — Z8249 Family history of ischemic heart disease and other diseases of the circulatory system: Secondary | ICD-10-CM

## 2019-03-26 DIAGNOSIS — Z87891 Personal history of nicotine dependence: Secondary | ICD-10-CM

## 2019-03-26 DIAGNOSIS — Z833 Family history of diabetes mellitus: Secondary | ICD-10-CM

## 2019-03-26 DIAGNOSIS — R05 Cough: Secondary | ICD-10-CM | POA: Diagnosis not present

## 2019-03-26 DIAGNOSIS — R0902 Hypoxemia: Secondary | ICD-10-CM | POA: Diagnosis not present

## 2019-03-26 DIAGNOSIS — Z8261 Family history of arthritis: Secondary | ICD-10-CM

## 2019-03-26 DIAGNOSIS — Z7189 Other specified counseling: Secondary | ICD-10-CM

## 2019-03-26 DIAGNOSIS — Z9889 Other specified postprocedural states: Secondary | ICD-10-CM

## 2019-03-26 DIAGNOSIS — Z20828 Contact with and (suspected) exposure to other viral communicable diseases: Secondary | ICD-10-CM | POA: Diagnosis present

## 2019-03-26 DIAGNOSIS — Z6822 Body mass index (BMI) 22.0-22.9, adult: Secondary | ICD-10-CM

## 2019-03-26 DIAGNOSIS — Z9689 Presence of other specified functional implants: Secondary | ICD-10-CM

## 2019-03-26 DIAGNOSIS — F064 Anxiety disorder due to known physiological condition: Secondary | ICD-10-CM | POA: Diagnosis present

## 2019-03-26 DIAGNOSIS — M1990 Unspecified osteoarthritis, unspecified site: Secondary | ICD-10-CM | POA: Diagnosis present

## 2019-03-26 LAB — CBC WITH DIFFERENTIAL/PLATELET
Abs Immature Granulocytes: 0.09 10*3/uL — ABNORMAL HIGH (ref 0.00–0.07)
Basophils Absolute: 0.1 10*3/uL (ref 0.0–0.1)
Basophils Relative: 1 %
Eosinophils Absolute: 0.2 10*3/uL (ref 0.0–0.5)
Eosinophils Relative: 2 %
HCT: 36 % (ref 36.0–46.0)
Hemoglobin: 11.9 g/dL — ABNORMAL LOW (ref 12.0–15.0)
Immature Granulocytes: 1 %
Lymphocytes Relative: 15 %
Lymphs Abs: 1.3 10*3/uL (ref 0.7–4.0)
MCH: 27 pg (ref 26.0–34.0)
MCHC: 33.1 g/dL (ref 30.0–36.0)
MCV: 81.6 fL (ref 80.0–100.0)
Monocytes Absolute: 1.1 10*3/uL — ABNORMAL HIGH (ref 0.1–1.0)
Monocytes Relative: 13 %
Neutro Abs: 5.9 10*3/uL (ref 1.7–7.7)
Neutrophils Relative %: 68 %
Platelets: 435 10*3/uL — ABNORMAL HIGH (ref 150–400)
RBC: 4.41 MIL/uL (ref 3.87–5.11)
RDW: 14.8 % (ref 11.5–15.5)
WBC: 8.5 10*3/uL (ref 4.0–10.5)
nRBC: 0 % (ref 0.0–0.2)

## 2019-03-26 LAB — COMPREHENSIVE METABOLIC PANEL
ALT: 20 U/L (ref 0–44)
AST: 21 U/L (ref 15–41)
Albumin: 3.3 g/dL — ABNORMAL LOW (ref 3.5–5.0)
Alkaline Phosphatase: 85 U/L (ref 38–126)
Anion gap: 10 (ref 5–15)
BUN: 16 mg/dL (ref 8–23)
CO2: 23 mmol/L (ref 22–32)
Calcium: 9.2 mg/dL (ref 8.9–10.3)
Chloride: 104 mmol/L (ref 98–111)
Creatinine, Ser: 1.04 mg/dL — ABNORMAL HIGH (ref 0.44–1.00)
GFR calc Af Amer: >60 mL/min (ref >60)
GFR calc non Af Amer: 54 mL/min — ABNORMAL LOW (ref >60)
Glucose, Bld: 123 mg/dL — ABNORMAL HIGH (ref 70–99)
Potassium: 3.8 mmol/L (ref 3.5–5.1)
Sodium: 137 mmol/L (ref 135–145)
Total Bilirubin: 0.4 mg/dL (ref 0.3–1.2)
Total Protein: 7.3 g/dL (ref 6.5–8.1)

## 2019-03-26 NOTE — ED Triage Notes (Addendum)
Pt to ED via [facility] with c/o shortness of breath and cough x's 1 week.  Also c/o chills off and on  Pt c/o right sided chest pain

## 2019-03-27 ENCOUNTER — Inpatient Hospital Stay (HOSPITAL_COMMUNITY): Payer: Medicare Other

## 2019-03-27 ENCOUNTER — Emergency Department (HOSPITAL_COMMUNITY): Payer: Medicare Other

## 2019-03-27 DIAGNOSIS — I10 Essential (primary) hypertension: Secondary | ICD-10-CM | POA: Diagnosis not present

## 2019-03-27 DIAGNOSIS — F1010 Alcohol abuse, uncomplicated: Secondary | ICD-10-CM | POA: Diagnosis present

## 2019-03-27 DIAGNOSIS — F064 Anxiety disorder due to known physiological condition: Secondary | ICD-10-CM | POA: Diagnosis present

## 2019-03-27 DIAGNOSIS — C3491 Malignant neoplasm of unspecified part of right bronchus or lung: Secondary | ICD-10-CM | POA: Diagnosis not present

## 2019-03-27 DIAGNOSIS — C3411 Malignant neoplasm of upper lobe, right bronchus or lung: Secondary | ICD-10-CM | POA: Diagnosis present

## 2019-03-27 DIAGNOSIS — M1990 Unspecified osteoarthritis, unspecified site: Secondary | ICD-10-CM | POA: Diagnosis present

## 2019-03-27 DIAGNOSIS — J90 Pleural effusion, not elsewhere classified: Secondary | ICD-10-CM | POA: Diagnosis present

## 2019-03-27 DIAGNOSIS — R591 Generalized enlarged lymph nodes: Secondary | ICD-10-CM | POA: Diagnosis present

## 2019-03-27 DIAGNOSIS — C782 Secondary malignant neoplasm of pleura: Secondary | ICD-10-CM | POA: Diagnosis present

## 2019-03-27 DIAGNOSIS — R636 Underweight: Secondary | ICD-10-CM | POA: Diagnosis present

## 2019-03-27 DIAGNOSIS — D630 Anemia in neoplastic disease: Secondary | ICD-10-CM | POA: Diagnosis present

## 2019-03-27 DIAGNOSIS — F418 Other specified anxiety disorders: Secondary | ICD-10-CM | POA: Diagnosis not present

## 2019-03-27 DIAGNOSIS — Z833 Family history of diabetes mellitus: Secondary | ICD-10-CM | POA: Diagnosis not present

## 2019-03-27 DIAGNOSIS — I672 Cerebral atherosclerosis: Secondary | ICD-10-CM | POA: Diagnosis not present

## 2019-03-27 DIAGNOSIS — Z20828 Contact with and (suspected) exposure to other viral communicable diseases: Secondary | ICD-10-CM | POA: Diagnosis present

## 2019-03-27 DIAGNOSIS — R0602 Shortness of breath: Secondary | ICD-10-CM | POA: Diagnosis not present

## 2019-03-27 DIAGNOSIS — Z8249 Family history of ischemic heart disease and other diseases of the circulatory system: Secondary | ICD-10-CM | POA: Diagnosis not present

## 2019-03-27 DIAGNOSIS — Z4682 Encounter for fitting and adjustment of non-vascular catheter: Secondary | ICD-10-CM | POA: Diagnosis not present

## 2019-03-27 DIAGNOSIS — K5900 Constipation, unspecified: Secondary | ICD-10-CM | POA: Diagnosis present

## 2019-03-27 DIAGNOSIS — F329 Major depressive disorder, single episode, unspecified: Secondary | ICD-10-CM | POA: Diagnosis present

## 2019-03-27 DIAGNOSIS — D494 Neoplasm of unspecified behavior of bladder: Secondary | ICD-10-CM | POA: Diagnosis present

## 2019-03-27 DIAGNOSIS — Z85118 Personal history of other malignant neoplasm of bronchus and lung: Secondary | ICD-10-CM | POA: Diagnosis not present

## 2019-03-27 DIAGNOSIS — J910 Malignant pleural effusion: Secondary | ICD-10-CM | POA: Diagnosis not present

## 2019-03-27 DIAGNOSIS — R918 Other nonspecific abnormal finding of lung field: Secondary | ICD-10-CM | POA: Diagnosis not present

## 2019-03-27 DIAGNOSIS — R05 Cough: Secondary | ICD-10-CM | POA: Diagnosis not present

## 2019-03-27 DIAGNOSIS — Z801 Family history of malignant neoplasm of trachea, bronchus and lung: Secondary | ICD-10-CM | POA: Diagnosis not present

## 2019-03-27 DIAGNOSIS — Z8261 Family history of arthritis: Secondary | ICD-10-CM | POA: Diagnosis not present

## 2019-03-27 DIAGNOSIS — Z6822 Body mass index (BMI) 22.0-22.9, adult: Secondary | ICD-10-CM | POA: Diagnosis not present

## 2019-03-27 DIAGNOSIS — Z87891 Personal history of nicotine dependence: Secondary | ICD-10-CM | POA: Diagnosis not present

## 2019-03-27 LAB — LACTATE DEHYDROGENASE, PLEURAL OR PERITONEAL FLUID: LD, Fluid: 373 U/L — ABNORMAL HIGH (ref 3–23)

## 2019-03-27 LAB — PROTEIN, TOTAL: Total Protein: 7 g/dL (ref 6.5–8.1)

## 2019-03-27 LAB — LACTATE DEHYDROGENASE: LDH: 180 U/L (ref 98–192)

## 2019-03-27 LAB — BODY FLUID CELL COUNT WITH DIFFERENTIAL
Eos, Fluid: 2 %
Lymphs, Fluid: 32 %
Monocyte-Macrophage-Serous Fluid: 36 % — ABNORMAL LOW (ref 50–90)
Neutrophil Count, Fluid: 30 % — ABNORMAL HIGH (ref 0–25)
Total Nucleated Cell Count, Fluid: 990 cu mm (ref 0–1000)

## 2019-03-27 LAB — PROTEIN, PLEURAL OR PERITONEAL FLUID: Total protein, fluid: 5.1 g/dL

## 2019-03-27 LAB — SARS CORONAVIRUS 2 (TAT 6-24 HRS): SARS Coronavirus 2: NEGATIVE

## 2019-03-27 MED ORDER — LORAZEPAM 1 MG PO TABS: 1.0000 mg | ORAL_TABLET | ORAL | Status: DC | PRN

## 2019-03-27 MED ORDER — METHOTREXATE 2.5 MG PO TABS: 25.0000 mg | ORAL_TABLET | ORAL | Status: DC

## 2019-03-27 MED ORDER — ACETAMINOPHEN 650 MG RE SUPP: 650.0000 mg | Freq: Four times a day (QID) | RECTAL | Status: DC | PRN

## 2019-03-27 MED ORDER — POLYETHYLENE GLYCOL 3350 17 G PO PACK
17.0000 g | PACK | Freq: Every day | ORAL | Status: DC
  Administered 2019-03-27 – 2019-04-04 (×5): 17 g via ORAL
  Filled 2019-03-27 (×8): qty 1

## 2019-03-27 MED ORDER — ALBUTEROL SULFATE HFA 108 (90 BASE) MCG/ACT IN AERS
4.0000 | INHALATION_SPRAY | Freq: Once | RESPIRATORY_TRACT | Status: AC
  Administered 2019-03-27: 09:00:00 4 via RESPIRATORY_TRACT
  Filled 2019-03-27: qty 6.7

## 2019-03-27 MED ORDER — LORAZEPAM 2 MG/ML IJ SOLN
1.0000 mg | INTRAMUSCULAR | Status: AC | PRN
  Administered 2019-03-27 – 2019-03-28 (×2): 2 mg via INTRAVENOUS
  Administered 2019-03-28 – 2019-03-29 (×2): 1 mg via INTRAVENOUS
  Filled 2019-03-27 (×4): qty 1

## 2019-03-27 MED ORDER — SODIUM CHLORIDE 0.9 % IV BOLUS
500.0000 mL | Freq: Once | INTRAVENOUS | Status: AC
  Administered 2019-03-27: 09:00:00 500 mL via INTRAVENOUS

## 2019-03-27 MED ORDER — LISINOPRIL 40 MG PO TABS
40.0000 mg | ORAL_TABLET | Freq: Every day | ORAL | Status: DC
  Administered 2019-03-28 – 2019-03-29 (×2): 40 mg via ORAL
  Filled 2019-03-27: qty 1
  Filled 2019-03-27: qty 2
  Filled 2019-03-27 (×2): qty 1

## 2019-03-27 MED ORDER — CITALOPRAM HYDROBROMIDE 20 MG PO TABS
20.0000 mg | ORAL_TABLET | Freq: Every day | ORAL | Status: DC
  Administered 2019-03-28 – 2019-04-04 (×8): 20 mg via ORAL
  Filled 2019-03-27 (×3): qty 1
  Filled 2019-03-27: qty 2
  Filled 2019-03-27 (×6): qty 1

## 2019-03-27 MED ORDER — IOHEXOL 300 MG/ML  SOLN
75.0000 mL | Freq: Once | INTRAMUSCULAR | Status: AC
  Administered 2019-03-27: 09:00:00 75 mL via INTRAVENOUS

## 2019-03-27 MED ORDER — BUPROPION HCL ER (XL) 150 MG PO TB24
300.0000 mg | ORAL_TABLET | Freq: Every day | ORAL | Status: DC
  Administered 2019-03-28 – 2019-04-04 (×8): 300 mg via ORAL
  Filled 2019-03-27 (×10): qty 2

## 2019-03-27 MED ORDER — SODIUM CHLORIDE 0.9% FLUSH
3.0000 mL | Freq: Two times a day (BID) | INTRAVENOUS | Status: DC
  Administered 2019-03-27 – 2019-04-04 (×11): 3 mL via INTRAVENOUS

## 2019-03-27 MED ORDER — METHOTREXATE 2.5 MG PO TABS: 2.5000 mg | ORAL_TABLET | ORAL | Status: DC

## 2019-03-27 MED ORDER — ONDANSETRON HCL 4 MG/2ML IJ SOLN: 4.0000 mg | Freq: Four times a day (QID) | INTRAMUSCULAR | Status: DC | PRN

## 2019-03-27 MED ORDER — ONDANSETRON HCL 4 MG PO TABS: 4.0000 mg | ORAL_TABLET | Freq: Four times a day (QID) | ORAL | Status: DC | PRN

## 2019-03-27 MED ORDER — ENOXAPARIN SODIUM 40 MG/0.4ML ~~LOC~~ SOLN
40.0000 mg | SUBCUTANEOUS | Status: DC
  Administered 2019-03-27 – 2019-04-04 (×9): 40 mg via SUBCUTANEOUS
  Filled 2019-03-27 (×9): qty 0.4

## 2019-03-27 MED ORDER — ACETAMINOPHEN 325 MG PO TABS
650.0000 mg | ORAL_TABLET | Freq: Four times a day (QID) | ORAL | Status: DC | PRN
  Administered 2019-03-28 – 2019-03-31 (×3): 650 mg via ORAL
  Filled 2019-03-27 (×4): qty 2

## 2019-03-27 MED ORDER — ADULT MULTIVITAMIN W/MINERALS CH
1.0000 | ORAL_TABLET | Freq: Every day | ORAL | Status: DC
  Administered 2019-03-27 – 2019-04-04 (×9): 1 via ORAL
  Filled 2019-03-27 (×9): qty 1

## 2019-03-27 MED ORDER — AEROCHAMBER PLUS FLO-VU LARGE MISC
1.0000 | Freq: Once | Status: AC
  Administered 2019-03-27: 1

## 2019-03-27 MED ORDER — VITAMIN B-1 100 MG PO TABS
100.0000 mg | ORAL_TABLET | Freq: Every day | ORAL | Status: DC
  Administered 2019-03-27 – 2019-04-04 (×9): 100 mg via ORAL
  Filled 2019-03-27 (×9): qty 1

## 2019-03-27 MED ORDER — SENNOSIDES-DOCUSATE SODIUM 8.6-50 MG PO TABS
2.0000 | ORAL_TABLET | Freq: Two times a day (BID) | ORAL | Status: DC
  Administered 2019-03-27 – 2019-04-04 (×7): 2 via ORAL
  Filled 2019-03-27 (×13): qty 2

## 2019-03-27 MED ORDER — LACTATED RINGERS IV SOLN
INTRAVENOUS | Status: AC
  Administered 2019-03-27 – 2019-03-28 (×3): via INTRAVENOUS

## 2019-03-27 MED ORDER — ENSURE ENLIVE PO LIQD
237.0000 mL | Freq: Two times a day (BID) | ORAL | Status: DC
  Administered 2019-03-28 – 2019-04-02 (×8): 237 mL via ORAL

## 2019-03-27 MED ORDER — METHYLPREDNISOLONE SODIUM SUCC 125 MG IJ SOLR
125.0000 mg | Freq: Once | INTRAMUSCULAR | Status: AC
  Administered 2019-03-27: 125 mg via INTRAVENOUS
  Filled 2019-03-27: qty 2

## 2019-03-27 MED ORDER — LORAZEPAM 2 MG/ML IJ SOLN: 1.0000 mg | INTRAMUSCULAR | Status: DC | PRN

## 2019-03-27 MED ORDER — FOLIC ACID 1 MG PO TABS
1.0000 mg | ORAL_TABLET | Freq: Every day | ORAL | Status: DC
  Administered 2019-03-28 – 2019-04-04 (×8): 1 mg via ORAL
  Filled 2019-03-27 (×9): qty 1

## 2019-03-27 NOTE — Procedures (Signed)
Thoracentesis Procedure Note  Pre-operative Diagnosis: R Pleural Effusion  Post-operative Diagnosis: same  Indications: Dyspnea, pleural effusion  Procedure Details  Consent: Informed consent was obtained. Risks of the procedure were discussed including: infection, bleeding, pain, pneumothorax.  Under sterile conditions the patient was positioned. Betadine solution and sterile drapes were utilized.  1% plain lidocaine was used to anesthetize the 5th rib space. Fluid was obtained without any difficulties and minimal blood loss.  A dressing was applied to the wound and wound care instructions were provided.   Findings 1200 ml of bloody pleural fluid was obtained. A sample was sent to Pathology for cytogenetics, flow, and cell counts, culture, gram stain as well as for infection analysis.  Complications:  None; patient tolerated the procedure well.          Condition: stable  Plan A follow up chest x-ray was ordered. Bed Rest for 1 hours. Tylenol 650 mg. for pain.

## 2019-03-27 NOTE — ED Provider Notes (Signed)
[facility] EMERGENCY DEPARTMENT Provider Note   CSN: [id number] Arrival date & time: 03/26/19  2151     History   Chief Complaint Chief Complaint  Patient presents with  . Shortness of Breath  . Cough    HPI Kristen Chavez is a 68 y.o. female.     Pt presents to the ED today with cough, cp, weight loss, and decreased appetite.  She denies f/c or known covid exposures.  CP is on the right.  She unfortunately waited for over 10 hrs before coming back to a room as we were holding admitted patients.  While in the waiting room, she had a cxr which showed a mass and pleural effusion.  She is a former smoker (quit 3 years ago).     Past Medical History:  Diagnosis Date  . Anxiety   . Arthritis    osteoarthritis.  . Depression   . Hypertension     Patient Active Problem List   Diagnosis Date Noted  . Corn of toe 12/11/2017  . Weight loss, unintentional 12/11/2017  . Marijuana use 12/11/2017  . Smoking 08/26/2013  . ANXIETY 11/25/2007  . DEPRESSION 11/25/2007  . Essential hypertension 11/25/2007  . Osteoarthritis 11/25/2007  . 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 TONSILLECTOMY       OB History   No obstetric history on file.      Home Medications    Prior to Admission medications   Medication Sig Start Date End Date Taking? Authorizing Provider  acetaminophen (TYLENOL) 500 MG tablet Take 1 tablet (500 mg total) by mouth every 6 (six) hours as needed for moderate pain or headache. 08/18/17  Yes Hairston, Mandesia R, FNP  buPROPion (WELLBUTRIN XL) 300 MG 24 hr tablet TAKE 1 TABLET (300 MG TOTAL) BY MOUTH DAILY. 03/06/19   [name], MD  cetirizine (ZYRTEC) 10 MG tablet Take 1 tablet (10 mg total) by mouth daily. 01/07/19   Charlott Rakes, MD  citalopram (CELEXA) 40 MG tablet Take 1 tablet (40 mg total) by mouth daily. 02/21/19   Argentina Donovan, PA-C  Flavoring  Agent (GRAPEFRUIT FLAVOR) OIL Apply 1 application topically daily as needed (knee pain). White grapefruit oil    [provider]  fluticasone (FLONASE) 50 MCG/ACT nasal spray Place 2 sprays into both nostrils daily. 01/07/19   Charlott Rakes, MD  hydrochlorothiazide (HYDRODIURIL) 25 MG tablet TAKE 1 TABLET BY MOUTH DAILY. 02/22/19   [name], MD  Lavender Oil OIL Apply 1 application topically 2 (two) times daily as needed (knee pain).    [provider]  lisinopril (ZESTRIL) 40 MG tablet TAKE 1 TABLET BY MOUTH DAILY. 02/22/19   [name], MD  methotrexate (RHEUMATREX) 2.5 MG tablet Take 2.5 mg by mouth once a week. Caution:Chemotherapy. Protect from light.    [provider]  Multiple Vitamin (MULTIVITAMIN WITH MINERALS) TABS tablet Take 1 tablet by mouth daily.    [provider]  traZODone (DESYREL) 50 MG tablet Take 0.5-1 tablets (25-50 mg total) by mouth at bedtime as needed for sleep. 02/21/19   Argentina Donovan, PA-C    Family History Family History  Problem Relation Age of Onset  . Hypertension Mother   . Rheum arthritis Mother   . Other Father        suicide  . Diabetes Paternal Grandmother   . Diabetes Paternal Grandfather  Social History Social History   Tobacco Use  . Smoking status: Former Smoker    Packs/day: 0.25    Types: Cigarettes    Quit date: 04/14/2014    Years since quitting: 4.9  . Smokeless tobacco: Never Used  Substance Use Topics  . Alcohol use: Yes    Alcohol/week: 21.0 standard drinks    Types: 21 Glasses of wine per week    Comment: socially wine  . Drug use: Yes    Types: Marijuana     Allergies   Patient has no known allergies.   Review of Systems Review of Systems  Respiratory: Positive for shortness of breath.   Cardiovascular: Positive for chest pain.  All other systems reviewed and are negative.    Physical Exam Updated Vital Signs BP 107/87   Pulse 88   Temp 98.3 F (36.8  C) (Oral)   Resp (!) 21   Ht 5\' 4"  (1.626 m)   Wt 59 kg   SpO2 95%   BMI 22.31 kg/m   Physical Exam Vitals signs and nursing note reviewed.  Constitutional:      Appearance: She is well-developed.  HENT:     Head: Normocephalic and atraumatic.     Mouth/Throat:     Mouth: Mucous membranes are moist.     Pharynx: Oropharynx is clear.  Eyes:     Extraocular Movements: Extraocular movements intact.     Pupils: Pupils are equal, round, and reactive to light.  Neck:     Musculoskeletal: Normal range of motion and neck supple.  Cardiovascular:     Rate and Rhythm: Normal rate and regular rhythm.  Pulmonary:     Effort: Pulmonary effort is normal.     Breath sounds: Wheezing present.  Abdominal:     General: Bowel sounds are normal.     Palpations: Abdomen is soft.  Musculoskeletal: Normal range of motion.  Skin:    General: Skin is warm.     Capillary Refill: Capillary refill takes less than 2 seconds.  Neurological:     General: No focal deficit present.     Mental Status: She is alert and oriented to person, place, and time.  Psychiatric:        Mood and Affect: Mood normal.        Behavior: Behavior normal.      ED Treatments / Results  Labs (all labs ordered are listed, but only abnormal results are displayed) Labs Reviewed  CBC WITH DIFFERENTIAL/PLATELET - Abnormal; Notable for the following components:      Result Value   Hemoglobin 11.9 (*)    Platelets 435 (*)    Monocytes Absolute 1.1 (*)    Abs Immature Granulocytes 0.09 (*)    All other components within normal limits  COMPREHENSIVE METABOLIC PANEL - Abnormal; Notable for the following components:   Glucose, Bld 123 (*)    Creatinine, Ser 1.04 (*)    Albumin 3.3 (*)    GFR calc non Af Amer 54 (*)    All other components within normal limits  SARS CORONAVIRUS 2 (TAT 6-24 HRS)  CYTOLOGY - NON PAP    EKG EKG Interpretation  Date/Time:  Tuesday March 26 2019 21:56:32 EDT Ventricular Rate:   110 PR Interval:  136 QRS Duration: 68 QT Interval:  316 QTC Calculation: 427 R Axis:   22 Text Interpretation:  Sinus tachycardia RSR' or QR pattern in V1 suggests right ventricular conduction delay Anteroseptal infarct , age undetermined Abnormal ECG No significant  change since last tracing Confirmed by Isla Pence [id number]) on 03/27/2019 8:11:44 AM   Radiology Dg Chest 2 View  Result Date: 03/26/2019 CLINICAL DATA:  Right-sided chest pain and shortness of breath EXAM: CHEST - 2 VIEW COMPARISON:  10/28/2017 FINDINGS: Cardiac shadow is within normal limits. Right-sided pleural effusion and basilar consolidation is noted. Significant increased density is noted in the right paratracheal region suspicious for lymphadenopathy. Changes are suspicious for underlying neoplasm. Pleural based mass lesions in the right apex are noted as well. IMPRESSION: Changes suspicious for right-sided pulmonary neoplasm with pleural base metastatic disease and mediastinal adenopathy. Large right pleural effusion is noted with likely underlying atelectasis. CT of the chest is recommended with contrast for further evaluation. Electronically Signed   By: Inez Catalina M.D.   On: 03/26/2019 23:03   Ct Chest W Contrast  Result Date: 03/27/2019 CLINICAL DATA:  Pleural effusion.  Increasing shortness of breath EXAM: CT CHEST WITH CONTRAST TECHNIQUE: Multidetector CT imaging of the chest was performed during intravenous contrast administration. CONTRAST:  75mL OMNIPAQUE IOHEXOL 300 MG/ML  SOLN COMPARISON:  Chest x-ray from earlier today FINDINGS: Cardiovascular: No cardiomegaly. No pericardial effusion. Malignant distortion/narrowing of right-sided pulmonary arteries. No acute vascular finding. Mediastinum/Nodes: Right upper lobe mass encasing the central vessels with heterogeneous central enhancement attributed to necrosis. Thoracic adenopathy at the right hilum, subcarinal station, paratracheal station, and AP window, and left  pulmonary hilum. Confluent right hilar nodal mass measures up to 4.3 cm on axial slices. Lungs/Pleura: Right upper lobe mass which is branching and blends with the hilar mass, limiting reproducible measurement. There is a large right pleural effusion with multifocal pleural nodularity. No left lung metastasis. No suspected superimposed edema or pneumonia. Upper Abdomen: No acute finding or evident metastasis. Musculoskeletal: Advanced thoracic spine degeneration with multilevel osteitis. No acute or aggressive finding. 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 The right pleural effusion is large volume. Electronically Signed   By: Monte Fantasia M.D.   On: 03/27/2019 09:37    Procedures Procedures (including critical care time)  Medications Ordered in ED Medications  sodium chloride 0.9 % bolus 500 mL (0 mLs Intravenous Stopped 03/27/19 1011)  methylPREDNISolone sodium succinate (SOLU-MEDROL) 125 mg/2 mL injection 125 mg (125 mg Intravenous Given 03/27/19 0834)  albuterol (VENTOLIN HFA) 108 (90 Base) MCG/ACT inhaler 4 puff (4 puffs Inhalation Given 03/27/19 0835)  AeroChamber Plus Flo-Vu Large MISC 1 each (1 each Other Given 03/27/19 0838)  iohexol (OMNIPAQUE) 300 MG/ML solution 75 mL (75 mLs Intravenous Contrast Given 03/27/19 0853)     Initial Impression / Assessment and Plan / ED Course  I have reviewed the triage vital signs and the nursing notes.  Pertinent labs & imaging results that were available during my care of the patient were reviewed by me and considered in my medical decision making (see chart for details).   Pt d/w IR who agreed to do a thoracentesis.  Fluid will be sent for cytology.  Pt d/w Dr. Alen Blew (oncology) who recommended that pt be admitted for work up as the cancer is fairly advanced.  He will see her after there has been a biopsy done and a result.   Pt d/w IMTS for admission.   JAKIERA EHLER was evaluated in Emergency Department on 03/27/2019 for the symptoms described in the history of present illness. She was evaluated in the context of the global COVID-19 pandemic, which necessitated consideration that the  patient might be at risk for infection with the SARS-CoV-2 virus that causes COVID-19. Institutional protocols and algorithms that pertain to the evaluation of patients at risk for COVID-19 are in a state of rapid change based on information released by regulatory bodies including the CDC and federal and state organizations. These policies and algorithms were followed during the patient's care in the ED.   Final Clinical Impressions(s) / ED Diagnoses   Final diagnoses:  Pleural effusion  Pleural effusion, right  Mass of upper lobe of right lung    ED Discharge Orders    None       Isla Pence, MD 03/27/19 1119

## 2019-03-27 NOTE — ED Notes (Signed)
Patient transported to CT 

## 2019-03-27 NOTE — H&P (Addendum)
Date: 03/27/2019               Patient Name:  Kristen Chavez MRN: [id number]  DOB: 1951-05-14 Age / Sex: 68 y.o., female   PCP: Kristen Pier, MD              Medical Service: Internal Medicine Teaching Service              Attending Physician: Dr. Aldine Contes, MD    First Contact: Armandina Stammer, [place] Pager: [phone]  Second Contact: Dr. Jeanmarie Hubert  Pager: [phone]  Third Contact Dr. Georgia Lopes Pager: [phone]       After Hours (After 5p/  First Contact Pager: [phone]  weekends / holidays): Second Contact Pager: [phone]   Chief Complaint: "Chest pain"  History of Present Illness:  Kristen Chavez is a 68 y.o. female with history of HTN, OA, and depression who presents with chest pain, dyspnea, and fatigue.  Patient has been experiencing symptoms for the past few months that intensifyed during the past few weeks. Patient describes chest pain as constant sharp pain on the right side of chest that exacerbates with inspiration and movement and is sometimes accompanied with wheezing and respiratory distress. Patient also reports dry cough that sometimes brings up thick, clear sputum, no blood.  Patient also reports that for the past few weeks, she feels tired all the time and is unable to complete household chores or eat. Retains appetite, however can take only a few bites before stopping due to low energy. Patient has noted that she has lost a considerable amount of weight in the last year, but is unsure of amount. Last night, she felt she was unable to breath properly and presented at the ED. Patient denies fever, chills, nausea, vomiting, diarrhea, dysuria.  CXR was completed and showed right-sided pleural effusion and basilar consolidation, significant increased density in right paratracheal region suspicious for lymphadenopathy and pleural based mass lesions in the right apex.   Meds: Current Meds  Medication Sig  . acetaminophen (TYLENOL) 500 MG tablet Take 1  tablet (500 mg total) by mouth every 6 (six) hours as needed for moderate pain or headache.  +buPROPion (WELLBUTRIN XL) 300 MG 24 hr tablet  +cetirizine (ZYRTEC) 10 MG tablet   +citalopram (Celexa) 40 mg tablet +fluticasone (Flonase) 60mcg/act  two sprays daily +hydrochlorothiazide (Hydrodiuril) 25 mg tablet +lisinopril (Zestril) 40 mg +methotrexate (Rheumatrex) 2.5 mg tablet +trazodone (Desyrel) 50 mg (Patient has only taken twice)   Allergies: Allergies as of 03/26/2019  . (No Known Allergies)   Past Medical History:  Diagnosis Date  . Anxiety   . Arthritis    osteoarthritis.  . Depression   . Hypertension    Family History:  Heart disease: None Diabetes: Paternal grandparents Cancer: Two brothers died of cancer. One brother had lung cancer, patient is unsure what type other brother had.   Social History:  Patient lives at home by herself. Daughter and granddaughter lives nearby.  Patient quit smoking 3 years ago with a 50 pack-year smoking history. Patient consumes >5 drinks of alcohol a day.  Patient reports history of marijuana use through smoking  Retired, used to work in [employer], [employer]  Review of Systems: Review of Systems  Constitutional: Positive for chills. Negative for fever.  Respiratory: Negative for hemoptysis.   Cardiovascular: Negative for leg swelling.  Gastrointestinal: Positive for constipation. Negative for abdominal pain, blood in stool and diarrhea.       Patient reports constipation  for the past week.  Patient reports episode of nausea and vomiting last week.  Genitourinary: Negative for dysuria and urgency.  Musculoskeletal: Negative for myalgias.  Neurological: Positive for dizziness and tingling. Negative for sensory change, focal weakness, weakness and headaches.       Patient reports intermittent dizziness when rising.  Patient reports intermittent tingling in the fingers of her left hand.    Physical Exam: Blood pressure (!)  110/93, pulse 95, temperature 98.3 F (36.8 C), temperature source Oral, resp. rate 20, height 5\' 4"  (1.626 m), weight 59 kg, SpO2 94 %.   Physical Exam  Constitutional: She appears well-developed and well-nourished. She is not intubated.  HENT:  Head: Normocephalic and atraumatic.  Mouth/Throat: Oropharynx is clear and moist.  Eyes: Pupils are equal, round, and reactive to light. EOM are normal. Right eye exhibits no discharge. Left eye exhibits no discharge.  Neck: Normal range of motion. Neck supple.  Cardiovascular: Regular rhythm, normal heart sounds and intact distal pulses. Tachycardia present.  Pulmonary/Chest: She is not intubated. She has decreased breath sounds in the right upper field, the right middle field and the right lower field. She has wheezes.  Respiratory effort was increased while leaning forward. Able to speak in complete sentences otherwise.  Mild bilateral wheezing heard on ausculation.  Abdominal: Soft. Bowel sounds are normal. There is no abdominal tenderness.  Musculoskeletal: Normal range of motion.        General: No tenderness, deformity or edema.  Neurological: She is alert. She has normal strength. No cranial nerve deficit. She exhibits normal muscle tone.  Alert and answers questions appropriately.  Skin: Skin is warm and dry. Nails show no clubbing.  Psychiatric: She has a normal mood and affect. Her behavior is normal. Thought content normal.    Assessment & Plan by Problem: Kristen Chavez is a 68 y.o. female with history of HTN, OA, and depression who presents with chest pain, dyspnea, fatigue and CT findings of a right upper lobe malignancy.   Active Problems:   Mass of upper lobe of right lung  # Lung mass in right upper lobe: Patient presenting with chest pain, shortness of breath, significant smoking history, no apparent occupational exposures.  CT scan shows right upper lobe mass, extensive adenopathy, multifocal pleural metastatic disease, as  well as a large right pleural effusion.  Findings consistent with malignant mass.  No evidence for superimposed pneumonia on scan, WBC 8.5, patient afebrile.  Patient is aware of this likely diagnosis. * Cancer staging: CT head with and without contrast, CT abdomen pelvis with contrast * Will consult oncology when biopsy results * Thoracentesis performed per pulm removing 1.2 L bloody pleural fluid, sent for analysis - LDH, protein, cytology, cell count, culture, AFB   * Pulmonology to evaluate for mass biopsy * Zofran PRN for nausea, QTc 427 on EKG yesterday PM  # HTN: Blood pressure stable with systolic of 098/11. Will continue home medication of lisinopril 40 mg QD.   # Anxiety: Bupropion 300 mg QD, citalopram 20 mg QD  # Alcohol use disorder: Patient reports drinking greater than 5 drinks per day * Multivitamin QD + Folic acid 1 mg QD + Thiamine 100 mg QD * CIWA protocol  # Constipation: Miralax 17 g QD + Senna 2 tablets BID   Dispo: Admit patient to Inpatient with expected length of stay greater than 2 midnights.  Signed: [name], Medical Student 03/27/2019, 1:52 PM  Pager: [phone]  Attestation for Student Documentation:  I personally was present and performed or re-performed the history, physical exam and medical decision-making activities of this service and have verified that the service and findings are accurately documented in the student's note.  Jeanmarie Hubert, MD 03/27/2019, 3:48 PM Pager: [phone]

## 2019-03-27 NOTE — Consult Note (Addendum)
NAME:  Kristen Chavez, MRN:  [id number], DOB:  1950/11/09, LOS: 0 ADMISSION DATE:  03/26/2019, CONSULTATION DATE:  03/27/2019 REFERRING MD:  Dr. Fredric Dine, CHIEF COMPLAINT:  Lung Mass   Brief History   68 year old female admitted 9/16 with CT chest findings concerning for malignancy.   History of present illness   68 year old female (former 2ppd smoker) with PMH as below, which is significant for HTN. No history of TB or cancer. Had pneumonia once as a child. Recently started on methotrexate for uviitis. She presented to [facility] ED 9/16 with complaints of cough, right sided chest pain, weight loss, and decreased appetite x a few months. CXR initially demonstrated pleural effusion. CT chest was done to better characterize effusion and demonstrated RUL mass with extensive adenopathy and multifocal pleural metastatic disease including effusion. The patient was admitted to the IMTS with consultation from oncology. Pulmonary was consulted for thoracentesis.  Past Medical History   has a past medical history of Anxiety, Arthritis, Depression, and Hypertension.   Significant Hospital Events   9/16 admit  Consults:  9/16 Oncology > re-consult after biopsy results 9/16 IR 9/16 Pulmonology  Procedures:  Thoracentesis 9/16 >>>  Significant Diagnostic Tests:  CXR 9/16 > Suspicious for right-sided pulmonary neoplasm with pleural base metastatic disease and mediastinal adenopathy. Large right pleural effusion. CT chest 9/16 > Findings of right upper lobe malignancy with extensive adenopathy and multifocal pleural metastatic disease on the right. The right pleural effusion is large volume. CT head 9/16 >>> CT abdomen/pelvis 9/16 >>>  Micro Data:    Antimicrobials:     Interim history/subjective:    Objective   Blood pressure (!) 127/92, pulse 97, temperature 98.3 F (36.8 C), temperature source Oral, resp. rate 18, height 5\' 4"  (1.626 m), weight 59 kg, SpO2 96 %.         Intake/Output Summary (Last 24 hours) at 03/27/2019 1341 Last data filed at 03/27/2019 1011 Gross per 24 hour  Intake 500 ml  Output -  Net 500 ml   Filed Weights   03/26/19 2224  Weight: 59 kg    Examination: General: Elderly appearing female in NAD HENT: [place]/AT, PERRL, no JVD Lungs: Diminished on R Cardiovascular: RRR, no MRG Abdomen: Soft, non-tender, non-focal Extremities: No acute deformity or ROM limitation Neuro: Alert, oriented, non-focal.  Resolved Hospital Problem list     Assessment & Plan:    Large R pleural effusion Lung Mass: RUL mass with widespread adenopathy Plan: - Diagnostic/therapuetic thoracentesis today - Send fluid for cell count, gram stain, glucose, cytology, protein, LDH, Culture, AFB, Fungus culture. - Supplemental O2 to keep SpO2 > 92%  HTN - per primary  Depression Anxiety - per primary  Chronic methotrexate therapy (uviitis)  - per primary  Best practice:  Diet: per primary Pain/Anxiety/Delirium protocol (if indicated): NA VAP protocol (if indicated): NA DVT prophylaxis: lovenox GI prophylaxis: NA Glucose control: NA Mobility: up ad lib Code Status: FULL Family Communication: patient updated Disposition: med-surg  Labs   CBC: Recent Labs  Lab 03/26/19 2230  WBC 8.5  NEUTROABS 5.9  HGB 11.9*  HCT 36.0  MCV 81.6  PLT 435*    Basic Metabolic Panel: Recent Labs  Lab 03/26/19 2230  NA 137  K 3.8  CL 104  CO2 23  GLUCOSE 123*  BUN 16  CREATININE 1.04*  CALCIUM 9.2   GFR: Estimated Creatinine Clearance: 43.5 mL/min (A) (by C-G formula based on SCr of 1.04 mg/dL (H)).  Recent Labs  Lab 03/26/19 2230  WBC 8.5    Liver Function Tests: Recent Labs  Lab 03/26/19 2230 03/27/19 1241  AST 21  --   ALT 20  --   ALKPHOS 85  --   BILITOT 0.4  --   PROT 7.3 7.0  ALBUMIN 3.3*  --    No results for input(s): LIPASE, AMYLASE in the last 168 hours. No results for input(s): AMMONIA in the last 168 hours.  ABG  No results found for: PHART, PCO2ART, PO2ART, HCO3, TCO2, ACIDBASEDEF, O2SAT   Coagulation Profile No results for input(s): INR, PROTIME in the last 168 hours.  Cardiac Enzymes: No results for input(s): CKTOTAL, CKMB, CKMBINDEX, TROPONINI in the last 168 hours.  HbA1C: Hemoglobin A1C  Date/Time Value Ref Range Status  02/10/2017 11:28 AM 5.4  Final  03/24/2016 12:26 PM 5.4  Final    CBG: No results for input(s): GLUCAP in the last 168 hours.  Review of Systems:     Past Medical History  She,  has a past medical history of Anxiety, Arthritis, Depression, and Hypertension.   Surgical History    Past Surgical History:  Procedure Laterality Date  . TONSILLECTOMY       Social History   reports that she quit smoking about 4 years ago. Her smoking use included cigarettes. She smoked 0.25 packs per day. She has never used smokeless tobacco. She reports current alcohol use of about 21.0 standard drinks of alcohol per week. She reports current drug use. Drug: Marijuana.   Family History   Her family history includes Diabetes in her paternal grandfather and paternal grandmother; Hypertension in her mother; Other in her father; Rheum arthritis in her mother.   Allergies No Known Allergies   Home Medications  Prior to Admission medications   Medication Sig Start Date End Date Taking? Authorizing Provider  acetaminophen (TYLENOL) 500 MG tablet Take 1 tablet (500 mg total) by mouth every 6 (six) hours as needed for moderate pain or headache. 08/18/17  Yes Hairston, Mandesia R, FNP  buPROPion (WELLBUTRIN XL) 300 MG 24 hr tablet TAKE 1 TABLET (300 MG TOTAL) BY MOUTH DAILY. 03/06/19   [name], MD  cetirizine (ZYRTEC) 10 MG tablet Take 1 tablet (10 mg total) by mouth daily. 01/07/19   Charlott Rakes, MD  citalopram (CELEXA) 40 MG tablet Take 1 tablet (40 mg total) by mouth daily. 02/21/19   Argentina Donovan, PA-C  Flavoring Agent (GRAPEFRUIT FLAVOR) OIL Apply 1 application  topically daily as needed (knee pain). White grapefruit oil    [provider]  fluticasone (FLONASE) 50 MCG/ACT nasal spray Place 2 sprays into both nostrils daily. 01/07/19   Charlott Rakes, MD  hydrochlorothiazide (HYDRODIURIL) 25 MG tablet TAKE 1 TABLET BY MOUTH DAILY. 02/22/19   [name], MD  Lavender Oil OIL Apply 1 application topically 2 (two) times daily as needed (knee pain).    [provider]  lisinopril (ZESTRIL) 40 MG tablet TAKE 1 TABLET BY MOUTH DAILY. 02/22/19   [name], MD  methotrexate (RHEUMATREX) 2.5 MG tablet Take 2.5 mg by mouth once a week. Caution:Chemotherapy. Protect from light.    [provider]  Multiple Vitamin (MULTIVITAMIN WITH MINERALS) TABS tablet Take 1 tablet by mouth daily.    [provider]  traZODone (DESYREL) 50 MG tablet Take 0.5-1 tablets (25-50 mg total) by mouth at bedtime as needed for sleep. 02/21/19   Argentina Donovan, PA-C     Critical  care time:      Georgann Housekeeper, [facility] [place] Pager [phone] or [phone]  03/27/2019 1:58 PM

## 2019-03-28 ENCOUNTER — Inpatient Hospital Stay (HOSPITAL_COMMUNITY): Payer: Medicare Other

## 2019-03-28 DIAGNOSIS — F419 Anxiety disorder, unspecified: Secondary | ICD-10-CM

## 2019-03-28 DIAGNOSIS — Z7289 Other problems related to lifestyle: Secondary | ICD-10-CM

## 2019-03-28 DIAGNOSIS — J910 Malignant pleural effusion: Secondary | ICD-10-CM

## 2019-03-28 DIAGNOSIS — K5900 Constipation, unspecified: Secondary | ICD-10-CM

## 2019-03-28 DIAGNOSIS — Z79899 Other long term (current) drug therapy: Secondary | ICD-10-CM

## 2019-03-28 DIAGNOSIS — I10 Essential (primary) hypertension: Secondary | ICD-10-CM

## 2019-03-28 DIAGNOSIS — M1990 Unspecified osteoarthritis, unspecified site: Secondary | ICD-10-CM

## 2019-03-28 DIAGNOSIS — Z9889 Other specified postprocedural states: Secondary | ICD-10-CM

## 2019-03-28 DIAGNOSIS — F329 Major depressive disorder, single episode, unspecified: Secondary | ICD-10-CM

## 2019-03-28 DIAGNOSIS — C801 Malignant (primary) neoplasm, unspecified: Secondary | ICD-10-CM

## 2019-03-28 LAB — URINALYSIS, ROUTINE W REFLEX MICROSCOPIC
Bacteria, UA: NONE SEEN
Bilirubin Urine: NEGATIVE
Glucose, UA: NEGATIVE mg/dL
Hgb urine dipstick: NEGATIVE
Ketones, ur: NEGATIVE mg/dL
Nitrite: NEGATIVE
Protein, ur: NEGATIVE mg/dL
Specific Gravity, Urine: 1.039 — ABNORMAL HIGH (ref 1.005–1.030)
pH: 6 (ref 5.0–8.0)

## 2019-03-28 LAB — CBC
HCT: 28.8 % — ABNORMAL LOW (ref 36.0–46.0)
Hemoglobin: 9.6 g/dL — ABNORMAL LOW (ref 12.0–15.0)
MCH: 26.6 pg (ref 26.0–34.0)
MCHC: 33.3 g/dL (ref 30.0–36.0)
MCV: 79.8 fL — ABNORMAL LOW (ref 80.0–100.0)
Platelets: 368 10*3/uL (ref 150–400)
RBC: 3.61 MIL/uL — ABNORMAL LOW (ref 3.87–5.11)
RDW: 14.5 % (ref 11.5–15.5)
WBC: 8 10*3/uL (ref 4.0–10.5)
nRBC: 0 % (ref 0.0–0.2)

## 2019-03-28 LAB — ACID FAST SMEAR (AFB, MYCOBACTERIA): Acid Fast Smear: NEGATIVE

## 2019-03-28 LAB — BASIC METABOLIC PANEL
Anion gap: 11 (ref 5–15)
BUN: 13 mg/dL (ref 8–23)
CO2: 21 mmol/L — ABNORMAL LOW (ref 22–32)
Calcium: 8.5 mg/dL — ABNORMAL LOW (ref 8.9–10.3)
Chloride: 104 mmol/L (ref 98–111)
Creatinine, Ser: 0.84 mg/dL (ref 0.44–1.00)
GFR calc Af Amer: >60 mL/min (ref >60)
GFR calc non Af Amer: >60 mL/min (ref >60)
Glucose, Bld: 96 mg/dL (ref 70–99)
Potassium: 3.7 mmol/L (ref 3.5–5.1)
Sodium: 136 mmol/L (ref 135–145)

## 2019-03-28 LAB — CYTOLOGY - NON PAP

## 2019-03-28 LAB — HIV ANTIBODY (ROUTINE TESTING W REFLEX): HIV Screen 4th Generation wRfx: NONREACTIVE

## 2019-03-28 MED ORDER — IOHEXOL 300 MG/ML  SOLN
100.0000 mL | Freq: Once | INTRAMUSCULAR | Status: AC | PRN
  Administered 2019-03-28: 100 mL via INTRAVENOUS

## 2019-03-28 NOTE — Progress Notes (Signed)
Patient given ativan per CIWA protocol no acute distress.

## 2019-03-28 NOTE — Progress Notes (Signed)
OT Cancellation Note  Patient Details Name: Kristen Chavez MRN: [id number] DOB: 1951/01/15   Cancelled Treatment:    Reason Eval/Treat Not Completed: Patient at procedure or test/ unavailable(CT) OT will check back for evaluation as schedule allows  Jaci Carrel 03/28/2019, 12:09 PM   Hulda Humphrey OTR/L [place] Pager: [phone] Office: [phone]

## 2019-03-28 NOTE — Progress Notes (Signed)
Subjective: Spoke to patient during morning rounds. Patient reports no pain in chest or abdomen. States that the only pain she has is soreness in the thoracentesis entry point. Patient reports that her breathing has improved after thoracentesis however is not yet back to baseline. Patient reports that she is able to go to the bathroom normally.   Objective:  Vital signs in last 24 hours: Vitals:   03/28/19 0032 03/28/19 0458 03/28/19 0502 03/28/19 0857  BP: 99/72 97/69  113/76  Pulse: 89 94  (!) 42  Resp: '20 20  18  '$ Temp: 98.2 F (36.8 C) 98.4 F (36.9 C)  97.7 F (36.5 C)  TempSrc: Oral Oral  Oral  SpO2: 95% 91%  97%  Weight:   63.5 kg   Height:       Weight change: 3.81 kg  Intake/Output Summary (Last 24 hours) at 03/28/2019 1359 Last data filed at 03/28/2019 1200 Gross per 24 hour  Intake 1713.19 ml  Output -  Net 1713.19 ml   Physical Exam  Constitutional: She appears well-developed and well-nourished.  HENT:  Head: Normocephalic and atraumatic.  Eyes: EOM are normal.  Neck: Normal range of motion. Neck supple.  Cardiovascular: Normal rate, regular rhythm, normal heart sounds and intact distal pulses.  Pulmonary/Chest: Effort normal. She has decreased breath sounds in the right upper field, the right middle field and the right lower field. She has wheezes.  Abdominal: Soft. Bowel sounds are normal. There is no abdominal tenderness. There is no rebound and no guarding.  Musculoskeletal: Normal range of motion.        General: No edema.  Neurological: She is alert.  Answers questions appropriately.   Skin: Skin is warm, dry and intact. She is not diaphoretic. Nails show no clubbing.  Psychiatric: She has a normal mood and affect. Her speech is normal and behavior is normal.   Assessment/Plan: Kristen Chavez is a 68 y.o. female with history of HTN, OA, and depression who presented yesterday with chest pain, dyspnea, fatigue and findings suggestive of a right upper  lobe malignancy and bladder malignancy.   Active Problems:   Mass of upper lobe of right lung # Lung mass in right upper lobe: Pulmonary performed thoracentesis and removed 1200 mL of bloody pleural fluid and was sent for analysis of LDH, protein, cytology, cell count, culture, and AFB. LDH and protein levels both met Light's Criteria for exudative effusion. 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 Cell count showed slightly elevated neutrophils at 30%, and decreased monocyte/macrophages at 36%. Culture showed few wbc present and no organisms, ruling out infection. AFB is pending. CT abdomen pelvis showed enhancing mucosal soft tissue mass is seen in the urinary bladder along the right posterior wall measuring 2.0 x 1.9 cm. This is highly suspicious for primary bladder carcinoma. Patient is not yet aware of mass in bladder. CT Head showed non-concerning findings.  * Urinalysis to evaluate for RBC.  * Will consult oncology when biopsy results * Pulmonology to evaluate for mass biopsy * Zofran PRN for nausea, QTc 427 on EKG yesterday PM  # HTN: Blood pressure stable with systolic of 492/01.  *Continue lisinopril 40 mg QD.   # Anxiety:  *Continue: Bupropion 300 mg QD, citalopram 20 mg QD  # Alcohol use disorder: Patient reports drinking greater than 5 drinks per day. Patient has states that she feels well and has no new symptoms this morning.  * Continue: Multivitamin QD + Folic acid 1 mg QD +  Thiamine 100 mg QD * CIWA protocol  # Constipation: Patient reports that she has been able to go to the bathroom normally.  *Continue: Miralax 17 g QD + Senna 2 tablets BID   LOS: 1 day   [name], Medical Student 03/28/2019, 1:59 PM

## 2019-03-28 NOTE — Progress Notes (Signed)
Initial Nutrition Assessment  RD working remotely.  DOCUMENTATION CODES:   Not applicable  INTERVENTION:   -MVI with minerals daily -Continue Ensure Enlive po BID, each supplement provides 350 kcal and 20 grams of protein  NUTRITION DIAGNOSIS:   Increased nutrient needs related to acute illness(rt lung mass concerning for malignancy) as evidenced by estimated needs.  GOAL:   Patient will meet greater than or equal to 90% of their needs  MONITOR:   PO intake, Supplement acceptance, Labs, Weight trends, Skin, I & O's  REASON FOR ASSESSMENT:   Consult, Malnutrition Screening Tool Assessment of nutrition requirement/status  ASSESSMENT:   Kristen Chavez is a 68 y.o. female with history of HTN, OA, and depression who presents with chest pain, dyspnea, fatigue and CT findings of a right upper lobe malignancy.  Pt admitted with rt upper lobe lung mass with possible malignancy.   9/16- rt thoracentesis (1200 ml fluid yielded)  Attempted to speak with pt via phone, however, no answer. Unable to obtain further nutrition-related history from pt at this time.    Pt currently on a heart healthy diet, however, no meal completion data to assess at this time.  Reviewed wt hx; pt with mild distant history of weight loss. Noted pt has experienced a 2.6% wt loss over the past 3 months, which is not significant for time frame.   Given increased nutrient needs for possible malignancy, pt would benefit from addition of oral nutrition supplements to assist in meeting nutrition goals.   Medications reviewed and include lactated ringers infusion @ 100 ml/hr, senna, miralax, folvite, and MVI.   Labs reviewed.   Diet Order:   Diet Order            Diet Heart Room service appropriate? Yes; Fluid consistency: Thin  Diet effective now              EDUCATION NEEDS:   No education needs have been identified at this time  Skin:  Skin Assessment: Reviewed RN Assessment  Last BM:   Unknown  Height:   Ht Readings from Last 1 Encounters:  03/27/19 5\' 3"  (1.6 m)    Weight:   Wt Readings from Last 1 Encounters:  03/28/19 63.5 kg    Ideal Body Weight:  52.3 kg  BMI:  Body mass index is 24.8 kg/m.  Estimated Nutritional Needs:   Kcal:  1900-2100  Protein:  95-110 grams  Fluid:  > 1.9 L    Kristen Chavez A. Jimmye Norman, RD, LDN, [place] Registered Dietitian II Certified Diabetes Care and Education Specialist Pager: [phone] After hours Pager: [phone]

## 2019-03-28 NOTE — Progress Notes (Signed)
   NAME:  Kristen Chavez, MRN:  [id number], DOB:  [birth year], LOS: 1 ADMISSION DATE:  03/26/2019, CONSULTATION DATE:  03/27/2019 REFERRING MD:  Dr. Fredric Dine, CHIEF COMPLAINT:  Lung Mass    History of present illness   68 year old female (former 2ppd smoker) with PMH as below, which is significant for HTN. No history of TB or cancer. Had pneumonia once as a child. Recently started on methotrexate for uviitis. She presented to [facility] ED 9/16 with complaints of cough, right sided chest pain, weight loss, and decreased appetite x a few months. CXR initially demonstrated pleural effusion. CT chest was done to better characterize effusion and demonstrated RUL mass with extensive adenopathy and multifocal pleural metastatic disease including effusion. The patient was admitted to the IMTS with consultation from oncology. Pulmonary was consulted for thoracentesis.  Past Medical History   has a past medical history of Anxiety, Arthritis, Depression, and Hypertension.   Significant Hospital Events   9/16 admit  Consults:  9/16 Oncology > re-consult after biopsy results 9/16 IR 9/16 Pulmonology  Procedures:  Thoracentesis 9/16 >>>  Significant Diagnostic Tests:  CXR 9/16 > Suspicious for right-sided pulmonary neoplasm with pleural base metastatic disease and mediastinal adenopathy. Large right pleural effusion. CT chest 9/16 > Findings of right upper lobe malignancy with extensive adenopathy and multifocal pleural metastatic disease on the right. The right pleural effusion is large volume. CT head 9/17 >>> CT abdomen/pelvis 9/17 >>> 2 cm mucosal soft tissue mass in the urinary bladder, highly suspicious for primary bladder carcinoma  Micro Data:    Antimicrobials:     Interim history/subjective:    Objective   Blood pressure 113/76, pulse (!) 42, temperature 97.7 F (36.5 C), temperature source Oral, resp. rate 18, height 5\' 3"  (1.6 m), weight 63.5 kg, SpO2 97 %.         Intake/Output Summary (Last 24 hours) at 03/28/2019 1320 Last data filed at 03/28/2019 1200 Gross per 24 hour  Intake 1713.19 ml  Output -  Net 1713.19 ml   Filed Weights   03/26/19 2224 03/27/19 2113 03/28/19 0502  Weight: 59 kg 62.8 kg 63.5 kg    Examination: General: Elderly appearing female in NAD HENT: [place]/AT, PERRL, no JVD Lungs: decreased RT  Cardiovascular: RRR, no MRG Abdomen: Soft, non-tender, non-focal Extremities: No acute deformity or ROM limitation Neuro: Alert, oriented, non-focal.  CXR 9/16 shows decreased effusion p -[facility] Problem list     Assessment & Plan:    Large R pleural effusion - Cytology shows adenocarcinoma, favor lung primary Lung Mass: RUL mass with widespread adenopathy   Plan: Oncology consult PET scan as outpt Discussed diagnosis with pt  PCCM available as needed  Kara Mead MD. FCCP. [place] [facility] Pager [phone] If no response call [phone]   03/28/2019

## 2019-03-28 NOTE — Progress Notes (Signed)
Oral contrast started.

## 2019-03-28 NOTE — Progress Notes (Signed)
  Date: 03/28/2019  Patient name: Kristen Chavez  Medical record number: [id number]  Date of birth: May 14, 1951   I have seen and evaluated [name] and discussed their care with the Residency Team.  In brief, patient is a 68 year old female with a past medical history of hypertension, OA and depression who presented to the ED with progressive shortness of breath and fatigue over the last few months.  Patient states that over the last few months she has noted progressively worsening shortness of breath and fatigue as well as intermittent right-sided chest pain which was pleuritic in nature with some associated wheezing.  Patient states that he also has had a cough over the last couple of months which has been occasionally productive of clear phlegm.  No hemoptysis noted.  Patient also complains of considerable weight loss over the last year but is unsure of how much.  She also states that her appetite is intact but only eats a few bites every meal as she feels too tired to eat.  No fevers or chills, no nausea or vomiting, no palpitations, no lightheadedness, no syncope, no focal weakness, tingling or numbness, no headache, no blurry vision.  Today patient states that her shortness of breath is mildly improved after her thoracentesis yesterday.  PMHx, Fam Hx, and/or Soc Hx : As per resident admit note  Vitals:   03/28/19 0857 03/28/19 1433  BP: 113/76 99/78  Pulse: (!) 42 (!) 108  Resp: 18 18  Temp: 97.7 F (36.5 C) 98.4 F (36.9 C)  SpO2: 97% 98%   General: Awake, alert, oriented x3, NAD CVS: Regular rate and rhythm, normal heart sounds Lungs: Decreased breath sounds over entire right lung field as well as scattered bilateral expiratory wheezes Abdomen: Soft, nontender, nondistended, normoactive bowel sounds Extremities: No tenderness to palpation, no edema noted Psych: Normal mood and affect Skin: Warm and dry, no rash noted Neuro: Awake, alert, oriented x3, no focal deficits noted  HEENT: Normocephalic, atraumatic  Assessment and Plan: I have seen and evaluated the patient as outlined above. I agree with the formulated Assessment and Plan as detailed in the residents' note, with the following changes:   1.  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  On imaging she was found to have a right upper lobe lung mass with extensive adenopathy and multifocal pleural metastatic disease as well as a large right-sided pleural effusion consistent with likely malignancy. -Patient is status post thoracentesis which showed an exudative effusion -Fluid cytology was positive for malignant cells likely adenocarcinoma -Pulmonology follow-up and recommendations appreciated -We will obtain oncology consult in a.m. -Patient will likely require a biopsy of her lung mass.  Will discuss options with oncology -CT head and CT abdomen pelvis were done which showed no evidence of metastatic disease.  However, patient was noted to have a 2 x 1.9 cm bladder mass along the right posterior wall suspicious for primary bladder carcinoma.  Would consider urology consult in a.m. patient will likely need cystoscopy as an outpatient.  We will follow-up UA -No further work-up at this time  Aldine Contes, MD 9/17/20202:44 PM

## 2019-03-28 NOTE — Progress Notes (Signed)
Marland Kitchen    HEMATOLOGY/ONCOLOGY CONSULTATION NOTE  Date of Service: 03/28/2019  Patient Care Team: [name], MD as PCP - General (Internal Medicine)  CHIEF COMPLAINTS/PURPOSE OF CONSULTATION:  Newly diagnosed metastatic lung adenocarcinoma   HISTORY OF PRESENTING ILLNESS:   Kristen Chavez is a wonderful 68 y.o. female who has been referred to [place] by Aldine Contes, MD for evaluation and management of newly diagnosed adenocarcinoma of the lung.   The pt reports that she presented to the hospital with progressively worsening shortness of breath and fatigue with weight loss over the last several months.   Of note prior to the patient's visit today, pt has had a chest xray completed on 03/26/2019 with results revealing "Changes suspicious for right-sided pulmonary neoplasm with pleural base metastatic disease and mediastinal adenopathy. Large right pleural effusion is noted with likely underlying atelectasis. CT of the chest is recommended with contrast for further evaluation.".   Pt had chest CT with contrast completed on 03/27/2019 with results revealing "Findings of right upper lobe malignancy with extensive adenopathy and multifocal pleural metastatic disease on the right. The right pleural effusion is large volume."  Pt had cytology ([id number]) completed on 03/27/2019 with results revealing malignant cells consistent with adenocarcinoma  Pt had a head CT with and without contrast completed on 03/28/2019 with results revealing "Unremarkable CT appearance of the brain. No evidence of intracranial metastatic disease."  Pt had CT abdomen and pelvis with contrast completed on 03/28/2019 with results revealing "No evidence of metastatic lung carcinoma within the abdomen or pelvis. 2 cm mucosal soft tissue mass in the urinary bladder, highly suspicious for primary bladder carcinoma. Recommend urology referral for cystoscopy. Malignant right pleural effusion and lymphadenopathy,  as better demonstrated on recent chest CT."  Most recent lab results (03/28/2019) of CBC is as follows: all values are WNL except for RBC at 3.61, HGB at 9.6, HCT at 28.8, MCV at 79.8.   MEDICAL HISTORY:  Past Medical History:  Diagnosis Date   Anxiety    Arthritis    osteoarthritis.   Depression    Hypertension     SURGICAL HISTORY: Past Surgical History:  Procedure Laterality Date   TONSILLECTOMY      SOCIAL HISTORY: Social History   Socioeconomic History   Marital status: Single    Spouse name: Not on file   Number of children: Not on file   Years of education: Not on file   Highest education level: Not on file  Occupational History   Not on file  Social Needs   Financial resource strain: Not on file   Food insecurity    Worry: Not on file    Inability: Not on file   Transportation needs    Medical: Not on file    Non-medical: Not on file  Tobacco Use   Smoking status: Former Smoker    Packs/day: 0.25    Types: Cigarettes    Quit date: 04/14/2014    Years since quitting: 4.9   Smokeless tobacco: Never Used  Substance and Sexual Activity   Alcohol use: Yes    Alcohol/week: 21.0 standard drinks    Types: 21 Glasses of wine per week    Comment: socially wine   Drug use: Yes    Types: Marijuana   Sexual activity: Never  Lifestyle   Physical activity    Days per week: Not on file    Minutes per session: Not on file   Stress: Not on file  Relationships  Social Herbalist on phone: Not on file    Gets together: Not on file    Attends religious service: Not on file    Active member of club or organization: Not on file    Attends meetings of clubs or organizations: Not on file    Relationship status: Not on file   Intimate partner violence    Fear of current or ex partner: Not on file    Emotionally abused: Not on file    Physically abused: Not on file    Forced sexual activity: Not on file  Other Topics Concern    Not on file  Social History Narrative   Not on file    FAMILY HISTORY: Family History  Problem Relation Age of Onset   Hypertension Mother    Rheum arthritis Mother    Other Father        suicide   Diabetes Paternal Grandmother    Diabetes Paternal Grandfather     ALLERGIES:  has No Known Allergies.  MEDICATIONS:  Current Facility-Administered Medications  Medication Dose Route Frequency Provider Last Rate Last Dose   acetaminophen (TYLENOL) tablet 650 mg  650 mg Oral Q6H PRN Welford Roche, MD   650 mg at 03/28/19 0507   Or   acetaminophen (TYLENOL) suppository 650 mg  650 mg Rectal Q6H PRN Welford Roche, MD       buPROPion (WELLBUTRIN XL) 24 hr tablet 300 mg  300 mg Oral Daily Santos-Sanchez, Idalys, MD   300 mg at 03/28/19 1413   citalopram (CELEXA) tablet 20 mg  20 mg Oral Daily Santos-Sanchez, Idalys, MD   20 mg at 03/28/19 1413   enoxaparin (LOVENOX) injection 40 mg  40 mg Subcutaneous Q24H Santos-Sanchez, Merlene Morse, MD   40 mg at 03/28/19 1814   feeding supplement (ENSURE ENLIVE) (ENSURE ENLIVE) liquid 237 mL  237 mL Oral BID BM Dareen Piano, Nischal, MD   237 mL at 91/79/15 0569   folic acid (FOLVITE) tablet 1 mg  1 mg Oral Daily Santos-Sanchez, Idalys, MD   1 mg at 03/28/19 1412   lisinopril (ZESTRIL) tablet 40 mg  40 mg Oral Daily Santos-Sanchez, Merlene Morse, MD   40 mg at 03/28/19 1413   LORazepam (ATIVAN) injection 1-4 mg  1-4 mg Intravenous Q1H PRN Welford Roche, MD   2 mg at 03/28/19 1413   multivitamin with minerals tablet 1 tablet  1 tablet Oral Daily Santos-Sanchez, Merlene Morse, MD   1 tablet at 03/28/19 1412   ondansetron (ZOFRAN) tablet 4 mg  4 mg Oral Q6H PRN Welford Roche, MD       Or   ondansetron (ZOFRAN) injection 4 mg  4 mg Intravenous Q6H PRN Santos-Sanchez, Idalys, MD       polyethylene glycol (MIRALAX / GLYCOLAX) packet 17 g  17 g Oral Daily Santos-Sanchez, Merlene Morse, MD   17 g at 03/28/19 1419   senna-docusate  (Senokot-S) tablet 2 tablet  2 tablet Oral BID Welford Roche, MD   2 tablet at 03/27/19 2135   sodium chloride flush (NS) 0.9 % injection 3 mL  3 mL Intravenous Q12H Welford Roche, MD   3 mL at 03/27/19 2136   thiamine (VITAMIN B-1) tablet 100 mg  100 mg Oral Daily Welford Roche, MD   100 mg at 03/28/19 1412    REVIEW OF SYSTEMS:    10 Point review of Systems was done is negative except as noted above.  PHYSICAL EXAMINATION: ECOG PERFORMANCE STATUS: 1 - Symptomatic but completely  ambulatory  . Vitals:   03/28/19 1433 03/28/19 1855  BP: 99/78 96/79  Pulse: (!) 108 (!) 124  Resp: 18   Temp: 98.4 F (36.9 C) 98.7 F (37.1 C)  SpO2: 98% 93%   Filed Weights   03/26/19 2224 03/27/19 2113 03/28/19 0502  Weight: 130 lb (59 kg) 138 lb 6.4 oz (62.8 kg) 140 lb (63.5 kg)   .Body mass index is 24.8 kg/m.  GENERAL:alert, in no acute distress and comfortable SKIN: no acute rashes, no significant lesions EYES: conjunctiva are pink and non-injected, sclera anicteric OROPHARYNX: MMM, no exudates, no oropharyngeal erythema or ulceration NECK: supple, no JVD LYMPH:  no palpable lymphadenopathy in the cervical, axillary or inguinal regions LUNGS:decreased breath sounds rt lung base HEART: regular rate & rhythm ABDOMEN:  normoactive bowel sounds , non tender, not distended. Extremity: no pedal edema PSYCH: alert & oriented x 3 with fluent speech NEURO: no focal motor/sensory deficits  LABORATORY DATA:  I have reviewed the data as listed  . CBC Latest Ref Rng & Units 03/28/2019 03/26/2019 10/28/2017  WBC 4.0 - 10.5 K/uL 8.0 8.5 5.8  Hemoglobin 12.0 - 15.0 g/dL 9.6(L) 11.9(L) 13.0  Hematocrit 36.0 - 46.0 % 28.8(L) 36.0 38.6  Platelets 150 - 400 K/uL 368 435(H) 299    . CMP Latest Ref Rng & Units 03/28/2019 03/27/2019 03/26/2019  Glucose 70 - 99 mg/dL 96 - 123(H)  BUN 8 - 23 mg/dL 13 - 16  Creatinine 0.44 - 1.00 mg/dL 0.84 - 1.04(H)  Sodium 135 - 145 mmol/L  136 - 137  Potassium 3.5 - 5.1 mmol/L 3.7 - 3.8  Chloride 98 - 111 mmol/L 104 - 104  CO2 22 - 32 mmol/L 21(L) - 23  Calcium 8.9 - 10.3 mg/dL 8.5(L) - 9.2  Total Protein 6.5 - 8.1 g/dL - 7.0 7.3  Total Bilirubin 0.3 - 1.2 mg/dL - - 0.4  Alkaline Phos 38 - 126 U/L - - 85  AST 15 - 41 U/L - - 21  ALT 0 - 44 U/L - - 20       RADIOGRAPHIC STUDIES: I have personally reviewed the radiological images as listed and agreed with the findings in the report. Dg Chest 2 View  Result Date: 03/26/2019 CLINICAL DATA:  Right-sided chest pain and shortness of breath EXAM: CHEST - 2 VIEW COMPARISON:  10/28/2017 FINDINGS: Cardiac shadow is within normal limits. Right-sided pleural effusion and basilar consolidation is noted. Significant increased density is noted in the right paratracheal region suspicious for lymphadenopathy. Changes are suspicious for underlying neoplasm. Pleural based mass lesions in the right apex are noted as well. IMPRESSION: Changes suspicious for right-sided pulmonary neoplasm with pleural base metastatic disease and mediastinal adenopathy. Large right pleural effusion is noted with likely underlying atelectasis. CT of the chest is recommended with contrast for further evaluation. Electronically Signed   By: Inez Catalina M.D.   On: 03/26/2019 23:03   Ct Head W & Wo Contrast  Result Date: 03/28/2019 CLINICAL DATA:  Lung mass. Rule out intracranial metastases. EXAM: CT HEAD WITHOUT AND WITH CONTRAST TECHNIQUE: Contiguous axial images were obtained from the base of the skull through the vertex without and with intravenous contrast CONTRAST:  172m OMNIPAQUE IOHEXOL 300 MG/ML  SOLN COMPARISON:  None. FINDINGS: Brain: There is no evidence of acute infarct, intracranial hemorrhage, mass, midline shift, or extra-axial fluid collection. The ventricles and sulci are normal. No abnormal enhancement is identified. Vascular: Calcified atherosclerosis at the skull base. Major dural venous sinuses  and  large arteries at the base of the brain are patent. Skull: No fracture or focal osseous lesion. Sinuses/Orbits: Visualized paranasal sinuses and mastoid air cells are clear. Left cataract extraction is noted. Other: None. IMPRESSION: Unremarkable CT appearance of the brain. No evidence of intracranial metastatic disease. Electronically Signed   By: Logan Bores M.D.   On: 03/28/2019 13:37   Ct Chest W Contrast  Result Date: 03/27/2019 CLINICAL DATA:  Pleural effusion.  Increasing shortness of breath EXAM: CT CHEST WITH CONTRAST TECHNIQUE: Multidetector CT imaging of the chest was performed during intravenous contrast administration. CONTRAST:  13m OMNIPAQUE IOHEXOL 300 MG/ML  SOLN COMPARISON:  Chest x-ray from earlier today FINDINGS: Cardiovascular: No cardiomegaly. No pericardial effusion. Malignant distortion/narrowing of right-sided pulmonary arteries. No acute vascular finding. Mediastinum/Nodes: Right upper lobe mass encasing the central vessels with heterogeneous central enhancement attributed to necrosis. Thoracic adenopathy at the right hilum, subcarinal station, paratracheal station, and AP window, and left pulmonary hilum. Confluent right hilar nodal mass measures up to 4.3 cm on axial slices. Lungs/Pleura: Right upper lobe mass which is branching and blends with the hilar mass, limiting reproducible measurement. There is a large right pleural effusion with multifocal pleural nodularity. No left lung metastasis. No suspected superimposed edema or pneumonia. Upper Abdomen: No acute finding or evident metastasis. Musculoskeletal: Advanced thoracic spine degeneration with multilevel osteitis. No acute or aggressive finding. 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 The right pleural effusion is large volume. Electronically Signed   By: JMonte FantasiaM.D.   On: 03/27/2019 09:37   Ct Abdomen Pelvis W  Contrast  Result Date: 03/28/2019 CLINICAL DATA:  Newly diagnosed right lung carcinoma. Staging. EXAM: CT ABDOMEN AND PELVIS WITH CONTRAST TECHNIQUE: Multidetector CT imaging of the abdomen and pelvis was performed using the standard protocol following bolus administration of intravenous contrast. CONTRAST:  1051mOMNIPAQUE IOHEXOL 300 MG/ML  SOLN COMPARISON:  Chest CT on 03/27/2019 FINDINGS: Lower Chest: Right pleural effusion with enhancing pleural soft tissue density and bilateral hilar lymphadenopathy, as demonstrated on recent chest CT. Hepatobiliary: No hepatic masses identified. Gallbladder is unremarkable. No evidence of biliary ductal dilatation. Pancreas:  No mass or inflammatory changes. Spleen: Within normal limits in size and appearance. Adrenals/Urinary Tract: No adrenal or renal masses identified. No evidence of hydronephrosis. Enhancing mucosal soft tissue mass is seen in the urinary bladder along the right posterior wall measuring 2.0 x 1.9 cm. This is highly suspicious for primary bladder carcinoma. Stomach/Bowel: No evidence of obstruction, inflammatory process or abnormal fluid collections. Vascular/Lymphatic: No pathologically enlarged lymph nodes. No abdominal aortic aneurysm. Aortic atherosclerosis. Reproductive:  No mass or other significant abnormality. Other:  None. Musculoskeletal: No suspicious bone lesions identified. Severe lumbar spine degenerative disc disease noted. IMPRESSION: No evidence of metastatic lung carcinoma within the abdomen or pelvis. 2 cm mucosal soft tissue mass in the urinary bladder, highly suspicious for primary bladder carcinoma. Recommend urology referral for cystoscopy. Malignant right pleural effusion and lymphadenopathy, as better demonstrated on recent chest CT. Electronically Signed   By: JoMarlaine Hind.D.   On: 03/28/2019 12:07   Dg Chest Port 1 View  Result Date: 03/27/2019 CLINICAL DATA:  Status post right-sided thoracentesis EXAM: PORTABLE CHEST 1  VIEW COMPARISON:  03/26/2019 FINDINGS: Heart size is within normal limits. Unchanged appearance of large irregular right hilar mass. Multiple right-sided pleural based masses. Interval decrease in right-sided pleural effusion, trace residual pleural fluid remaining. No  pneumothorax. Left lung is clear. IMPRESSION: 1. Interval decrease in a right sided pleural effusion. No pneumothorax. 2. Additional findings are unchanged from prior. Electronically Signed   By: Davina Poke M.D.   On: 03/27/2019 15:59   ASSESSMENT & PLAN:  Kristen Chavez is a 68 y.o. female with:  1. 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 Malignant pleural effusion 3. 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 Extensive smoking history. PLAN: -Discussed patient's most recent labs from 03/28/2019, all values are WNL except for RBC at 3.61, HGB at 9.6, HCT at 28.8, MCV at 79.8. -Discussed 03/26/2019 chest xray with results revealing "Changes suspicious for right-sided pulmonary neoplasm with pleural base metastatic disease and mediastinal adenopathy. Large right pleural effusion is noted with likely underlying atelectasis. CT of the chest is recommended with contrast for further evaluation.".  -Discussed 03/27/2019 chest CT with results revealing "Findings of right upper lobe malignancy with extensive adenopathy and multifocal pleural metastatic disease on the right. The right pleural effusion is large volume." -Discussed 03/27/2019 cytology ([id number]) with results revealing malignant cells consistent with adenocarcinoma -Discussed 03/28/2019 head CT with results revealing "Unremarkable CT appearance of the brain. No evidence of intracranial metastatic disease." -Discussed 03/28/2019 CT abdomen and pelvis with contrast with results revealing "No evidence of metastatic lung carcinoma within the abdomen or pelvis. 2 cm mucosal soft tissue mass in the urinary bladder,  highly suspicious for primary bladder carcinoma. Recommend urology referral for cystoscopy. Malignant right pleural effusion and lymphadenopathy, as better demonstrated on recent chest CT." -will need urology consultation for evaluation of bladder tumor -will need to confirm with pathology if adequate cell block is available for PDL1 and foundation one tesitng. If not might need additional biopsy for testing -discussed that given her malignant pleural effusion she have metastatic non curable disease.but can be treated to control with palliative intent. -patient appreciative of all the help. -will setup out patient medical oncology followup with Dr Irene Limbo in 2 weeks  All of the patients questions were answered with apparent satisfaction. The patient knows to call the clinic with any problems, questions or concerns.  I spent 60 minutes counseling the patient face to face. The total time spent in the appointment was 80 minutes and more than 50% was on counseling and direct patient cares.    Sullivan Lone MD [place] AAHIVMS [facility] Hematology/Oncology Physician [facility]  (Office):       [phone] (Work cell):  [phone] (Fax):           [phone]  03/28/2019 7:25 PM

## 2019-03-28 NOTE — Progress Notes (Signed)
PT Cancellation Note  Patient Details Name: Kristen Chavez MRN: [id number] DOB: Jul 29, 1950   Cancelled Treatment:    Reason Eval/Treat Not Completed: Patient at procedure or test/unavailable(pt currently leaving room for test)   Emelin Dascenzo B Aya Geisel 03/28/2019, 10:45 AM Elwyn Reach, PT Acute Rehabilitation Services Pager: [phone] Office: [phone]

## 2019-03-29 DIAGNOSIS — N329 Bladder disorder, unspecified: Secondary | ICD-10-CM

## 2019-03-29 DIAGNOSIS — C801 Malignant (primary) neoplasm, unspecified: Secondary | ICD-10-CM

## 2019-03-29 DIAGNOSIS — D649 Anemia, unspecified: Secondary | ICD-10-CM

## 2019-03-29 LAB — CBC
HCT: 31.9 % — ABNORMAL LOW (ref 36.0–46.0)
Hemoglobin: 10 g/dL — ABNORMAL LOW (ref 12.0–15.0)
MCH: 25.4 pg — ABNORMAL LOW (ref 26.0–34.0)
MCHC: 31.3 g/dL (ref 30.0–36.0)
MCV: 81.2 fL (ref 80.0–100.0)
Platelets: 436 10*3/uL — ABNORMAL HIGH (ref 150–400)
RBC: 3.93 MIL/uL (ref 3.87–5.11)
RDW: 14.6 % (ref 11.5–15.5)
WBC: 8.1 10*3/uL (ref 4.0–10.5)
nRBC: 0 % (ref 0.0–0.2)

## 2019-03-29 NOTE — TOC Initial Note (Signed)
Transition of Care [facility]) - Initial/Assessment Note    Patient Details  Name: Kristen Chavez MRN: [id number] Date of Birth: Nov 22, 1950  Transition of Care [facility]) CM/SW Contact:    Alberteen Sam,  Phone Number: [phone] 03/29/2019, 2:33 PM  Clinical Narrative:                 CSW met with patient regarding discharge planning, she is acceptable to home health PT and OT with no preference of agency. She reports also needing a walker at time of discharge and reports family will pick her up when she leaves the hospital.   CSW has set patient up with [facility] PT and OT, walker to be delivered to patient's room via [place] with [employer].   No further needs identified at this time.   Expected Discharge Plan: [place] Barriers to Discharge: Continued Medical Work up   Patient Goals and CMS Choice Patient states their goals for this hospitalization and ongoing recovery are:: to go home CMS Medicare.gov Compare Post Acute Care list provided to:: Patient Choice offered to / list presented to : Patient  Expected Discharge Plan and Services Expected Discharge Plan: [place]       Living arrangements for the past 2 months: Single Family Home                 DME Arranged: Walker rolling DME Agency: [employer] Date DME Agency Contacted: 03/29/19 Time DME Agency Contacted: [id number] Representative spoke with at DME Agency: [place] Arranged: OT, PT [place] Agency: [place] Date [place]: 03/29/19 Time [place]: 28 Representative spoke with at [place] Arrangements/Services Living arrangements for the past 2 months: [place] with:: Self Patient language and need for interpreter reviewed:: Yes Do you feel safe going back to the place where you live?: Yes      Need for Family Participation in Patient Care: Yes (Comment) Care giver support system in place?: Yes  (comment)   Criminal Activity/Legal Involvement Pertinent to Current Situation/Hospitalization: No - Comment as needed  Activities of Daily Living Home Assistive Devices/Equipment: None ADL Screening (condition at time of admission) Patient's cognitive ability adequate to safely complete daily activities?: Yes Is the patient deaf or have difficulty hearing?: No Does the patient have difficulty seeing, even when wearing glasses/contacts?: No Does the patient have difficulty concentrating, remembering, or making decisions?: No Patient able to express need for assistance with ADLs?: Yes Does the patient have difficulty dressing or bathing?: No Independently performs ADLs?: Yes (appropriate for developmental age) Does the patient have difficulty walking or climbing stairs?: No Weakness of Legs: None Weakness of Arms/Hands: None  Permission Sought/Granted Permission sought to share information with : Case Manager, Customer service manager, Family Supports Permission granted to share information with : Yes, Verbal Permission Granted     Permission granted to share info w AGENCY: Home Health        Emotional Assessment Appearance:: Appears stated age Attitude/Demeanor/Rapport: Gracious Affect (typically observed): Calm Orientation: : Oriented to Self, Oriented to Place, Oriented to  Time, Oriented to Situation Alcohol / Substance Use: Not Applicable Psych Involvement: No (comment)  Admission diagnosis:  Pleural effusion [J90] S/P thoracentesis [Z98.890] Pleural effusion, right [J90] Mass of upper lobe of right lung [R91.8] Patient Active Problem List   Diagnosis Date Noted  . Adenocarcinoma ([place]) 03/29/2019  . Mass of upper lobe of right lung  03/27/2019  . Pleural effusion   . Corn of toe 12/11/2017  . Weight loss, unintentional 12/11/2017  . Marijuana use 12/11/2017  . Smoking 08/26/2013  . ANXIETY 11/25/2007  . DEPRESSION 11/25/2007  . Essential hypertension  11/25/2007  . Osteoarthritis 11/25/2007  . 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

## 2019-03-29 NOTE — Progress Notes (Signed)
   Subjective:  Patient seen at the bedside this AM. Pt says she has some chest pain when she coughs and some mild SOB, but otherwise feels about the same as she did yesterday. Feels a little weak as well.    Objective:    Vital Signs (last 24 hours): Vitals:   03/29/19 0755 03/29/19 0812 03/29/19 0830 03/29/19 0839  BP:  100/75 (!) 89/74 106/77  Pulse: (!) 139 (!) 118 (!) 105 (!) 116  Resp:  20 20 20   Temp:  98.8 F (37.1 C) 98.6 F (37 C) 98.5 F (36.9 C)  TempSrc:  Oral Oral Oral  SpO2: (!) 85% 97% 99% 97%  Weight:      Height:        Physical Exam: General Alert and answers questions appropriately, no acute distress  Cardiac Regular rate and rhythm, no murmurs, rubs, or gallops  Pulmonary Decreased  Breath sounds on right. Mild wheezing. No rales/rhonchi  Abdominal Soft, non-tender, without distention    Assessment/Plan:   Active Problems:   Mass of upper lobe of right lung   Adenocarcinoma [facility])  Patient is a 68 year old female who presented with chest pain, shortness of breath and was found to have right upper lobe malignancy and possible bladder malignancy.  # Lung mass in right upper lobe: CT revealed right upper lobe lung mass with effusion status post thoracentesis revealing 1200 ml exudative effusion with cytology revealing adenocarcinoma.  Fluid culture no growth to date.  CT abdomen pelvis did show soft tissue mass in the urinary bladder which is concerning for another primary cancer, will need cystoscopy as outpatient.  Urinalysis was negative for RBC. * Oncology consulted, will evaluate patient.  # Anemia: Hemoglobin 11.9 on presentation, 9.6 today.  Patient had thoracentesis 2 days ago but patient without respiratory distress to suspect hemothorax.  Patient without blood in the urine (UA negative), stool (per patient), or other known source.  Will repeat CBC at noon.  # HTN: Blood pressure stable, continue home lisinopril.  # Alcohol use disorder: CIWA  protocol, 0 on CIWA scores.  Diet: Heart DVT Ppx: Lovenox 40 mg QD Dispo: Anticipated discharge in 1-2 days  Jeanmarie Hubert, MD 03/29/2019, 11:37 AM Pager: [phone]

## 2019-03-29 NOTE — Evaluation (Signed)
Physical Therapy Evaluation Patient Details Name: Kristen Chavez MRN: [id number] DOB: [birth year] Today's Date: 03/29/2019   History of Present Illness  68 yo female admitted with chest pain and fatigue found to have Rt upper lobe mass s/p thoracentesis with concern for malignancy. PMHx: HTN, anxiety, depression  Clinical Impression  Pt pleasant and supine on arrival with SpO2 92% on RA with HR 114, HR rise to 129 with sitting EOB and 139 with limited gait. Pt desaturating to 85% on RA with 66' of gait and educated for activity modification and need for supplemental oxygen with activity with rise to 94% on 2L end of session. Pt with decreased activity tolerance with complete fatigue after 150' with assist, decreased cardiopulmonary function and decreased mobility who will benefit from acute therapy to maximize independence and function. Pt notably upset over current medical concerns. Will continue to follow and recommend daily mobility with staff with O2 monitored.      Follow Up Recommendations Home health PT;Supervision for mobility/OOB    Equipment Recommendations  Rolling walker with 5" wheels    Recommendations for Other Services OT consult     Precautions / Restrictions Precautions Precautions: Fall Precaution Comments: watch sats      Mobility  Bed Mobility Overal bed mobility: Modified Independent                Transfers Overall transfer level: Modified independent                  Ambulation/Gait Ambulation/Gait assistance: Min guard Gait Distance (Feet): 150 Feet Assistive device: 1 person hand held assist Gait Pattern/deviations: Step-through pattern;Decreased stride length   Gait velocity interpretation: 1.31 - 2.62 ft/sec, indicative of limited community ambulator General Gait Details: pt needing UE support for balance with noted drop to 85% on rA with 75' of gait. Cues for decreased speed, breathing technique and increased assist to return to room.  Pt immediately sat on bed wanting to try again with O2 however with O2 and short walk to bathroom pt too fatigued to continue with return to bed  Stairs            Wheelchair Mobility    Modified Rankin (Stroke Patients Only)       Balance Overall balance assessment: Needs assistance Sitting-balance support: No upper extremity supported;Feet supported Sitting balance-Leahy Scale: Good     Standing balance support: Single extremity supported Standing balance-Leahy Scale: Fair Standing balance comment: able to stand at sink, UE support for gait                             Pertinent Vitals/Pain Pain Assessment: 0-10 Pain Score: 2  Pain Location: right chest Pain Descriptors / Indicators: Sore Pain Intervention(s): Limited activity within patient's tolerance;Repositioned    Home Living Family/patient expects to be discharged to:: Private residence Living Arrangements: Alone Available Help at Discharge: Family;Available 24 hours/day Type of Home: House Home Access: Stairs to enter   [employer] of Steps: 2 Home Layout: One level Home Equipment: None      Prior Function Level of Independence: Independent               Hand Dominance        Extremity/Trunk Assessment   Upper Extremity Assessment Upper Extremity Assessment: Generalized weakness    Lower Extremity Assessment Lower Extremity Assessment: Generalized weakness    Cervical / Trunk Assessment Cervical / Trunk Assessment: Normal  Communication  Cognition Arousal/Alertness: Awake/alert Behavior During Therapy: WFL for tasks assessed/performed Overall Cognitive Status: Within Functional Limits for tasks assessed                                        General Comments      Exercises     Assessment/Plan    PT Assessment Patient needs continued PT services  PT Problem List Decreased strength;Decreased mobility;Decreased safety  awareness;Decreased activity tolerance;Decreased balance;Decreased knowledge of use of DME;Cardiopulmonary status limiting activity       PT Treatment Interventions Gait training;Therapeutic exercise;Patient/family education;Stair training;Balance training;Functional mobility training;DME instruction;Therapeutic activities    PT Goals (Current goals can be found in the Care Plan section)  Acute Rehab PT Goals Patient Stated Goal: go home and cook PT Goal Formulation: With patient Time For Goal Achievement: 04/12/19 Potential to Achieve Goals: Fair    Frequency Min 3X/week   Barriers to discharge        Co-evaluation               AM-PAC PT "6 Clicks" Mobility  Outcome Measure Help needed turning from your back to your side while in a flat bed without using bedrails?: None Help needed moving from lying on your back to sitting on the side of a flat bed without using bedrails?: None Help needed moving to and from a bed to a chair (including a wheelchair)?: None Help needed standing up from a chair using your arms (e.g., wheelchair or bedside chair)?: None Help needed to walk in hospital room?: A Little Help needed climbing 3-5 steps with a railing? : A Lot 6 Click Score: 21    End of Session   Activity Tolerance: Patient tolerated treatment well Patient left: in bed;with call bell/phone within reach Nurse Communication: Mobility status PT Visit Diagnosis: Other abnormalities of gait and mobility (R26.89);Muscle weakness (generalized) (M62.81);Unsteadiness on feet (R26.81)    Time: 6568-1275 PT Time Calculation (min) (ACUTE ONLY): 21 min   Charges:   PT Evaluation $PT Eval Moderate Complexity: [place], PT Acute Rehabilitation Services Pager: [phone] Office: [phone]   Kristen Chavez 03/29/2019, 8:52 AM

## 2019-03-29 NOTE — Evaluation (Signed)
Occupational Therapy Evaluation Patient Details Name: Kristen Chavez MRN: [id number] DOB: Mar 29, 1951 Today's Date: 03/29/2019    History of Present Illness 68 yo female admitted with chest pain and fatigue found to have Rt upper lobe mass s/p thoracentesis with concern for malignancy. PMHx: HTN, anxiety, depression   Clinical Impression   Patient was seen for skilled OT to assess need for skilled  OT. Pt has decreased I and safety with ADLs, mobility, and IADLs. Pt. States her children can assist at home.  Pt. Has decreased activity tolernce and becomes SOB with minimal activity. Pt. With new dx of lung mass. Acute ot to follow.    Follow Up Recommendations  Home health OT    Equipment Recommendations  (patient does not want shower seat)    Recommendations for Other Services       Precautions / Restrictions Precautions Precautions: Fall Precaution Comments: watch sats Restrictions Weight Bearing Restrictions: No      Mobility Bed Mobility Overal bed mobility: Modified Independent                Transfers                      Balance                                           ADL either performed or assessed with clinical judgement   ADL Overall ADL's : Needs assistance/impaired Eating/Feeding: Independent   Grooming: Wash/dry hands;Wash/dry face;Supervision/safety;Standing   Upper Body Bathing: Supervision/ safety;Set up;Sitting   Lower Body Bathing: Minimal assistance;Sit to/from stand   Upper Body Dressing : Minimal assistance;Sitting   Lower Body Dressing: Minimal assistance;Sit to/from stand   Toilet Transfer: Min guard;Ambulation           Functional mobility during ADLs: Min guard;Rolling walker(Patient with low endurance and activity tolerance) General ADL Comments: patient has decreased tolerance for acitivty and needs ed on energy conservation     Vision Baseline Vision/History: Wears glasses(patient with hx of  torn retina and cataract sx)       Perception     Praxis      Pertinent Vitals/Pain Pain Assessment: 0-10 Pain Score: 2  Pain Location: right chest Pain Descriptors / Indicators: Sore Pain Intervention(s): Premedicated before session     Hand Dominance     Extremity/Trunk Assessment Upper Extremity Assessment Upper Extremity Assessment: Generalized weakness           Communication     Cognition Arousal/Alertness: Awake/alert Behavior During Therapy: WFL for tasks assessed/performed Overall Cognitive Status: Within Functional Limits for tasks assessed                                     General Comments       Exercises     Shoulder Instructions      Home Living Family/patient expects to be discharged to:: Private residence Living Arrangements: Alone Available Help at Discharge: Family;Available 24 hours/day Type of Home: House Home Access: Stairs to enter [employer] of Steps: 2   Home Layout: One level     Bathroom Shower/Tub: Teacher, early years/pre: Standard     Home Equipment: None          Prior Functioning/Environment Level of Independence: Independent  OT Problem List: Decreased strength;Decreased activity tolerance;Impaired balance (sitting and/or standing);Decreased knowledge of use of DME or AE;Cardiopulmonary status limiting activity      OT Treatment/Interventions: Self-care/ADL training;Therapeutic exercise;Energy conservation;DME and/or AE instruction;Therapeutic activities    OT Goals(Current goals can be found in the care plan section) Acute Rehab OT Goals Patient Stated Goal: go home  OT Frequency: Min 2X/week   Barriers to D/C:            Co-evaluation              AM-PAC OT "6 Clicks" Daily Activity     Outcome Measure Help from another person eating meals?: None Help from another person taking care of personal grooming?: A Little Help from another  person toileting, which includes using toliet, bedpan, or urinal?: A Little Help from another person bathing (including washing, rinsing, drying)?: A Little Help from another person to put on and taking off regular upper body clothing?: A Little Help from another person to put on and taking off regular lower body clothing?: A Little 6 Click Score: 19   End of Session Equipment Utilized During Treatment: Rolling walker Nurse Communication: (ok therapy)  Activity Tolerance: Patient limited by fatigue Patient left: in bed;with call bell/phone within reach  OT Visit Diagnosis: Unsteadiness on feet (R26.81);Muscle weakness (generalized) (M62.81)                Time: 0037-0488 OT Time Calculation (min): 30 min Charges:  OT General Charges $OT Visit: 1 Visit OT Evaluation $OT Eval Low Complexity: 1 Low OT Treatments $Self Care/Home Management : 8-91 mins  6 clicks  Kallan Bischoff 03/29/2019, 12:39 PM

## 2019-03-29 NOTE — Plan of Care (Signed)
Patient to be seen to increase patient I and tolerance for activity with ADLs and mobility.

## 2019-03-29 NOTE — Care Management Important Message (Signed)
Important Message  Patient Details  Name: Kristen Chavez MRN: [id number] Date of Birth: 1950-10-30   Medicare Important Message Given:  Yes     [name] 03/29/2019, 1:06 PM

## 2019-03-30 DIAGNOSIS — J910 Malignant pleural effusion: Secondary | ICD-10-CM

## 2019-03-30 LAB — BODY FLUID CULTURE: Culture: NO GROWTH

## 2019-03-30 LAB — CBC
HCT: 30.3 % — ABNORMAL LOW (ref 36.0–46.0)
Hemoglobin: 10.1 g/dL — ABNORMAL LOW (ref 12.0–15.0)
MCH: 26.9 pg (ref 26.0–34.0)
MCHC: 33.3 g/dL (ref 30.0–36.0)
MCV: 80.6 fL (ref 80.0–100.0)
Platelets: 365 10*3/uL (ref 150–400)
RBC: 3.76 MIL/uL — ABNORMAL LOW (ref 3.87–5.11)
RDW: 14.7 % (ref 11.5–15.5)
WBC: 6.7 10*3/uL (ref 4.0–10.5)
nRBC: 0 % (ref 0.0–0.2)

## 2019-03-30 MED ORDER — ALBUTEROL SULFATE HFA 108 (90 BASE) MCG/ACT IN AERS: 1.0000 | INHALATION_SPRAY | Freq: Four times a day (QID) | RESPIRATORY_TRACT | Status: DC | PRN

## 2019-03-30 MED ORDER — ALBUTEROL SULFATE (2.5 MG/3ML) 0.083% IN NEBU: 2.5000 mg | INHALATION_SOLUTION | Freq: Four times a day (QID) | RESPIRATORY_TRACT | Status: DC | PRN

## 2019-03-30 MED ORDER — HYDROXYZINE HCL 25 MG PO TABS
25.0000 mg | ORAL_TABLET | ORAL | Status: AC | PRN
  Administered 2019-03-31 – 2019-04-02 (×3): 25 mg via ORAL
  Filled 2019-03-30 (×3): qty 1

## 2019-03-30 MED ORDER — GUAIFENESIN-DM 100-10 MG/5ML PO SYRP
5.0000 mL | ORAL_SOLUTION | ORAL | Status: DC | PRN
  Administered 2019-03-30: 5 mL via ORAL
  Filled 2019-03-30 (×2): qty 5

## 2019-03-30 NOTE — Progress Notes (Signed)
   03/30/19 1003  MEWS Assessment  Is this an acute change? No

## 2019-03-30 NOTE — Progress Notes (Signed)
  Subjective:  Patient seen at bedside this morning.  Patient reports that her shortness of breath is improved, but she still feels weak and a little short of breath when she moves around.  Patient says she worked with physical therapy yesterday and was instructed on using the rolling walker.  Talked with patient about importance of following up with oncologist and urology as outpatient.  Also spoke with patient how we want the primary care doctor to follow-up on her low blood counts.  Objective:    Vital Signs (last 24 hours): Vitals:   03/29/19 2051 03/30/19 0026 03/30/19 0552 03/30/19 0934  BP: 90/70 1'00/65 93/68 93/77 '$  Pulse: (!) 114 97 97 (!) 106  Resp: '20 16 18   '$ Temp: 98.9 F (37.2 C) 98.9 F (37.2 C) 98.3 F (36.8 C)   TempSrc: Oral  Oral   SpO2: 92% 99% 95% 94%  Weight:   63.2 kg   Height:        Physical Exam: General Alert and answers questions appropriately, no acute distress  Cardiac Regular rate and rhythm, no murmurs, rubs, or gallops  Pulmonary Decreased breath sounds on right, no rales/rhonchi   Assessment/Plan:   Active Problems:   Mass of upper lobe of right lung   Adenocarcinoma (HCC)   Pleural effusion, malignant  Patient is a 68 year old female presented with chest pain, shortness of breath and was found to have right upper lobe malignancy and possible bladder cancer.  # Lung mass in right upper lobe: On presentation, CT revealed right upper lobe lung mass with effusion status post thoracentesis revealing tolerated no exudative effusion cytology showing adenocarcinoma.  Acid-fast smear negative, pleural fluid culture no growth at 2 days, CT brain and CT abdomen pelvis without evidence for metastasis but did show a mass in the urinary bladder concerning for primary cancer, will need cystoscopy as outpatient.   * Patient evaluated by Dr. Irene Limbo with oncology - he is confirming with pathology if adequate cell volume is available through PDL1 and foundation 1  testing and if so patient may not need another biopsy.  Discussed with patient that her metastatic disease is not curable but can be treated with a palliative intent. * Patient to be discharged today with follow-up with oncology.  We will also instruct for follow-up with  and wellness where she get a referral for urology.  # Anemia: Hemoglobin within normal limits on presentation, 10.1 today stable over the past couple of days.  Will need further follow-up as outpatient.  # HTN: Presented on home lisinopril and hydrochlorothiazide, continued on lisinopril as inpatient.  Pressures have been low as inpatient, lisinopril discontinued this morning and will discharge off both blood pressure medications with PCP to further evaluate as outpatient.  Dispo: Anticipated discharge today  Jeanmarie Hubert, MD 03/30/2019, 10:25 AM Pager: [phone]

## 2019-03-30 NOTE — Care Management (Signed)
Notified [facility] that patient will DC today. No further CM needs identified.

## 2019-03-30 NOTE — Discharge Summary (Addendum)
Name: Kristen Chavez MRN: [id number] DOB: [birth year] 68 y.o. PCP: Kristen Pier, MD  Date of Admission: 03/26/2019  9:57 PM Date of Discharge: 04/04/2019 Attending Physician: Dr. Dareen Piano  Discharge Diagnosis: 1. Adenocarcinoma of the lung 2. Malignant pleural effusion 3. Urinary bladder mass 4. Anemia  Discharge Medications: Allergies as of 04/04/2019   No Known Allergies     Medication List    STOP taking these medications   hydrochlorothiazide 25 MG tablet Commonly known as: HYDRODIURIL   lisinopril 40 MG tablet Commonly known as: ZESTRIL     TAKE these medications   acetaminophen 500 MG tablet Commonly known as: TYLENOL Take 1 tablet (500 mg total) by mouth every 6 (six) hours as needed for moderate pain or headache.   albuterol 108 (90 Base) MCG/ACT inhaler Commonly known as: VENTOLIN HFA Inhale 1-2 puffs into the lungs every 6 (six) hours as needed for wheezing or shortness of breath.   buPROPion 300 MG 24 hr tablet Commonly known as: WELLBUTRIN XL TAKE 1 TABLET (300 MG TOTAL) BY MOUTH DAILY. Notes to patient: 9/24   cetirizine 10 MG tablet Commonly known as: ZYRTEC Take 1 tablet (10 mg total) by mouth daily.   citalopram 40 MG tablet Commonly known as: CELEXA Take 1 tablet (40 mg total) by mouth daily. What changed: how much to take Notes to patient: 9/24   fluticasone 50 MCG/ACT nasal spray Commonly known as: FLONASE Place 2 sprays into both nostrils daily.   hydrOXYzine 25 MG tablet Commonly known as: ATARAX/VISTARIL Take 1 tablet (25 mg total) by mouth 3 (three) times daily as needed.   Lavender Oil Oil Apply 1 application topically 2 (two) times daily as needed (knee pain).   methotrexate 2.5 MG tablet Commonly known as: RHEUMATREX Take 25 mg by mouth every Friday. Caution:Chemotherapy. Protect from light.   prednisoLONE acetate 1 % ophthalmic suspension Commonly known as: PRED FORTE Place 1 drop into the left eye 4 (four) times  daily.   traZODone 50 MG tablet Commonly known as: DESYREL Take 0.5-1 tablets (25-50 mg total) by mouth at bedtime as needed for sleep.       Disposition and follow-up:   Ms.Kristen Chavez was discharged from [facility] in stable condition.  At the hospital follow up visit please address:  1. Please ensure patient has been able to followup with both oncology and urology. * Followup with oncology needed for lung cancer (see below) * Followup with urology for urinary bladder mass (see below) * If patient experiences acutely worsening shortness of breath, consider worsening of pleural effusion/compromise of drainage catheter. Please also consider PE given malignancy. Patient did have CT chest with contrast (9/16) which did not show evidence of PE  2.  Labs / imaging needed at time of follow-up: CBC (to assess anemia), CXR to assess pleural effusion  3.  Pending labs/ test needing follow-up: Thoracentesis acid fast culture  Follow-up Appointments: Follow-up Information    [facility], [place] Follow up.   Why: [facility] will reach out to schedule home health therapies.  Contact information: Kristen Chavez [id number] [phone]        Kristen Genera, MD. Go on 04/17/2019.   Specialties: Hematology, Oncology Why: Please go to your appointment on October 7th at 10:00am Contact information: Kristen Chavez [address] [phone]        Kristen Pier, MD Follow up.   Specialty: Internal Medicine Why: Please  call for an appointment within the next week. At this appointment, please ask for a referral to urology for the mass that was seen in your bladder. Contact information: [name] [address] [phone]        [facility], [facility] Follow up.   Specialty: Home Health Services Why: RN, PT, OT Contact information: [place] [address] [phone]            Hospital Course by problem list: # Malignant pleural effusion: # Adenocarcinoma of the lung: Patient presented with chest pain, shortness of breath and has significant smoking history.  CT scan demonstrated right upper lobe mass, extensive adenopathy, multifocal pleural metastatic disease, as well as a large right pleural effusion.  There was no evidence for superimposed pneumonia on the scan, WBC 8.5, patient afebrile.  Pulmonology was consulted and performed thoracentesis (03/27/19) removing 1.2 L bloody pleural fluid.  Follow-up chest x-ray showed no evidence for pneumothorax.  Fluid analysis was consistent with exudative effusion with cytology revealing adenocarcinoma.  CT of the brain and CT of the abdomen/pelvis did not show evidence for metastatic lung carcinoma, but did demonstrate mass in the urinary bladder (see problem below).    Evening of 9/19 patient had shortness of breath, tachycardia, no increase in oxygen requirement.  Repeat chest x-ray the following morning showed interval worsening with opacification of most of the right hemithorax.  TCTS consulted and placed Pleurx catheter with drainage of 2.5 L of serosanguineous fluid.  Patient developed oxygen requirement and required 2 L nasal cannula to maintain saturations while ambulating.  Patient was trained on usage of drainage catheter with instructions to drain fluid every other day.  Patient was evaluated by Dr. Irene Limbo with oncology, and he is confirming with pathology if adequate cell volume is available for PDL1 and foundation one testing.  If so, patient may not need another biopsy.  Patient was informed by oncology, that her metastatic disease is not curable but can be treated with a palliative intent.  Patient was discharged with oncology follow-up.  Patient was evaluated by palliative care as she was not sure she wanted to proceed with aggressive treatment.  Patient decided that she would go to appointment with oncologist  and make decision regarding treatment after she had all her options.  Patient provided palliative team contact information if she decides to pursue that route following discharge.  Patient discharged with home oxygen, walker, bedside commode, PT/OT/RN/home health aide.  # Urinary bladder mass: When conducting lung cancer work-up, 2 cm mucosal soft tissue mass was incidentally detected in the urinary bladder on CT abdomen/pelvis.  UA was negative for RBCs.  Patient will need outpatient follow-up with urology for cystoscopy.  # Symptomatic Anemia: Patient hemoglobin 11.9 on presentation which down trended during admission to a value of 7.0 on 04/03/2019.  Iron studies consistent with anemia of chronic disease.  Patient was raised Jehovah's Witness she was hesitant to accept blood products but she decided to proceed with blood transfusion.  Hemoglobin responded appropriately to 8.0 status post 1 unit PRBCs.  The following morning, hemoglobin was stable/increasing at 8.8.  Patient had continued serosanguineous drainage from Pleurx catheter and it was confirmed with TCTS that this drainage has been present since postop.  # Anxiety: Developed anxiety secondary to new diagnosis which was treated with PRN hydroxyzine as inpatient with good response.  Patient provided prescription for hydroxyzine as outpatient.  # HTN: Patient was taking hydrochlorothiazide 25 mg  daily and lisinopril 40 mg daily as outpatient.  Patient had low-normal blood pressures as inpatient so these medications were discontinued as inpatient. Patient discharged with instructions not to resume these medications unless instructed to do so by outpatient provider.  Discharge Vitals:   BP 95/76 (BP Location: Right Arm)   Pulse 94   Temp 98.6 F (37 C) (Oral)   Resp 20   Ht 5' 2.99" (1.6 m)   Wt 60.8 kg   SpO2 100%   BMI 23.76 kg/m   Pertinent Labs, Studies, and Procedures:  CBC Latest Ref Rng & Units 04/04/2019 04/03/2019 04/03/2019   WBC 4.0 - 10.5 K/uL 11.8(H) - 7.8  Hemoglobin 12.0 - 15.0 g/dL 8.8(L) 8.0(L) 7.0(L)  Hematocrit 36.0 - 46.0 % 26.8(L) 24.9(L) 21.3(L)  Platelets 150 - 400 K/uL 532(H) - 422(H)   BMP Latest Ref Rng & Units 04/02/2019 04/01/2019 03/28/2019  Glucose 70 - 99 mg/dL 101(H) 128(H) 96  BUN 8 - 23 mg/dL '12 13 13  '$ Creatinine 0.44 - 1.00 mg/dL 1.04(H) 1.11(H) 0.84  BUN/Creat Ratio 12 - 28 - - -  Sodium 135 - 145 mmol/L 136 137 136  Potassium 3.5 - 5.1 mmol/L 4.7 4.2 3.7  Chloride 98 - 111 mmol/L 106 102 104  CO2 22 - 32 mmol/L 22 25 21(L)  Calcium 8.9 - 10.3 mg/dL 8.1(L) 8.6(L) 8.5(L)   Pleural fluid cytology (03/27/19): Malignant cells consistent with adenocarcinoma  CT Chest W Contrast (03/27/19): FINDINGS: Cardiovascular: No cardiomegaly. No pericardial effusion. Malignant distortion/narrowing of right-sided pulmonary arteries. No acute vascular finding.  Mediastinum/Nodes: Right upper lobe mass encasing the central vessels with heterogeneous central enhancement attributed to necrosis. Thoracic adenopathy at the right hilum, subcarinal station, paratracheal station, and AP window, and left pulmonary hilum. Confluent right hilar nodal mass measures up to 4.3 cm on axial slices.  Lungs/Pleura: Right upper lobe mass which is branching and blends with the hilar mass, limiting reproducible measurement. There is a large right pleural effusion with multifocal pleural nodularity. No left lung metastasis. No suspected superimposed edema or pneumonia.  Upper Abdomen: No acute finding or evident metastasis.  Musculoskeletal: Advanced thoracic spine degeneration with multilevel osteitis. No acute or aggressive finding.  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 The right pleural effusion is large volume.  CT Head W & WO Contrast (03/28/19): IMPRESSION: Unremarkable CT appearance of the brain. No  evidence of intracranial metastatic disease.  CT Abdomen/Pelvis W Contrast (03/28/19): IMPRESSION: No evidence of metastatic lung carcinoma within the abdomen or pelvis.  2 cm mucosal soft tissue mass in the urinary bladder, highly suspicious for primary bladder carcinoma. Recommend urology referral for cystoscopy.  Malignant right pleural effusion and lymphadenopathy, as better demonstrated on recent chest CT.  Pleural fluid culture (03/27/19): No growth at 3 days  [facility] labs: Protein (serum):  7.0 LDH (serum):  180 Protein (pleural fluid): 5.1 LDH (pleural fluid): 373   Urinalysis    Component Value Date/Time   COLORURINE YELLOW 03/28/2019 1850   APPEARANCEUR HAZY (A) 03/28/2019 1850   LABSPEC 1.039 (H) 03/28/2019 1850   PHURINE 6.0 03/28/2019 1850   GLUCOSEU NEGATIVE 03/28/2019 1850   HGBUR NEGATIVE 03/28/2019 [place] 03/28/2019 1850   BILIRUBINUR neg 06/26/2015 [place] 03/28/2019 1850   PROTEINUR NEGATIVE 03/28/2019 1850   UROBILINOGEN 0.2 06/26/2015 1102   NITRITE NEGATIVE 03/28/2019 1850   LEUKOCYTESUR LARGE (A) 03/28/2019 1850  Discharge Instructions: Discharge Instructions    Call MD for:  difficulty breathing, headache or visual disturbances   Complete by: As directed    Call MD for:  difficulty breathing, headache or visual disturbances   Complete by: As directed    Call MD for:  extreme fatigue   Complete by: As directed    Call MD for:  extreme fatigue   Complete by: As directed    Call MD for:  persistant dizziness or light-headedness   Complete by: As directed    Call MD for:  persistant dizziness or light-headedness   Complete by: As directed    Call MD for:  redness, tenderness, or signs of infection (pain, swelling, redness, odor or green/yellow discharge around incision site)   Complete by: As directed    Call MD for:  temperature >100.4   Complete by: As directed    Call MD for:  temperature  >100.4   Complete by: As directed    Diet - low sodium heart healthy   Complete by: As directed    Discharge instructions   Complete by: As directed    You were seen for difficulty breathing and were found to have lung cancer.  It is important for you to follow-up with your outpatient physicians so they can help you in managing your symptoms and choosing good treatments.  We have provided you with the number for Dr. Irene Limbo, your oncologist.  Please call him if you do not hear from his clinic in the next few days.  We also want you to schedule an appointment for within the next week with your primary care doctor at [facility].  At this appointment, please ask about getting a referral to a urologist.  There is a mass in your bladder and it is important for you to be seen by a urologist who can evaluate this further.  Please take all your medications as prescribed.    We want you to stop taking both your blood pressure medications (lisinopril and hydrochlorothiazide) as your blood pressure has been lower in the hospital.  At your follow-up visit with your primary care provider, they may decide to restart these medications based on your blood pressure.   Discharge instructions   Complete by: As directed    You were seen in the hospital for shortness of breath and were found to have lung cancer. There was fluid in your lungs for which a catheter has been placed to drain the fluid. An appointment has been setup for you with Dr. Irene Limbo, your oncologist, where you can discuss treatment options. It is important for you to take all your medications as prescribed. We also want you to followup with your primary care provider within the next week.  Please stop taking your blood pressure medications (hydrochlorothiazide and lisinopril). Your blood pressure was lower during your hospitalization and you did not need these medications.  At your follow-up appointments, your providers may decide to restart these  medications if your blood pressure increases again.  Thank you for allowing [place] to be part of your medical care   Increase activity slowly   Complete by: As directed    Increase activity slowly   Complete by: As directed       Signed: Jeanmarie Hubert, MD 04/06/2019, 9:01 AM   Pager: [phone]

## 2019-03-30 NOTE — Progress Notes (Signed)
Occupational Therapy Treatment Patient Details Name: Kristen Chavez MRN: [id number] DOB: 1950-11-08 Today's Date: 03/30/2019    History of present illness 68 yo female admitted with chest pain and fatigue found to have Rt upper lobe mass s/p thoracentesis with concern for malignancy. PMHx: HTN, anxiety, depression   OT comments  Pt. Was ed to not to amb without assist secondary to decreased endurance. Encouraged patient to sit up at home and to amb with family. Patient was ed onenergy conservation for adsl and mobility. Pt. Was ed to not attempt shower transfer by herself and that a shower seat was recommended. Pt. States she will be staying with dtr who can assist with care. Pt. Plans on having hh therapy to increase ability ot care for herself.   Follow Up Recommendations  Home health OT    Equipment Recommendations       Recommendations for Other Services      Precautions / Restrictions Precautions Precautions: Fall Precaution Comments: watch sats Restrictions Weight Bearing Restrictions: No       Mobility Bed Mobility Overal bed mobility: Modified Independent                Transfers   Equipment used: Rolling walker (2 wheeled)                  Balance                                           ADL either performed or assessed with clinical judgement   ADL       Grooming: Wash/dry hands;Wash/dry face;Supervision/safety;Standing               Lower Body Dressing: Minimal assistance                 General ADL Comments: Pt. was ed on energy conservation technique for ADLs. Patietn was ed on dme options to increae safety and I with adl transfers. patient with low endurance and was able to stand fort 2 min at sink     Vision       Perception     Praxis      Cognition Arousal/Alertness: Awake/alert Behavior During Therapy: WFL for tasks assessed/performed Overall Cognitive Status: Within Functional Limits for  tasks assessed                                          Exercises     Shoulder Instructions       General Comments      Pertinent Vitals/ Pain       Pain Assessment: 0-10 Pain Score: 3  Pain Location: right chest Pain Descriptors / Indicators: Sore Pain Intervention(s): Patient requesting pain meds-RN notified  Home Living                                          Prior Functioning/Environment              Frequency  Min 2X/week        Progress Toward Goals  OT Goals(current goals can now be found in the care plan section)  Progress towards OT goals: Progressing toward goals  Acute  Rehab OT Goals Patient Stated Goal: go home  Plan      Co-evaluation                 AM-PAC OT "6 Clicks" Daily Activity     Outcome Measure   Help from another person eating meals?: None Help from another person taking care of personal grooming?: A Little Help from another person toileting, which includes using toliet, bedpan, or urinal?: A Little Help from another person bathing (including washing, rinsing, drying)?: A Little Help from another person to put on and taking off regular upper body clothing?: A Little Help from another person to put on and taking off regular lower body clothing?: A Little 6 Click Score: 19    End of Session Equipment Utilized During Treatment: Rolling walker  OT Visit Diagnosis: Unsteadiness on feet (R26.81);Muscle weakness (generalized) (M62.81)   Activity Tolerance Patient limited by fatigue   Patient Left in bed;with call bell/phone within reach   Nurse Communication          Time: 9509-3267 OT Time Calculation (min): 23 min  Charges: OT General Charges $OT Visit: 1 Visit OT Treatments $Self Care/Home Management : 12-45 mins  6 clicks   Mykale Gandolfo 03/30/2019, 1:17 PM

## 2019-03-30 NOTE — Progress Notes (Signed)
Paged about patient being short of breath, tachycardic and anxious requesting Ativan by RN. Patient assessed at bedside. She is resting comfortably. No increase in O2 requirements. She does not appear to be in any respiratory distress. Pulmonary exam with decreased breath sounds at right base (unchanged from primary team's assessment earlier in the day) and clear to auscultation in remainder lung fields. At this time, patient does not need CXR. Order placed for Atarax 25mg  q4h prn.

## 2019-03-31 ENCOUNTER — Inpatient Hospital Stay (HOSPITAL_COMMUNITY): Payer: Medicare Other

## 2019-03-31 DIAGNOSIS — J910 Malignant pleural effusion: Secondary | ICD-10-CM

## 2019-03-31 LAB — APTT: aPTT: 38 seconds — ABNORMAL HIGH (ref 24–36)

## 2019-03-31 LAB — ABO/RH: ABO/RH(D): O POS

## 2019-03-31 MED ORDER — CEFAZOLIN SODIUM-DEXTROSE 2-4 GM/100ML-% IV SOLN
2.0000 g | INTRAVENOUS | Status: AC
  Administered 2019-04-01: 2 g via INTRAVENOUS
  Filled 2019-03-31 (×2): qty 100

## 2019-03-31 MED ORDER — KETOROLAC TROMETHAMINE 15 MG/ML IJ SOLN
15.0000 mg | Freq: Once | INTRAMUSCULAR | Status: AC
  Administered 2019-03-31: 15 mg via INTRAVENOUS
  Filled 2019-03-31: qty 1

## 2019-03-31 NOTE — Progress Notes (Signed)
Patient complaining back/hip pain, she has been given tylenol  2x on day shift and patient stated that tylenol is ineffective. Atarax was given as well.  Page the internal med intern COE 2x. Still awaiting to respond.

## 2019-03-31 NOTE — Progress Notes (Addendum)
Subjective: Patient seen at bedside this morning. Updated patient and patient's daughter over phone about CXR showing return of pleural effusion that was most likely causing her increased shortness of breath last night. Discussed treatment options of repeat thoracentesis and chest tube thoracostomy. Patient tearful and asked how much time she had left. Discussed lack of clear answer and recommended speaking with Kristen Chavez, her oncologist, who may have a clearer picture. Patient visited by family friend.   Objective:  Vital signs in last 24 hours: Vitals:   03/30/19 2115 03/31/19 0536 03/31/19 0916 03/31/19 0918  BP: 97/70 92/77  100/79  Pulse: (!) 102 (!) 102 (!) 114 (!) 108  Resp: 16 20    Temp: 98.6 F (37 C) 98.2 F (36.8 C)    TempSrc:  Oral    SpO2: 100% 100% 94% 97%  Weight:      Height:       Weight change:   Intake/Output Summary (Last 24 hours) at 03/31/2019 1149 Last data filed at 03/31/2019 0920 Gross per 24 hour  Intake 360 ml  Output -  Net 360 ml   Physical Exam  Constitutional: She appears well-developed and well-nourished. She appears distressed.  HENT:  Head: Normocephalic and atraumatic.  Eyes: EOM are normal.  Neck: Normal range of motion. Neck supple.  Pulmonary/Chest: She has decreased breath sounds in the right upper field, the right middle field and the right lower field.  Patient able to speak in complete sentences. Respiratory effort increased when leaning forward. Dullness to percussion over right lung areas.  Musculoskeletal: Normal range of motion.        General: No edema.  Neurological: She is alert.  Answered questions appropriately.  Skin: Skin is warm, dry and intact. Nails show no clubbing.  Psychiatric:  Patient appeared sad and tearful when discussing return of fluid in her lungs.    Assessment/Plan: Kristen Chavez is a 68 y.o. female on hospital day 5 with history of HTN, OA, and depression and was found to have right upper lobe  malignancy, possible bladder cancer and presented with increased SOB overnight.   Active Problems:   Mass of upper lobe of right lung   Adenocarcinoma (HCC)   Pleural effusion, malignant  Adenocarcinoma of the right lung: CXR today showed no change in pleural masses noted at the right apex from prior exam. Left lung remains clear, however hyperexpanded.  *Follow-up with Kristen Chavez with oncology as outpatient in two weeks *Possible PDL1 and foundation 1 testing is being determined via pathology *Refer to urology at [facility] as outpatient to assess possible malignancy in bladder.  Pleural effusion, malignant: Patient had overnight episode of shortness of breath, tachycardia, and anxiety and was found to be resting comfortably with no increase in O2 requirements. Patient placed on Atarax '25mg'$  q4h prn. CXR today showed significant interval worsening. There is opacification of most of the right hemithorax. This is likely a combination of an enlarged pleural effusion with atelectasis and lung masses and nodules. This finding is worrisome due to rapid return of pleural fluid considering last thoracentesis was only a few days ago.  *Consult with pulmonology to determine need for and perform repeat thoracentesis and chest tube thoracostomy to drain fluid.  *Continue Atarax 25 mg q4h prn.  Anemia: CBC yesterday afternoon showed slight decrease in RBC (3.76) and HCT (30.3) from day prior. Hemoglobin remains low but stable from day prior at 10.1 g/dL.  *Follow-up with pcp as outpatient  HTN: Patient presented on home medications lisinopril and hydrochlorothiazide and continued on lisinopril as inpatient. Inpatient blood pressures continue to be low at systolic pressures at 505 and below.  *Continue lisinopril *Dicharge with both lisinopril and hydrochlorothiazide *Follow-up with PCP as outpatient   LOS: 4 days   Kristen Chavez, Medical Student 03/31/2019, 11:49 AM    Attestation for  Student Documentation:  I personally was present and performed or re-performed the history, physical exam and medical decision-making activities of this service and have verified that the service and findings are accurately documented in the student's note.  Kristen Ludwig, MD 03/31/2019, 12:28 PM

## 2019-03-31 NOTE — Consult Note (Signed)
[address]       [place] [address]             [phone]        Alaine B Radick Boardman Medical Record #[id number] Date of Birth: September 06, 1950  Referring: No ref. provider found Primary Care: [name], MD Primary Cardiologist:No primary care provider on file.  Chief Complaint:    Chief Complaint  Patient presents with   Shortness of Breath   Cough    History of Present Illness:     Pleasant 68 yo lady admitted to hospital this week with chest pain and SOB. She was diagnosed with Stage 4 lung CA by pleural effusion and drainage. Now the right effusion has returned and is symptomatic. Consult received for consideration of Pleur-x catheter insertion. She is currently stable and with only mild complaints. Using no supplemental oxygen.    Current Activity/ Functional Status: Patient is independent with mobility/ambulation, transfers, ADL's, IADL's.   Zubrod Score: At the time of surgery this patients most appropriate activity status/level should be described as: []     0    Normal activity, no symptoms []     1    Restricted in physical strenuous activity but ambulatory, able to do out light work []     2    Ambulatory and capable of self care, unable to do work activities, up and about                 more than 50%  Of the time                            []     3    Only limited self care, in bed greater than 50% of waking hours []     4    Completely disabled, no self care, confined to bed or chair []     5    Moribund  Past Medical History:  Diagnosis Date   Anxiety    Arthritis    osteoarthritis.   Depression    Hypertension     Past Surgical History:  Procedure Laterality Date   TONSILLECTOMY      Social History   Tobacco Use  Smoking Status Former Smoker   Packs/day: 0.25   Types: Cigarettes   Quit date: 04/14/2014   Years since quitting: 4.9  Smokeless Tobacco Never Used    Social History   Substance and  Sexual Activity  Alcohol Use Yes   Alcohol/week: 21.0 standard drinks   Types: 21 Glasses of wine per week   Comment: socially wine     No Known Allergies  Current Facility-Administered Medications  Medication Dose Route Frequency Provider Last Rate Last Dose   acetaminophen (TYLENOL) tablet 650 mg  650 mg Oral Q6H PRN Welford Roche, MD   650 mg at 03/31/19 4010   Or   acetaminophen (TYLENOL) suppository 650 mg  650 mg Rectal Q6H PRN Santos-Sanchez, Merlene Morse, MD       albuterol (PROVENTIL) (2.5 MG/3ML) 0.083% nebulizer solution 2.5 mg  2.5 mg Nebulization Q6H PRN Aldine Contes, MD       buPROPion (WELLBUTRIN XL) 24 hr tablet 300 mg  300 mg Oral Daily Santos-Sanchez, Idalys, MD   300 mg at 03/31/19 0919   citalopram (CELEXA) tablet 20 mg  20 mg Oral Daily Welford Roche, MD   20 mg at 03/31/19 0919   enoxaparin (LOVENOX) injection  40 mg  40 mg Subcutaneous Q24H Welford Roche, MD   40 mg at 03/30/19 1707   feeding supplement (ENSURE ENLIVE) (ENSURE ENLIVE) liquid 237 mL  237 mL Oral BID BM Aldine Contes, MD   237 mL at 02/58/52 7782   folic acid (FOLVITE) tablet 1 mg  1 mg Oral Daily Santos-Sanchez, Merlene Morse, MD   1 mg at 03/31/19 0919   guaiFENesin-dextromethorphan (ROBITUSSIN DM) 100-10 MG/5ML syrup 5 mL  5 mL Oral Q4H PRN Asencion Noble, MD   5 mL at 03/30/19 2015   hydrOXYzine (ATARAX/VISTARIL) tablet 25 mg  25 mg Oral Q4H PRN Neva Seat, MD       multivitamin with minerals tablet 1 tablet  1 tablet Oral Daily Welford Roche, MD   1 tablet at 03/31/19 0919   ondansetron (ZOFRAN) tablet 4 mg  4 mg Oral Q6H PRN Welford Roche, MD       Or   ondansetron (ZOFRAN) injection 4 mg  4 mg Intravenous Q6H PRN Santos-Sanchez, Merlene Morse, MD       polyethylene glycol (MIRALAX / GLYCOLAX) packet 17 g  17 g Oral Daily Welford Roche, MD   17 g at 03/29/19 0856   senna-docusate (Senokot-S) tablet 2 tablet  2 tablet Oral BID  Welford Roche, MD   2 tablet at 03/29/19 0854   sodium chloride flush (NS) 0.9 % injection 3 mL  3 mL Intravenous Q12H Welford Roche, MD   3 mL at 03/30/19 0940   thiamine (VITAMIN B-1) tablet 100 mg  100 mg Oral Daily Welford Roche, MD   100 mg at 03/31/19 0919    Medications Prior to Admission  Medication Sig Dispense Refill Last Dose   acetaminophen (TYLENOL) 500 MG tablet Take 1 tablet (500 mg total) by mouth every 6 (six) hours as needed for moderate pain or headache. 30 tablet 1 Past Week at Unknown time   albuterol (VENTOLIN HFA) 108 (90 Base) MCG/ACT inhaler Inhale 1-2 puffs into the lungs every 6 (six) hours as needed for wheezing or shortness of breath.   Past Week at Unknown time   buPROPion (WELLBUTRIN XL) 300 MG 24 hr tablet TAKE 1 TABLET (300 MG TOTAL) BY MOUTH DAILY. 30 tablet 2 Past Week at Unknown time   cetirizine (ZYRTEC) 10 MG tablet Take 1 tablet (10 mg total) by mouth daily. 30 tablet 1 Past Week at Unknown time   citalopram (CELEXA) 40 MG tablet Take 1 tablet (40 mg total) by mouth daily. (Patient taking differently: Take 20 mg by mouth daily. ) 30 tablet 2 Past Week at Unknown time   fluticasone (FLONASE) 50 MCG/ACT nasal spray Place 2 sprays into both nostrils daily. 16 g 1 Past Week at Unknown time   hydrochlorothiazide (HYDRODIURIL) 25 MG tablet TAKE 1 TABLET BY MOUTH DAILY. (Patient taking differently: Take 25 mg by mouth daily. ) 30 tablet 2 Past Week at Unknown time   Lavender Oil OIL Apply 1 application topically 2 (two) times daily as needed (knee pain).   Past Month at Unknown time   lisinopril (ZESTRIL) 40 MG tablet TAKE 1 TABLET BY MOUTH DAILY. (Patient taking differently: Take 40 mg by mouth daily. ) 30 tablet 2 Past Week at Unknown time   methotrexate (RHEUMATREX) 2.5 MG tablet Take 25 mg by mouth every Friday. Caution:Chemotherapy. Protect from light.    03/22/2019 at Unknown time   prednisoLONE acetate (PRED FORTE) 1 %  ophthalmic suspension Place 1 drop into the left eye 4 (four) times  daily.   Past Week at Unknown time   traZODone (DESYREL) 50 MG tablet Take 0.5-1 tablets (25-50 mg total) by mouth at bedtime as needed for sleep. 30 tablet 3 Past Week at Unknown time    Family History  Problem Relation Age of Onset   Hypertension Mother    Rheum arthritis Mother    Other Father        suicide   Diabetes Paternal Grandmother    Diabetes Paternal Grandfather      Review of Systems:   ROS A comprehensive review of systems was negative except for: h/o tobacco use-quit 3 years ago     Cardiac Review of Systems: Y or  [    ]= no  Chest Pain [    ]  Resting SOB [   ] Exertional SOB  [  ]  Orthopnea [  ]   Pedal Edema [   ]    Palpitations [  ] Syncope  [  ]   Presyncope [   ]  General Review of Systems: [Y] = yes [  ]=no Constitional: recent weight change [  ]; anorexia [  ]; fatigue [  ]; nausea [  ]; night sweats [  ]; fever [  ]; or chills [  ]                                                               Dental: Last Dentist visit:   Eye : blurred vision [  ]; diplopia [   ]  ; vision changes [  ];  Amaurosis fugax[  ]; Resp: cough [  ];  wheezing[  ];  hemoptysis[  ]; shortness of breath[  ]; paroxysmal nocturnal dyspnea[  ]; dyspnea on exertion[  ]; or orthopnea[  ];  GI:  gallstones[  ], vomiting[  ];  dysphagia[  ]; melena[  ];  hematochezia [  ]; heartburn[  ];   Hx of  Colonoscopy[  ]; GU: kidney stones [  ]; hematuria[  ];   dysuria [  ];  nocturia[  ];  history of     obstruction [  ]; urinary frequency [  ]             Skin: rash, swelling[  ];, hair loss[  ];  peripheral edema[  ];  or itching[  ]; Musculosketetal: myalgias[  ];  joint swelling[  ];  joint erythema[  ];  joint pain[  ];  back pain[  ];  Heme/Lymph: bruising[  ];  bleeding[  ];  anemia[  ];  Neuro: TIA[  ];  headaches[  ];  stroke[  ];  vertigo[  ];  seizures[  ];   paresthesias[  ];  difficulty walking[   ];  Psych:depression[  ]; anxiety[  ];  Endocrine: diabetes[  ];  thyroid dysfunction[  ];            Physical Exam: BP 105/80    Pulse 95    Temp 98.3 F (36.8 C) (Oral)    Resp 20    Ht 5\' 3"  (1.6 m)    Wt 63.2 kg    SpO2 100%    BMI 24.68 kg/m    General appearance: alert, cooperative and no distress Head:  Normocephalic, without obvious abnormality, atraumatic Neck: no adenopathy, no carotid bruit, no JVD, supple, symmetrical, trachea midline and thyroid not enlarged, symmetric, no tenderness/mass/nodules Lymph nodes: Cervical, supraclavicular, and axillary nodes normal. Resp: diminished breath sounds throughout right chest Cardio: regular rate and rhythm, S1, S2 normal, no murmur, click, rub or gallop GI: soft, non-tender; bowel sounds normal; no masses,  no organomegaly Extremities: extremities normal, atraumatic, no cyanosis or edema Neurologic: Grossly normal  Diagnostic Studies & Laboratory data:     Recent Radiology Findings:   Dg Chest Port 1 View  Result Date: 03/31/2019 CLINICAL DATA:  Shortness of breath.  Cough. EXAM: PORTABLE CHEST 1 VIEW COMPARISON:  Chest radiographs, most recent 03/27/2019. Chest CT, 03/27/2019. FINDINGS: There has been interval worsening. There is opacification of most of the right hemithorax with aeration of a portion of the right upper lobe at the apex. Pleural base masses noted at the right apex stable from prior exam. Left lung is hyperexpanded, but clear. No left pleural effusion. No pneumothorax. IMPRESSION: 1. Significant interval worsening. There is opacification of most of the right hemithorax. This is likely a combination of an enlarged pleural effusion with atelectasis and lung masses and nodules. 2. Left lung remains clear. Electronically Signed   By: Lajean Manes M.D.   On: 03/31/2019 07:59     I have independently reviewed the above radiologic studies and discussed with the patient   Recent Lab Findings: Lab Results  Component Value  Date   WBC 6.7 03/30/2019   HGB 10.1 (L) 03/30/2019   HCT 30.3 (L) 03/30/2019   PLT 365 03/30/2019   GLUCOSE 96 03/28/2019   CHOL 166 02/10/2017   TRIG 68 02/10/2017   HDL 84 02/10/2017   LDLDIRECT 51 04/15/2010   LDLCALC 68 02/10/2017   ALT 20 03/26/2019   AST 21 03/26/2019   NA 136 03/28/2019   K 3.7 03/28/2019   CL 104 03/28/2019   CREATININE 0.84 03/28/2019   BUN 13 03/28/2019   CO2 21 (L) 03/28/2019   TSH 0.640 12/11/2017   HGBA1C 5.4 02/10/2017      Assessment / Plan:      Recurrent, presumed malignant effusion. Agree that Pleur-x is a good option for her. She would like to talk with family today and do the procedure tomorrow. I think this is reasonable as she is lying flat in bed, not tachypneic, and conversant. I have posted the right pleural catheter insertion for tomorrow. I have advised her to report any change in symptoms to RN staff promptly so we can change the plan as needed. Thank you for allowing [place] to assist in her care.     I  spent 30 minutes counseling the patient face to face.   Alois Mincer Z. Orvan Seen, [place] TCTS [phone] 03/31/2019 1:18 PM

## 2019-04-01 ENCOUNTER — Inpatient Hospital Stay (HOSPITAL_COMMUNITY): Payer: Medicare Other | Admitting: Certified Registered"

## 2019-04-01 ENCOUNTER — Inpatient Hospital Stay (HOSPITAL_COMMUNITY): Payer: Medicare Other

## 2019-04-01 ENCOUNTER — Encounter (HOSPITAL_COMMUNITY)
Admission: EM | Disposition: A | Discharge status: Home Health | Payer: Self-pay | Source: Home / Self Care | Attending: Internal Medicine

## 2019-04-01 ENCOUNTER — Encounter: Payer: Self-pay | Admitting: Hematology

## 2019-04-01 ENCOUNTER — Encounter (HOSPITAL_COMMUNITY): Payer: Self-pay | Admitting: Anesthesiology

## 2019-04-01 ENCOUNTER — Telehealth: Payer: Self-pay | Admitting: Hematology

## 2019-04-01 DIAGNOSIS — C3411 Malignant neoplasm of upper lobe, right bronchus or lung: Principal | ICD-10-CM

## 2019-04-01 HISTORY — PX: CHEST TUBE INSERTION: SHX231

## 2019-04-01 LAB — PROTIME-INR
INR: 1.1 (ref 0.8–1.2)
Prothrombin Time: 13.9 seconds (ref 11.4–15.2)

## 2019-04-01 LAB — CBC
HCT: 31 % — ABNORMAL LOW (ref 36.0–46.0)
Hemoglobin: 9.7 g/dL — ABNORMAL LOW (ref 12.0–15.0)
MCH: 25.7 pg — ABNORMAL LOW (ref 26.0–34.0)
MCHC: 31.3 g/dL (ref 30.0–36.0)
MCV: 82 fL (ref 80.0–100.0)
Platelets: 534 10*3/uL — ABNORMAL HIGH (ref 150–400)
RBC: 3.78 MIL/uL — ABNORMAL LOW (ref 3.87–5.11)
RDW: 14.5 % (ref 11.5–15.5)
WBC: 10.2 10*3/uL (ref 4.0–10.5)
nRBC: 0 % (ref 0.0–0.2)

## 2019-04-01 LAB — BASIC METABOLIC PANEL
Anion gap: 10 (ref 5–15)
BUN: 13 mg/dL (ref 8–23)
CO2: 25 mmol/L (ref 22–32)
Calcium: 8.6 mg/dL — ABNORMAL LOW (ref 8.9–10.3)
Chloride: 102 mmol/L (ref 98–111)
Creatinine, Ser: 1.11 mg/dL — ABNORMAL HIGH (ref 0.44–1.00)
GFR calc Af Amer: 58 mL/min — ABNORMAL LOW (ref >60)
GFR calc non Af Amer: 50 mL/min — ABNORMAL LOW (ref >60)
Glucose, Bld: 128 mg/dL — ABNORMAL HIGH (ref 70–99)
Potassium: 4.2 mmol/L (ref 3.5–5.1)
Sodium: 137 mmol/L (ref 135–145)

## 2019-04-01 LAB — BLOOD GAS, ARTERIAL
Acid-Base Excess: 1.1 mmol/L (ref 0.0–2.0)
Bicarbonate: 24.7 mmol/L (ref 20.0–28.0)
Drawn by: 252031
FIO2: 21
O2 Saturation: 91 %
Patient temperature: 98.6
pCO2 arterial: 36.3 mmHg (ref 32.0–48.0)
pH, Arterial: 7.448 (ref 7.350–7.450)
pO2, Arterial: 60.9 mmHg — ABNORMAL LOW (ref 83.0–108.0)

## 2019-04-01 LAB — SARS CORONAVIRUS 2 (TAT 6-24 HRS): SARS Coronavirus 2: NEGATIVE

## 2019-04-01 LAB — SURGICAL PCR SCREEN
MRSA, PCR: NEGATIVE
Staphylococcus aureus: NEGATIVE

## 2019-04-01 SURGERY — INSERTION, PLEURAL DRAINAGE CATHETER
Anesthesia: Monitor Anesthesia Care | Site: Thoracic | Laterality: Right

## 2019-04-01 MED ORDER — PROPOFOL 10 MG/ML IV BOLUS
INTRAVENOUS | Status: AC
  Filled 2019-04-01: qty 20

## 2019-04-01 MED ORDER — KETOROLAC TROMETHAMINE 15 MG/ML IJ SOLN
15.0000 mg | Freq: Three times a day (TID) | INTRAMUSCULAR | Status: DC | PRN
  Administered 2019-04-01 – 2019-04-02 (×3): 15 mg via INTRAVENOUS
  Filled 2019-04-01 (×4): qty 1

## 2019-04-01 MED ORDER — SODIUM CHLORIDE 0.9 % IV SOLN
INTRAVENOUS | Status: DC | PRN
  Administered 2019-04-01: 25 ug/min via INTRAVENOUS

## 2019-04-01 MED ORDER — FENTANYL CITRATE (PF) 250 MCG/5ML IJ SOLN
INTRAMUSCULAR | Status: AC
  Filled 2019-04-01: qty 5

## 2019-04-01 MED ORDER — PHENYLEPHRINE 40 MCG/ML (10ML) SYRINGE FOR IV PUSH (FOR BLOOD PRESSURE SUPPORT)
PREFILLED_SYRINGE | INTRAVENOUS | Status: DC | PRN
  Administered 2019-04-01: 160 ug via INTRAVENOUS
  Administered 2019-04-01 (×2): 120 ug via INTRAVENOUS

## 2019-04-01 MED ORDER — OXYCODONE HCL 5 MG PO TABS
ORAL_TABLET | ORAL | Status: AC
  Filled 2019-04-01: qty 1

## 2019-04-01 MED ORDER — ONDANSETRON HCL 4 MG/2ML IJ SOLN
INTRAMUSCULAR | Status: DC | PRN
  Administered 2019-04-01: 4 mg via INTRAVENOUS

## 2019-04-01 MED ORDER — PROPOFOL 500 MG/50ML IV EMUL
INTRAVENOUS | Status: DC | PRN
  Administered 2019-04-01: 25 ug/kg/min via INTRAVENOUS

## 2019-04-01 MED ORDER — PHENYLEPHRINE 40 MCG/ML (10ML) SYRINGE FOR IV PUSH (FOR BLOOD PRESSURE SUPPORT)
PREFILLED_SYRINGE | INTRAVENOUS | Status: AC
  Filled 2019-04-01: qty 10

## 2019-04-01 MED ORDER — FENTANYL CITRATE (PF) 250 MCG/5ML IJ SOLN
INTRAMUSCULAR | Status: DC | PRN
  Administered 2019-04-01 (×3): 50 ug via INTRAVENOUS

## 2019-04-01 MED ORDER — 0.9 % SODIUM CHLORIDE (POUR BTL) OPTIME
TOPICAL | Status: DC | PRN
  Administered 2019-04-01: 14:00:00 1000 mL

## 2019-04-01 MED ORDER — FENTANYL CITRATE (PF) 100 MCG/2ML IJ SOLN: 25.0000 ug | INTRAMUSCULAR | Status: DC | PRN

## 2019-04-01 MED ORDER — LACTATED RINGERS IV SOLN
INTRAVENOUS | Status: DC
  Administered 2019-04-01: 13:00:00 via INTRAVENOUS

## 2019-04-01 MED ORDER — OXYCODONE HCL 5 MG/5ML PO SOLN: 5.0000 mg | Freq: Once | ORAL | Status: AC | PRN

## 2019-04-01 MED ORDER — ACETAMINOPHEN 160 MG/5ML PO SOLN: 1000.0000 mg | Freq: Once | ORAL | Status: DC | PRN

## 2019-04-01 MED ORDER — LIDOCAINE HCL (PF) 1 % IJ SOLN
INTRAMUSCULAR | Status: AC
  Filled 2019-04-01: qty 30

## 2019-04-01 MED ORDER — ONDANSETRON HCL 4 MG/2ML IJ SOLN
INTRAMUSCULAR | Status: AC
  Filled 2019-04-01: qty 2

## 2019-04-01 MED ORDER — ACETAMINOPHEN 500 MG PO TABS: 1000.0000 mg | ORAL_TABLET | Freq: Once | ORAL | Status: DC | PRN

## 2019-04-01 MED ORDER — CHLORHEXIDINE GLUCONATE CLOTH 2 % EX PADS
6.0000 | MEDICATED_PAD | Freq: Every day | CUTANEOUS | Status: DC
  Administered 2019-04-01 – 2019-04-03 (×3): 6 via TOPICAL

## 2019-04-01 MED ORDER — ALBUMIN HUMAN 5 % IV SOLN
INTRAVENOUS | Status: DC | PRN
  Administered 2019-04-01 (×2): via INTRAVENOUS

## 2019-04-01 MED ORDER — OXYCODONE HCL 5 MG PO TABS
5.0000 mg | ORAL_TABLET | Freq: Once | ORAL | Status: AC | PRN
  Administered 2019-04-01: 5 mg via ORAL

## 2019-04-01 MED ORDER — LIDOCAINE HCL 1 % IJ SOLN
INTRAMUSCULAR | Status: DC | PRN
  Administered 2019-04-01: 10 mL

## 2019-04-01 MED ORDER — MIDAZOLAM HCL 2 MG/2ML IJ SOLN
INTRAMUSCULAR | Status: DC | PRN
  Administered 2019-04-01: 2 mg via INTRAVENOUS

## 2019-04-01 MED ORDER — PREDNISOLONE ACETATE 1 % OP SUSP
1.0000 [drp] | Freq: Four times a day (QID) | OPHTHALMIC | Status: DC
  Administered 2019-04-01 – 2019-04-04 (×14): 1 [drp] via OPHTHALMIC
  Filled 2019-04-01: qty 5

## 2019-04-01 MED ORDER — MIDAZOLAM HCL 2 MG/2ML IJ SOLN
INTRAMUSCULAR | Status: AC
  Filled 2019-04-01: qty 2

## 2019-04-01 MED ORDER — RAMELTEON 8 MG PO TABS
8.0000 mg | ORAL_TABLET | Freq: Every day | ORAL | Status: DC
  Administered 2019-04-01 – 2019-04-03 (×3): 8 mg via ORAL
  Filled 2019-04-01 (×5): qty 1

## 2019-04-01 MED ORDER — ACETAMINOPHEN 10 MG/ML IV SOLN: 1000.0000 mg | Freq: Once | INTRAVENOUS | Status: DC | PRN

## 2019-04-01 SURGICAL SUPPLY — 32 items
ADH SKN CLS APL DERMABOND .7 (GAUZE/BANDAGES/DRESSINGS) ×1
BLADE CLIPPER SURG (BLADE) ×3 IMPLANT
CANISTER SUCT 3000ML PPV (MISCELLANEOUS) ×3 IMPLANT
COVER SURGICAL LIGHT HANDLE (MISCELLANEOUS) ×3 IMPLANT
COVER WAND RF STERILE (DRAPES) ×3 IMPLANT
DERMABOND ADVANCED (GAUZE/BANDAGES/DRESSINGS) ×2
DERMABOND ADVANCED .7 DNX12 (GAUZE/BANDAGES/DRESSINGS) ×1 IMPLANT
DRAPE C-ARM 42X72 X-RAY (DRAPES) ×3 IMPLANT
DRAPE LAPAROSCOPIC ABDOMINAL (DRAPES) ×3 IMPLANT
ELECT REM PT RETURN 9FT ADLT (ELECTROSURGICAL) ×3
ELECTRODE REM PT RTRN 9FT ADLT (ELECTROSURGICAL) ×1 IMPLANT
GLOVE BIO SURGEON STRL SZ7.5 (GLOVE) ×6 IMPLANT
GOWN STRL REUS W/ TWL LRG LVL3 (GOWN DISPOSABLE) ×2 IMPLANT
GOWN STRL REUS W/TWL LRG LVL3 (GOWN DISPOSABLE) ×6
KIT BASIN OR (CUSTOM PROCEDURE TRAY) ×3 IMPLANT
KIT PLEURX DRAIN CATH 1000ML (MISCELLANEOUS) ×9 IMPLANT
KIT PLEURX DRAIN CATH 15.5FR (DRAIN) ×3 IMPLANT
KIT TURNOVER KIT B (KITS) ×3 IMPLANT
NDL HYPO 25GX1X1/2 BEV (NEEDLE) ×1 IMPLANT
NEEDLE HYPO 25GX1X1/2 BEV (NEEDLE) ×3 IMPLANT
NS IRRIG 1000ML POUR BTL (IV SOLUTION) ×3 IMPLANT
PACK GENERAL/GYN (CUSTOM PROCEDURE TRAY) ×3 IMPLANT
PAD ARMBOARD 7.5X6 YLW CONV (MISCELLANEOUS) ×6 IMPLANT
SET DRAINAGE LINE (MISCELLANEOUS) IMPLANT
SUT ETHILON 3 0 PS 1 (SUTURE) ×3 IMPLANT
SUT SILK 2 0 SH (SUTURE) ×3 IMPLANT
SUT VIC AB 3-0 SH 8-18 (SUTURE) ×3 IMPLANT
SYR CONTROL 10ML LL (SYRINGE) ×3 IMPLANT
TOWEL GREEN STERILE (TOWEL DISPOSABLE) ×3 IMPLANT
TOWEL GREEN STERILE FF (TOWEL DISPOSABLE) ×3 IMPLANT
VALVE REPLACEMENT CAP (MISCELLANEOUS) IMPLANT
WATER STERILE IRR 1000ML POUR (IV SOLUTION) ×3 IMPLANT

## 2019-04-01 NOTE — Progress Notes (Signed)
OT Cancellation Note  Patient Details Name: Kristen Chavez MRN: [id number] DOB: 1950/09/12   Cancelled Treatment:    Reason Eval/Treat Not Completed: Patient at procedure or test/ unavailable- off unit at procedure.   Delight Stare, OT Acute Rehabilitation Services Pager [phone] Office [phone]   Delight Stare 04/01/2019, 1:47 PM

## 2019-04-01 NOTE — Brief Op Note (Signed)
04/01/2019  2:20 PM  PATIENT:  Kristen Chavez  68 y.o. female  PRE-OPERATIVE DIAGNOSIS:  right lung cancer with recurrent right pleural effusion  POST-OPERATIVE DIAGNOSIS:  right lung cancer with recurrent right pleural effusion  PROCEDURE:  Procedure(s): INSERTION PLEURAL DRAINAGE CATHETER (Right)  Drainage 2.5 L right pleural effusion  SURGEON:  Surgeon(s) and Role:    [name], MD - Primary  PHYSICIAN ASSISTANT: none  ASSISTANTS: none   ANESTHESIA:   IV sedation, local lidocaine  EBL:  45 mL   BLOOD ADMINISTERED:none  DRAINS: right pleurx catheter   LOCAL MEDICATIONS USED:  LIDOCAINE  and Amount: 8 ml  SPECIMEN:  No Specimen  DISPOSITION OF SPECIMEN:  N/A  COUNTS:  YES  TOURNIQUET:  * No tourniquets in log *  DICTATION: .Dragon Dictation  PLAN OF CARE: return to 3 E  PATIENT DISPOSITION:  PACU - hemodynamically stable.   Delay start of Pharmacological VTE agent (>24hrs) due to surgical blood loss or risk of bleeding: yes - 24 hours no lovenox

## 2019-04-01 NOTE — Anesthesia Procedure Notes (Signed)
Procedure Name: MAC Date/Time: 04/01/2019 1:29 PM Performed by: Kathryne Hitch, CRNA Pre-anesthesia Checklist: Patient identified, Emergency Drugs available, Suction available and Patient being monitored Oxygen Delivery Method: Simple face mask Placement Confirmation: positive ETCO2

## 2019-04-01 NOTE — Progress Notes (Signed)
Pre Procedure note for inpatients:   Kristen Chavez has been scheduled for Procedure(s): INSERTION PLEURAL DRAINAGE CATHETER (Right) today. The various methods of treatment have been discussed with the patient. After consideration of the risks, benefits and treatment options the patient has consented to the planned procedure.   The patient has been seen and labs reviewed. There are no changes in the patient's condition to prevent proceeding with the planned procedure today.  Recent labs:  Lab Results  Component Value Date   WBC 10.2 04/01/2019   HGB 9.7 (L) 04/01/2019   HCT 31.0 (L) 04/01/2019   PLT 534 (H) 04/01/2019   GLUCOSE 128 (H) 04/01/2019   CHOL 166 02/10/2017   TRIG 68 02/10/2017   HDL 84 02/10/2017   LDLDIRECT 51 04/15/2010   LDLCALC 68 02/10/2017   ALT 20 03/26/2019   AST 21 03/26/2019   NA 137 04/01/2019   K 4.2 04/01/2019   CL 102 04/01/2019   CREATININE 1.11 (H) 04/01/2019   BUN 13 04/01/2019   CO2 25 04/01/2019   TSH 0.640 12/11/2017   INR 1.1 04/01/2019   HGBA1C 5.4 02/10/2017    Len Childs, MD 04/01/2019 12:51 PM

## 2019-04-01 NOTE — Telephone Encounter (Signed)
A hospital follow up appt has been scheduled for [name] to see Dr. Irene Limbo on 10/7 at 11am. Pt is currently still in the hospital. Letter mailed.

## 2019-04-01 NOTE — Progress Notes (Signed)
Patient's MEWs was 2-  Pt RR and HR elevated.  IS not an acute change.  Patient going for procedure to place pluerex cath. Due to fluid on lungs

## 2019-04-01 NOTE — Progress Notes (Signed)
   Subjective: Patient was seen at bedside this morning. Accompanied in room by sister. Patient stated that her right hip pain that occurred over night had resolved with medication. Denied pain elsewhere. Patient stated that she was having increased trouble breathing.   Objective:  Vital signs in last 24 hours: Vitals:   04/01/19 0550 04/01/19 0557 04/01/19 0804 04/01/19 1229  BP: 101/75  104/77 108/87  Pulse: (!) 113  (!) 108 (!) 109  Resp: 16  (!) 23 20  Temp: 97.9 F (36.6 C)  98.9 F (37.2 C) 98.6 F (37 C)  TempSrc: Oral  Oral Oral  SpO2: 98%  100% 100%  Weight:  62.4 kg    Height:       Weight change:   Intake/Output Summary (Last 24 hours) at 04/01/2019 1306 Last data filed at 04/01/2019 0818 Gross per 24 hour  Intake 0 ml  Output -  Net 0 ml   Physical Exam  Constitutional: She appears well-developed and well-nourished.  HENT:  Head: Normocephalic and atraumatic.  Eyes: EOM are normal.  Cardiovascular: Regular rhythm and normal heart sounds. Tachycardia present.  Pulmonary/Chest: She is in respiratory distress. She has decreased breath sounds in the right upper field, the right middle field and the right lower field.  Abdominal: Soft. Bowel sounds are normal. There is no abdominal tenderness. There is no rebound and no guarding.  Musculoskeletal: Normal range of motion.  Neurological: She is alert.  Patient answered questions appropriately.   Skin: Skin is warm and dry.  Psychiatric: She has a normal mood and affect. Her speech is normal and behavior is normal. Thought content normal.   Assessment/Plan: Kristen Chavez is a 68 y.o. female on hospital day 6 with history of HTN, OA, and depression and was found to have right upper lobe malignancy, possible bladder cancer and right pleural effusion.   Active Problems:   Mass of upper lobe of right lung   Adenocarcinoma (HCC)   Pleural effusion, malignant  Adenocarcinoma of the right lung:  CXR yesterday showed  no change in pleural masses noted at the right apex from prior exam. Left lung remains clear, however hyperexpanded. Patient reports increased work of breathing today, though this is likely due to the increased pleural effusion. *Repeat CXR after insertion of right thoracic drainage catheter today.  *Follow-up with Dr. Kale with oncology as outpatient in two weeks *Possible PDL1 and foundation 1 testing is being determined via pathology *Refer to urology at [facility] as outpatient to assess possible malignancy in bladder.  Pleural effusion, malignant:  CXR yesterday showed opacification of most of the right hemithorax due to pleural effusion stemming from adenocarcinoma. Patient is scheduled for insertion of a right thoracic drainage catheter today.  *Reassess patient after procedure. *Continue Atarax 25 mg q4h prn.  Anemia: CBC yesterday afternoon showed slight decrease in RBC (3.78) however stable form day prior (3.76). HCT is slight decreased, but elevated at 31.0 when compared to day prior (30.3). Hemoglobin decreased at 9.7 and decreased from day prior (10.1).  *Follow-up with pcp as outpatient  HTN: Patient presented on home medications lisinopril and hydrochlorothiazide and continued on lisinopril as inpatient. Patient blood pressure this afternoon continues to be low at 108/87. *Continue lisinopril *Dicharge with both lisinopril and hydrochlorothiazide *Follow-up with PCP as outpatient   LOS: 5 days   ,  D, Medical Student 04/01/2019, 1:06 PM 

## 2019-04-01 NOTE — Op Note (Signed)
NAME: Kristen Chavez, SEDAM MEDICAL RECORD KN:[id number] ACCOUNT [id number] DATE OF BIRTH:[birth year] FACILITY: [facility] LOCATION: [facility] PHYSICIAN:PETER VAN TRIGT III, MD  OPERATIVE REPORT  DATE OF PROCEDURE:  04/01/2019  OPERATION:  Placement of right PleurX catheter with drainage of 2.5 liters of serosanguineous pleural fluid.  SURGEON:  [name], MD  PREOPERATIVE DIAGNOSIS:  Recurrent malignant right pleural effusion, history of lung cancer.  POSTOPERATIVE DIAGNOSIS:  Recurrent malignant right pleural effusion, history of lung cancer.  ANESTHESIA:  IV conscious monitored sedation and local 1% lidocaine.  DESCRIPTION OF PROCEDURE:  Informed consent was documented in preoperative holding after the patient was examined and the most recent chest x-ray reviewed.  She had had recent history of stage IV lung cancer with malignant right pleural effusion, which  recurred after thoracentesis.  She had agreed to proceed with a PleurX catheter placement, which was recommended by her physicians.  The patient was placed supine on the operating table and positioned with a slight soft roll under her right side.  Previously, she had been marked on the right shoulder for the proper site.  She was given IV monitored conscious sedation by the anesthesia  team.  The right chest was prepped and draped as a sterile field.  A proper time-out was performed.  Then, 1% lidocaine was infiltrated in the right inframammary crease at the anterior axillary line as well as at the right costal margin.  Two small  incisions were made through the upper incision using the Seldinger technique.  A guidewire was passed into the pleural space and confirmed with C-arm fluoroscopy.  Next, the PleurX catheter was tunneled from the lower incision to the upper incision.  Next, over the guidewire, a dilator then dilator sheath system was inserted into the pleural space.  There were large amounts of serosanguineous fluid, which  drained.   Next, over the guidewire, a dilator sheath was inserted.  Through the tear-away sheath, the PleurX catheter was then advanced into the pleural space.  It was then connected to a suction canister 3 times and a total of 2.5 liters of serosanguineous fluid  was removed.  The upper incision was closed in layers using 3-0 Vicryl for the subcutaneous layers and 3-0 nylon for the skin.  The lower incision was closed with a silk suture used to secure the catheter to the skin.  A sterile dressing was placed over the catheter  after it was capped following the drainage.  The patient then returned to the recovery room in stable condition.  TN/NUANCE  D:04/01/2019 T:04/01/2019 JOB:[id number]

## 2019-04-01 NOTE — Progress Notes (Signed)
PT Cancellation Note  Patient Details Name: Kristen Chavez MRN: [id number] DOB: 31-Oct-1950   Cancelled Treatment:    Reason Eval/Treat Not Completed: Patient declined, no reason specified(pt awaiting pleurex catheter placement and politely declined defer therapy until after drain placed.)   Brailee Riede B Olamide Carattini 04/01/2019, 11:22 AM Elwyn Reach, PT Acute Rehabilitation Services Pager: [phone] Office: [phone]

## 2019-04-01 NOTE — Anesthesia Preprocedure Evaluation (Signed)
Anesthesia Evaluation  Patient identified by MRN, date of birth, ID band Patient awake    Reviewed: Allergy & Precautions, NPO status , Patient's Chart, lab work & pertinent test results  History of Anesthesia Complications Negative for: history of anesthetic complications  Airway Mallampati: II  TM Distance: >3 FB Neck ROM: Full    Dental  (+) Dental Advisory Given, Missing   Pulmonary shortness of breath, neg recent URI, former smoker,     + decreased breath sounds      Cardiovascular hypertension, Pt. on medications (-) angina(-) Past MI and (-) CHF  Rhythm:Regular     Neuro/Psych PSYCHIATRIC DISORDERS Anxiety Depression negative neurological ROS     GI/Hepatic negative GI ROS, Neg liver ROS,   Endo/Other  negative endocrine ROS  Renal/GU negative Renal ROS     Musculoskeletal  (+) Arthritis ,   Abdominal   Peds  Hematology  (+) anemia ,   Anesthesia Other Findings   Reproductive/Obstetrics                             Anesthesia Physical Anesthesia Plan  ASA: III  Anesthesia Plan: MAC   Post-op Pain Management:    Induction: Intravenous  PONV Risk Score and Plan: 2 and Treatment may vary due to age or medical condition and Propofol infusion  Airway Management Planned: Nasal Cannula  Additional Equipment: None  Intra-op Plan:   Post-operative Plan:   Informed Consent: I have reviewed the patients History and Physical, chart, labs and discussed the procedure including the risks, benefits and alternatives for the proposed anesthesia with the patient or authorized representative who has indicated his/her understanding and acceptance.     Dental advisory given  Plan Discussed with: CRNA and Surgeon  Anesthesia Plan Comments:         Anesthesia Quick Evaluation

## 2019-04-01 NOTE — Transfer of Care (Signed)
Immediate Anesthesia Transfer of Care Note  Patient: Kristen Chavez  Procedure(s) Performed: INSERTION PLEURAL DRAINAGE CATHETER (Right Thoracic)  Patient Location: PACU  Anesthesia Type:MAC  Level of Consciousness: awake, alert , oriented and patient cooperative  Airway & Oxygen Therapy: Patient Spontanous Breathing and Patient connected to nasal cannula oxygen  Post-op Assessment: Report given to RN and Post -op Vital signs reviewed and stable  Post vital signs: Reviewed and stable  Last Vitals:  Vitals Value Taken Time  BP 101/70 04/01/19 1500  Temp    Pulse 99 04/01/19 1500  Resp 27 04/01/19 1502  SpO2 100 % 04/01/19 1500  Vitals shown include unvalidated device data.  Last Pain:  Vitals:   04/01/19 1229  TempSrc: Oral  PainSc:          Complications: No apparent anesthesia complications

## 2019-04-01 NOTE — Plan of Care (Signed)

## 2019-04-01 NOTE — Progress Notes (Signed)
Patient returned from procedure - A &O x4, some slight discomfort at catheter site, but otherwise doing well.  Pt reports does not feel as SOB.

## 2019-04-02 ENCOUNTER — Inpatient Hospital Stay (HOSPITAL_COMMUNITY): Payer: Medicare Other

## 2019-04-02 ENCOUNTER — Encounter (HOSPITAL_COMMUNITY): Payer: Self-pay | Admitting: Cardiothoracic Surgery

## 2019-04-02 LAB — BASIC METABOLIC PANEL
Anion gap: 8 (ref 5–15)
BUN: 12 mg/dL (ref 8–23)
CO2: 22 mmol/L (ref 22–32)
Calcium: 8.1 mg/dL — ABNORMAL LOW (ref 8.9–10.3)
Chloride: 106 mmol/L (ref 98–111)
Creatinine, Ser: 1.04 mg/dL — ABNORMAL HIGH (ref 0.44–1.00)
GFR calc Af Amer: >60 mL/min (ref >60)
GFR calc non Af Amer: 54 mL/min — ABNORMAL LOW (ref >60)
Glucose, Bld: 101 mg/dL — ABNORMAL HIGH (ref 70–99)
Potassium: 4.7 mmol/L (ref 3.5–5.1)
Sodium: 136 mmol/L (ref 135–145)

## 2019-04-02 LAB — CBC
HCT: 22.8 % — ABNORMAL LOW (ref 36.0–46.0)
HCT: 22.2 % — ABNORMAL LOW (ref 36.0–46.0)
Hemoglobin: 7.5 g/dL — ABNORMAL LOW (ref 12.0–15.0)
Hemoglobin: 7.3 g/dL — ABNORMAL LOW (ref 12.0–15.0)
MCH: 26.6 pg (ref 26.0–34.0)
MCH: 26.4 pg (ref 26.0–34.0)
MCHC: 32.9 g/dL (ref 30.0–36.0)
MCHC: 32.9 g/dL (ref 30.0–36.0)
MCV: 80.9 fL (ref 80.0–100.0)
MCV: 80.1 fL (ref 80.0–100.0)
Platelets: 401 10*3/uL — ABNORMAL HIGH (ref 150–400)
Platelets: 429 10*3/uL — ABNORMAL HIGH (ref 150–400)
RBC: 2.82 MIL/uL — ABNORMAL LOW (ref 3.87–5.11)
RBC: 2.77 MIL/uL — ABNORMAL LOW (ref 3.87–5.11)
RDW: 14.5 % (ref 11.5–15.5)
RDW: 14.6 % (ref 11.5–15.5)
WBC: 7.5 10*3/uL (ref 4.0–10.5)
WBC: 8.8 10*3/uL (ref 4.0–10.5)
nRBC: 0 % (ref 0.0–0.2)
nRBC: 0 % (ref 0.0–0.2)

## 2019-04-02 MED ORDER — ENSURE ENLIVE PO LIQD
237.0000 mL | Freq: Three times a day (TID) | ORAL | Status: DC
  Administered 2019-04-04: 237 mL via ORAL

## 2019-04-02 NOTE — TOC Progression Note (Addendum)
Transition of Care [facility]) - Progression Note    Patient Details  Name: Kristen Chavez MRN: [id number] Date of Birth: 1951/07/10  Transition of Care [facility]) CM/SW Contact  Zenon Mayo, RN Phone Number: 04/02/2019, 4:56 PM  Clinical Narrative:    Patient will need HHRN for pleurx drain, NCM offered choice, she chose [facility] home care, NCM called [place] [facility] care but they had already left for the day, the rep states she will call me in the am.  Patient has a walker in the room.  Patient has the pluerx box of 10 in her room.   9/23 [place], BSN- NCM spoke with Arbie Cookey at [facility], she states unfortunately they do not have the staff to take referral.  NCM informed patient and she states she will go with Alvis Lemmings,  [place] contacted [facility] with Alvis Lemmings , waiting for call back. Tommi Rumps called back and stated they can take referral.    Expected Discharge Plan: [place] Barriers to Discharge: Continued Medical Work up  Expected Discharge Plan and Services Expected Discharge Plan: [place] arrangements for the past 2 months: Single Family Home Expected Discharge Date: 03/30/19               DME Arranged: Gilford Rile rolling DME Agency: [employer] Date DME Agency Contacted: 03/29/19 Time DME Agency Contacted: [phone] Representative spoke with at DME Agency: [place]: OT, PT [place] Agency: [place] Date [place]: 03/29/19 Time [place]: 1432 Representative spoke with at [place]: [place] ([place]) Interventions    Readmission Risk Interventions No flowsheet data found.

## 2019-04-02 NOTE — Progress Notes (Signed)
 Subjective: Patient was seen this morning at bedside. Patient stated that she was able to breathe much easier since the procedure, but was still having increased work of breathing compared to her baseline. Patient noted soreness at insertion site, but denies any other pain. Patient noted weakness when getting up to use the bathroom but denies headache, lightheadedness, dizziness, or vision changes. Discussed with patient her CBC findings that are concerning for anemia and that her symptoms are likely related. Discussed possibility of blood transfusion should her hemoglobin drop lower and patient was hesitant to accept blood products as she was raised as a Jehovah's Witness.   Objective:  Vital signs in last 24 hours: Vitals:   04/01/19 1559 04/01/19 1617 04/01/19 2138 04/02/19 0645  BP: 103/77 97/73 103/70 94/77  Pulse: 98 100 (!) 110 94  Resp: 18 20 16 16  Temp: 98.4 F (36.9 C) 98.5 F (36.9 C) 99.8 F (37.7 C) 98.5 F (36.9 C)  TempSrc:  Oral Oral Oral  SpO2: 98% 100% 95% 100%  Weight:    63.1 kg  Height:       Weight change: 0.685 kg  Intake/Output Summary (Last 24 hours) at 04/02/2019 1129 Last data filed at 04/02/2019 0400 Gross per 24 hour  Intake 1554.5 ml  Output 2945 ml  Net -1390.5 ml   Physical Exam  Constitutional: She appears well-developed and well-nourished. No distress.  HENT:  Head: Normocephalic and atraumatic.  Eyes: Conjunctivae and EOM are normal. Right eye exhibits no discharge. Left eye exhibits no discharge.  Neck: Normal range of motion. Neck supple.  Cardiovascular: Regular rhythm and intact distal pulses. Tachycardia present.  Pulmonary/Chest: She has decreased breath sounds in the right upper field, the right middle field and the right lower field.  Effort increased when leaning forward, however overall decreased from yesterday.  Injection site was non-infected and non-concerning. Healing normally.   Abdominal: Soft. Bowel sounds are normal. She  exhibits no distension. There is no abdominal tenderness. There is no rebound and no guarding.  Musculoskeletal: Normal range of motion.        General: No edema.  Neurological: She is alert.  Patient answered questions appropriately.   Skin: Skin is warm and dry.  Psychiatric: She has a normal mood and affect. Her speech is normal and behavior is normal.   Assessment/Plan: Kristen Chavez is a 68 y.o. female on hospital day 7 with history of HTN, OA, and depression and was found to have right upper lobe malignancy, possible bladder cancer and right pleural effusion that is being managed with a right PleurX drainage catheter.   Active Problems:   Mass of upper lobe of right lung   Adenocarcinoma (HCC)   Pleural effusion, malignant  Adenocarcinoma of the right lung: CXR today showed increasing opacity within the right lung. Mostly likely due to atelectasis versus infection as patient is afebrile and has a normal WBC. As the left lung remains clear, changes are most likely secondary to adenocarcinoma.  *Follow-up with Dr. Kale as outpatient on 10/7 at 11 am.  *Possible PDL1 and foundation 1 testing is being determined via pathology *Refer to urology at [facility] as outpatient to assess possible malignancy in bladder.  Pleural effusion, malignant: Right PleurX drainage catheter was placed successfully yesterday with removal of 2.5 liters of serosanguinous fluid. CXR yesterday after procedure showed near complete resolution of right-sided pleural effusion, and no pneumothorax. Lobulated soft tissue in the right hilar region likely corresponds to adenopathy   on prior CT. Peripheral opacities at the right apex and right lateral lung may represent trapped pleural fluid or soft tissue implants. CXR today showed persistent small right pleural effusion status post pleural drainage catheter placement. No pneumothorax. Patient reported decreased work of breathing after procedure, however was  still not at baseline. Physical therapy noted that patient was able to maintain SpO2 97% on 2L while ambulatory, however patient remained at 92% on room air. Patient seems to still have increased work of breathing due to residual pleural effusion. Supplemental oxygen and home health PT should help patient maintain quality of life.   *Home health PT;Supervision for mobility/OOB *Discharge with rolling walker with 5" wheels  *Education and assistance with Pleurx catheter care prior to discharge.  Anemia: Hemoglobin remains low at 7.3, slightly down from 7.5 around 10 hours earlier. RBC remains low at 2.77, slightly down from 2.82. HCT remains low at 22.2, slightly down from 22.8 earlier. Patient noted weakness when getting up to use the bathroom this morning that is likely tied to her anemia. As the largest drop in hemoglobin was during the time period of her procedure, the anemia is most likely tied to blood loss during placement of the catheter. Examination of the injection site this morning was non-concerning. Her hemoglobin, RBC, and HCT remaining stable for the past 10 hours rules out rapid and substantial loss of blood via bleeding into the catheter. Considering serosanguinous pleural fluid and Light's criteria met for exudate, blood loss more likely due to vascular leakage secondary to malignancy. Considering that her hemoglobin is still above 7.0, and that she is following up with Dr. Kale in two weeks, she may be safe to discharge with iron supplements and potentially an erythropoiesis-stimulating-agent within a few days. *Initiate ferrous sulfate 325 mg tablet 3x a day.   *Discharge provided hemoglobin remains over 7.  *Follow-up with Dr. Kale in two weeks.  HTN:  Patient presented on home medications lisinopril and hydrochlorothiazide and continued on lisinopril as inpatient. Inpatient blood pressures continue to be low with bp of 107/73 today. *Continue lisinopril *Dicharge with both  lisinopril and hydrochlorothiazide *Follow-up with PCP as outpatient   LOS: 6 days   ,  D, Medical Student 04/02/2019, 11:29 AM 

## 2019-04-02 NOTE — Progress Notes (Signed)
Occupational Therapy Treatment Patient Details Name: Kristen Chavez MRN: [id number] DOB: 02-26-1951 Today's Date: 04/02/2019    History of present illness 68 yo female admitted with chest pain and fatigue found to have Rt upper lobe mass s/p thoracentesis with concern for malignancy, pleurex catheter placed 9/21. PMHx: HTN, anxiety, depression   OT comments  Pt making steady progress towards OT goals this session. Pt MOD I for bed mobility; supervision for sit>stand from EOB with RW. Trialed pt on RA during functional mobility from EOB>sink. Pt MIN guard for functional mobility to sink. Pt complete standing grooming at sink for ~ 1 minute before pt becoming heavily fatigued requesting to sit down. Pt de-sat into 80s during functional mobility- donned [place] at 2L with sats rebounding back to 97. Handout provided on energy conservation strategies to carryover at home. Pt receptive to education and appreciative of suggestions. Pt became emotional at end of session about CLOF; emotional support provided. Updated equipment recommendations as pt agreeable to using 3n1- will let OTR know about change in POC. DC plan remains appropriate. Will continue to follow for acute OT needs.   Follow Up Recommendations  Home health OT    Equipment Recommendations  3 in 1 bedside commode    Recommendations for Other Services      Precautions / Restrictions Precautions Precautions: Fall Precaution Comments: watch sats Restrictions Weight Bearing Restrictions: No       Mobility Bed Mobility Overal bed mobility: Modified Independent             General bed mobility comments: use of bed features and elevated HOB  Transfers Overall transfer level: Needs assistance Equipment used: Rolling walker (2 wheeled) Transfers: Sit to/from Stand Sit to Stand: Supervision         General transfer comment: sit>stand with supervision for safety    Balance Overall balance assessment: Mild deficits observed,  not formally tested Sitting-balance support: No upper extremity supported;Feet supported Sitting balance-Leahy Scale: Good     Standing balance support: Single extremity supported;Bilateral upper extremity supported Standing balance-Leahy Scale: Fair Standing balance comment: able to stand without UE support, benefits from RW for gait                           ADL either performed or assessed with clinical judgement   ADL Overall ADL's : Needs assistance/impaired     Grooming: Wash/dry hands;Standing Grooming Details (indicate cue type and reason): MIN guard with RW; sats dropped with mobility; pt becoming overly fatigued needing to sit             Lower Body Dressing: Supervision/safety;Sit to/from stand Lower Body Dressing Details (indicate cue type and reason): able to pull up socks from EOB Toilet Transfer: Min Marine scientist Details (indicate cue type and reason): simualted toilet transfer from EOB         Functional mobility during ADLs: Min guard;Rolling walker General ADL Comments: provided pt with written handout of energy conservation strategies to implement at home; pt complete standing grooming at sink with MIN guard and RW for ~1 minute until pt de-sat with sats in 80s requesting to sit down     Vision Baseline Vision/History: Wears glasses     Perception     Praxis      Cognition Arousal/Alertness: Awake/alert Behavior During Therapy: WFL for tasks assessed/performed Overall Cognitive Status: Within Functional Limits for tasks assessed  General Comments: tearful during session about CLOF        Exercises     Shoulder Instructions       General Comments      Pertinent Vitals/ Pain       Pain Assessment: No/denies pain Pain Score: 6  Pain Location: right chest Pain Descriptors / Indicators: Sore;Aching;Guarding Pain Intervention(s): Limited activity within  patient's tolerance;Repositioned;Monitored during session  Home Living                                          Prior Functioning/Environment              Frequency  Min 2X/week        Progress Toward Goals  OT Goals(current goals can now be found in the care plan section)     ADL Goals Pt Will Perform Grooming: with modified independence;standing Pt Will Perform Lower Body Dressing: with modified independence;sit to/from stand;with adaptive equipment Pt Will Perform Tub/Shower Transfer: Tub transfer;with modified independence;shower seat;ambulating Additional ADL Goal #1: Pt will demonstrate use of 3 energy conservation techniques during ADL routine with modified indepedence in order to maximize independence and safety with ADLs.  Plan      Co-evaluation                 AM-PAC OT "6 Clicks" Daily Activity     Outcome Measure   Help from another person eating meals?: None Help from another person taking care of personal grooming?: A Little Help from another person toileting, which includes using toliet, bedpan, or urinal?: A Little Help from another person bathing (including washing, rinsing, drying)?: A Little Help from another person to put on and taking off regular upper body clothing?: A Little Help from another person to put on and taking off regular lower body clothing?: A Little 6 Click Score: 19    End of Session Equipment Utilized During Treatment: Rolling walker  OT Visit Diagnosis: Unsteadiness on feet (R26.81);Muscle weakness (generalized) (M62.81)   Activity Tolerance Patient limited by fatigue   Patient Left in bed;with call bell/phone within reach   Nurse Communication Mobility status;Other (comment)(de-sated on RA- placed back on 2 L [place])        Time: 0981-1914 OT Time Calculation (min): 23 min  Charges: OT General Charges $OT Visit: 1 Visit OT Treatments $Self Care/Home Management : 23-37 mins  [place],  COTA/L Acute Rehabilitation Services [phone] [place] 04/02/2019, 3:15 PM

## 2019-04-02 NOTE — Progress Notes (Signed)
Physical Therapy Treatment Patient Details Name: Kristen Chavez MRN: [id number] DOB: [birth year] Today's Date: 04/02/2019    History of Present Illness 68 yo female admitted with chest pain and fatigue found to have Rt upper lobe mass s/p thoracentesis with concern for malignancy, pleurex catheter placed 9/21. PMHx: HTN, anxiety, depression    PT Comments    Pt pleasant and reports feeling better since pleurex placed. Pt on RA on arrival with SpO2 87% with pt returned to 2L for gait and able to maintain 97% with return to RA in room pt 90-92% with RN aware of inability to achieve full sats for ambulatory note.  Pt encouraged to be OOB for meals and to mobilize with staff. Pt reports she tried to get to the bathroom on her own earlier and almost fell. Pt to D/C to daughter's house for additional assist.   Pt expressing sadness over her current medical status and states she is unsure if she wishes to continue treatment but that family does not want her to give up. Listened to pt voice her concerns, frustrations and fears and offered to have chaplain visit which pt declined. Updated RN and paged MD.    Follow Up Recommendations  Home health PT;Supervision for mobility/OOB     Equipment Recommendations  Rolling walker with 5" wheels    Recommendations for Other Services       Precautions / Restrictions Precautions Precautions: Fall Precaution Comments: watch sats    Mobility  Bed Mobility Overal bed mobility: Modified Independent             General bed mobility comments: HOB 30 degrees with pt educated for utilizing left side of bed to ease Rt chest pain as well as splinting on right side  Transfers Overall transfer level: Modified independent               General transfer comment: pt able to stand to/from bed without assist  Ambulation/Gait Ambulation/Gait assistance: Min guard Gait Distance (Feet): 300 Feet Assistive device: Rolling walker (2 wheeled) Gait  Pattern/deviations: Step-through pattern;Decreased stride length   Gait velocity interpretation: >2.62 ft/sec, indicative of community ambulatory General Gait Details: pt with cues for posture and proximity to RW with SpO2 97% on 2L during gait. Pt reports increased comfort and stability with use of RW with education for RW use for stability and activity tolerance but not necessarily needed indefinitely. Pt declined attempting stairs   Stairs Stairs: (pt declined attempting)           Wheelchair Mobility    Modified Rankin (Stroke Patients Only)       Balance Overall balance assessment: Mild deficits observed, not formally tested Sitting-balance support: No upper extremity supported;Feet supported Sitting balance-Leahy Scale: Good     Standing balance support: Single extremity supported;Bilateral upper extremity supported Standing balance-Leahy Scale: Fair Standing balance comment: able to stand without UE support, benefits from RW for gait                            Cognition Arousal/Alertness: Awake/alert Behavior During Therapy: WFL for tasks assessed/performed Overall Cognitive Status: Within Functional Limits for tasks assessed                                        Exercises      General Comments  Pertinent Vitals/Pain Pain Score: 6  Pain Location: right chest Pain Descriptors / Indicators: Sore;Aching;Guarding Pain Intervention(s): Limited activity within patient's tolerance;Repositioned;Monitored during session    Home Living                      Prior Function            PT Goals (current goals can now be found in the care plan section) Progress towards PT goals: Progressing toward goals    Frequency    Min 3X/week      PT Plan Current plan remains appropriate    Co-evaluation              AM-PAC PT "6 Clicks" Mobility   Outcome Measure  Help needed turning from your back to your side  while in a flat bed without using bedrails?: None Help needed moving from lying on your back to sitting on the side of a flat bed without using bedrails?: None Help needed moving to and from a bed to a chair (including a wheelchair)?: None Help needed standing up from a chair using your arms (e.g., wheelchair or bedside chair)?: None Help needed to walk in hospital room?: A Little Help needed climbing 3-5 steps with a railing? : A Little 6 Click Score: 22    End of Session Equipment Utilized During Treatment: Oxygen Activity Tolerance: Patient tolerated treatment well Patient left: in bed;with call bell/phone within reach(pt declined OOB to chair) Nurse Communication: Mobility status PT Visit Diagnosis: Other abnormalities of gait and mobility (R26.89);Muscle weakness (generalized) (M62.81);Unsteadiness on feet (R26.81)     Time: 3762-8315 PT Time Calculation (min) (ACUTE ONLY): 23 min  Charges:  $Gait Training: 8-22 mins $Therapeutic Activity: 8-22 mins                     [place], PT Acute Rehabilitation Services Pager: [phone] Office: [place] 04/02/2019, 1:41 PM

## 2019-04-02 NOTE — Progress Notes (Signed)
Nutrition Follow-up  RD working remotely.  DOCUMENTATION CODES:   Not applicable  INTERVENTION:   -Increase Ensure Enlive po to TID, each supplement provides 350 kcal and 20 grams of protein -Continue MVI with minerals daily  NUTRITION DIAGNOSIS:   Increased nutrient needs related to acute illness(rt lung mass concerning for malignancy) as evidenced by estimated needs.  Ongoing  GOAL:   Patient will meet greater than or equal to 90% of their needs  Progressing   MONITOR:   PO intake, Supplement acceptance, Labs, Weight trends, Skin, I & O's  REASON FOR ASSESSMENT:   Consult, Malnutrition Screening Tool Assessment of nutrition requirement/status  ASSESSMENT:   Kristen Chavez is a 68 y.o. female with history of HTN, OA, and depression who presents with chest pain, dyspnea, fatigue and CT findings of a right upper lobe malignancy.  9/16- rt thoracentesis (1200 ml fluid yielded) 9/21- s/p Procedure(s): INSERTION PLEURAL DRAINAGE CATHETER (Right)  Drainage 2.5 L right pleural effusion  Reviewed I/O's: -1.4 L x 24 hours and +1.7 L since admission  UOP: 400 ml x 24 hours  Pleurex drain: 2.5 L x 24 hours  Per CVTS notes, pt with rt lung cancer with recurrent rt pleural effusion.   Pt's intake has been variable; noted meal completion 25-75%. Per MAR, pt is compliant with her Ensure supplements.   Wt has been stable since admission.   Per CSW notes, plan to d/c home with home health services once medically stable.   Labs reviewed.   Diet Order:   Diet Order            Diet regular Room service appropriate? Yes; Fluid consistency: Thin  Diet effective now        Diet - low sodium heart healthy              EDUCATION NEEDS:   No education needs have been identified at this time  Skin:  Skin Assessment: Skin Integrity Issues: Skin Integrity Issues:: Incisions Incisions: closed rt chest  Last BM:  03/31/19  Height:   Ht Readings from Last 1  Encounters:  04/01/19 5' 2.99" (1.6 m)    Weight:   Wt Readings from Last 1 Encounters:  04/02/19 63.1 kg    Ideal Body Weight:  52.3 kg  BMI:  Body mass index is 24.65 kg/m.  Estimated Nutritional Needs:   Kcal:  1900-2100  Protein:  95-110 grams  Fluid:  > 1.9 L    Dashae Wilcher A. Jimmye Norman, RD, LDN, [place] Registered Dietitian II Certified Diabetes Care and Education Specialist Pager: [phone] After hours Pager: [phone]

## 2019-04-02 NOTE — Care Management Important Message (Signed)
Important Message  Patient Details  Name: Kristen Chavez MRN: [id number] Date of Birth: 06-17-1951   Medicare Important Message Given:  Yes     [name] 04/02/2019, 2:36 PM

## 2019-04-02 NOTE — Progress Notes (Signed)
Pt placed back on 02 Spo2 88% on RA

## 2019-04-03 DIAGNOSIS — Z7189 Other specified counseling: Secondary | ICD-10-CM

## 2019-04-03 DIAGNOSIS — R0602 Shortness of breath: Secondary | ICD-10-CM

## 2019-04-03 DIAGNOSIS — Z515 Encounter for palliative care: Secondary | ICD-10-CM

## 2019-04-03 DIAGNOSIS — Z978 Presence of other specified devices: Secondary | ICD-10-CM

## 2019-04-03 LAB — CBC
HCT: 21.3 % — ABNORMAL LOW (ref 36.0–46.0)
Hemoglobin: 7 g/dL — ABNORMAL LOW (ref 12.0–15.0)
MCH: 26.2 pg (ref 26.0–34.0)
MCHC: 32.9 g/dL (ref 30.0–36.0)
MCV: 79.8 fL — ABNORMAL LOW (ref 80.0–100.0)
Platelets: 422 10*3/uL — ABNORMAL HIGH (ref 150–400)
RBC: 2.67 MIL/uL — ABNORMAL LOW (ref 3.87–5.11)
RDW: 14.6 % (ref 11.5–15.5)
WBC: 7.8 10*3/uL (ref 4.0–10.5)
nRBC: 0 % (ref 0.0–0.2)

## 2019-04-03 LAB — IRON AND TIBC
Iron: 13 ug/dL — ABNORMAL LOW (ref 28–170)
Saturation Ratios: 7 % — ABNORMAL LOW (ref 10.4–31.8)
TIBC: 195 ug/dL — ABNORMAL LOW (ref 250–450)
UIBC: 182 ug/dL

## 2019-04-03 LAB — HEMOGLOBIN AND HEMATOCRIT, BLOOD
HCT: 24.9 % — ABNORMAL LOW (ref 36.0–46.0)
Hemoglobin: 8 g/dL — ABNORMAL LOW (ref 12.0–15.0)

## 2019-04-03 LAB — PREPARE RBC (CROSSMATCH)

## 2019-04-03 LAB — FERRITIN: Ferritin: 185 ng/mL (ref 11–307)

## 2019-04-03 MED ORDER — HYDROXYZINE HCL 25 MG PO TABS
25.0000 mg | ORAL_TABLET | ORAL | Status: DC | PRN
  Administered 2019-04-04: 25 mg via ORAL
  Filled 2019-04-03: qty 1

## 2019-04-03 MED ORDER — ALBUTEROL SULFATE HFA 108 (90 BASE) MCG/ACT IN AERS: 1.0000 | INHALATION_SPRAY | Freq: Four times a day (QID) | RESPIRATORY_TRACT | 1 refills | Status: AC | PRN

## 2019-04-03 MED ORDER — SODIUM CHLORIDE 0.9% IV SOLUTION
Freq: Once | INTRAVENOUS | Status: AC
  Administered 2019-04-03: 17:00:00 via INTRAVENOUS

## 2019-04-03 NOTE — Progress Notes (Addendum)
Patient is alert and orient SOB with Chronic tachy HR, During patient has O2 sat 94%, HR 104 patient states that  she wants to wean off 02 sat 94% at rest on rm

## 2019-04-03 NOTE — Progress Notes (Signed)
Patient is experiencing Chronic Tachycardia, With a yellow [place] score patient is stable with pending discharge.

## 2019-04-03 NOTE — Plan of Care (Signed)

## 2019-04-03 NOTE — Consult Note (Signed)
Consultation Note Date: 04/03/2019   Patient Name: Kristen Chavez  DOB: [birth year]  MRN: [id number]  Age / Sex: 68 y.o., female  PCP: Kristen Pier, MD Referring Physician: Aldine Contes, MD  Reason for Consultation: Establishing goals of care and Psychosocial/spiritual support  HPI/Patient Profile: 68 y.o. female   admitted on 03/26/2019 with past medical  history of HTN, OA, and depression who presents with chest pain, dyspnea, and fatigue.  Patient was experiencing symptoms for the past few months that intensifyed during the past few weeks.  She has had significant dry cough, she feels tired,  fatigued and is unable to carry out activities of daily living.  She reports poor appetite and 30 lb weight loss.   "I knew something was wring but didn't want to know"  CXR was completed and showed right-sided pleural effusion and basilar consolidation, significant increased density in right paratracheal region suspicious for lymphadenopathy and pleural based mass lesions in the right   Patient faces treatment option decisions, advanced directive decisions and anticipatory care needs   Clinical Assessment and Goals of Care:   This NP Kristen Chavez reviewed medical records, received report from team, assessed the patient and then meet at the patient's bedside  to discuss diagnosis, prognosis, GOC,  disposition and options.  Concept/Role of  Palliative Medicine  was discussed with a holistic treatment plan.  Created space and opportunity for patient to explore her thoughts and feelings regarding current medical situation.  Patient tells me she is "overwhelmed", she knew something was wrong but to hear that she has stage IV cancer is "scary".  She is trying to figure out exactly what she wants to do as far as treatment goes, she absolutely is questioning no treatment and allowing a natural course.  We  discussed that she does have a follow-up with oncology and it may be best to gather all the information before she makes such a serious decision.  Ultimately she would hope for continued quality of life but she does not want to "suffer for a few extra bad months"  She is tearful as she verbalizes her fear that she may not be able to participate in her favorite holiday which is Thanksgiving, she also smiles as she tells me that she is the only one that can "really cook it up right"  A  discussion was had today regarding advanced directives.  Concept specific to code status,  was had.  Values and goals of care important to patient and family were attempted to be elicited.   Motional support offered.  I offered to meet Kristen Chavez again in the outpatient setting if she thought that would be helpful.  She has my contact information to set that up at her discretion.   MOST form introduced   Hard Choices left for review  Natural trajectory and expectations at EOL were discussed.  Questions and concerns addressed.   Family encouraged to call with questions or concerns.    PMT will continue to support holistically.  SUMMARY OF RECOMMENDATIONS    Code Status/Advance Care Planning:  Full code  Discussed with patient the importance of continued conversation with her  family and the medical providers regarding overall plan of care and treatment options,  ensuring decisions are within the context of the patients values and GOCs.  Stressed the importance of discussion and documentation of her advanced care planning wishes    Palliative Prophylaxis:   Frequent Pain Assessment  Additional Recommendations (Limitations, Scope, Preferences):  Full Scope Treatment  Psycho-social/Spiritual:   Desire for further Chaplaincy support:no  Additional Recommendations: Education on Hospice  Prognosis:   Unable to determine  Discharge Planning: Home with Home Health      Primary Diagnoses:  Present on Admission: . Mass of upper lobe of right lung   I have reviewed the medical record, interviewed the patient and family, and examined the patient. The following aspects are pertinent.  Past Medical History:  Diagnosis Date  . Anxiety   . Arthritis    osteoarthritis.  . Depression   . Hypertension    Social History   Socioeconomic History  . Marital status: Single    Spouse name: Not on file  . Number of children: Not on file  . Years of education: Not on file  . Highest education level: Not on file  Occupational History  . Not on file  Social Needs  . Financial resource strain: Not on file  . Food insecurity    Worry: Not on file    Inability: Not on file  . Transportation needs    Medical: Not on file    Non-medical: Not on file  Tobacco Use  . Smoking status: Former Smoker    Packs/day: 0.25    Types: Cigarettes    Quit date: 04/14/2014    Years since quitting: 4.9  . Smokeless tobacco: Never Used  Substance and Sexual Activity  . Alcohol use: Yes    Alcohol/week: 21.0 standard drinks    Types: 21 Glasses of wine per week    Comment: socially wine  . Drug use: Yes    Types: Marijuana  . Sexual activity: Never  Lifestyle  . Physical activity    Days per week: Not on file    Minutes per session: Not on file  . Stress: Not on file  Relationships  . Social Herbalist on phone: Not on file    Gets together: Not on file    Attends religious service: Not on file    Active member of club or organization: Not on file    Attends meetings of clubs or organizations: Not on file    Relationship status: Not on file  Other Topics Concern  . Not on file  Social History Narrative  . Not on file   Family History  Problem Relation Age of Onset  . Hypertension Mother   . Rheum arthritis Mother   . Other Father        suicide  . Diabetes Paternal Grandmother   . Diabetes Paternal Grandfather    Scheduled Meds: . buPROPion  300 mg Oral Daily   . Chlorhexidine Gluconate Cloth  6 each Topical Daily  . citalopram  20 mg Oral Daily  . enoxaparin (LOVENOX) injection  40 mg Subcutaneous Q24H  . feeding supplement (ENSURE ENLIVE)  237 mL Oral TID BM  . folic acid  1 mg Oral Daily  . multivitamin with minerals  1 tablet Oral Daily  . polyethylene glycol  17 g Oral Daily  . prednisoLONE acetate  1 drop Left Eye QID  . ramelteon  8 mg Oral QHS  . senna-docusate  2 tablet Oral BID  . sodium chloride flush  3 mL Intravenous Q12H  . thiamine  100 mg Oral Daily   Continuous Infusions: PRN Meds:.acetaminophen **OR** acetaminophen, albuterol, guaiFENesin-dextromethorphan, ketorolac, ondansetron **OR** ondansetron (ZOFRAN) IV Medications Prior to Admission:  Prior to Admission medications   Medication Sig Start Date End Date Taking? Authorizing Provider  acetaminophen (TYLENOL) 500 MG tablet Take 1 tablet (500 mg total) by mouth every 6 (six) hours as needed for moderate pain or headache. 08/18/17  Yes Hairston, Maylon Peppers, FNP  albuterol (VENTOLIN HFA) 108 (90 Base) MCG/ACT inhaler Inhale 1-2 puffs into the lungs every 6 (six) hours as needed for wheezing or shortness of breath.   Yes [provider]  buPROPion (WELLBUTRIN XL) 300 MG 24 hr tablet TAKE 1 TABLET (300 MG TOTAL) BY MOUTH DAILY. 03/06/19  Yes Kristen Pier, MD  cetirizine (ZYRTEC) 10 MG tablet Take 1 tablet (10 mg total) by mouth daily. 01/07/19  Yes Charlott Rakes, MD  citalopram (CELEXA) 40 MG tablet Take 1 tablet (40 mg total) by mouth daily. Patient taking differently: Take 20 mg by mouth daily.  02/21/19  Yes McClung, Angela M, PA-C  fluticasone (FLONASE) 50 MCG/ACT nasal spray Place 2 sprays into both nostrils daily. 01/07/19  Yes Newlin, Charlane Ferretti, MD  hydrochlorothiazide (HYDRODIURIL) 25 MG tablet TAKE 1 TABLET BY MOUTH DAILY. Patient taking differently: Take 25 mg by mouth daily.  02/22/19  Yes Kristen Pier, MD  Lavender Oil OIL Apply 1 application topically 2  (two) times daily as needed (knee pain).   Yes [provider]  lisinopril (ZESTRIL) 40 MG tablet TAKE 1 TABLET BY MOUTH DAILY. Patient taking differently: Take 40 mg by mouth daily.  02/22/19  Yes Kristen Pier, MD  methotrexate (RHEUMATREX) 2.5 MG tablet Take 25 mg by mouth every Friday. Caution:Chemotherapy. Protect from light.    Yes [provider]  prednisoLONE acetate (PRED FORTE) 1 % ophthalmic suspension Place 1 drop into the left eye 4 (four) times daily.   Yes [provider]  traZODone (DESYREL) 50 MG tablet Take 0.5-1 tablets (25-50 mg total) by mouth at bedtime as needed for sleep. 02/21/19  Yes Freeman Caldron M, PA-C   No Known Allergies Review of Systems  Constitutional: Positive for fatigue.  Respiratory: Positive for cough and shortness of breath.     Physical Exam Constitutional:      Appearance: She is underweight. She is ill-appearing.  Cardiovascular:     Rate and Rhythm: Tachycardia present.  Musculoskeletal:     Comments: Generalized weakness and muscle atrophy  Skin:    General: Skin is warm and dry.  Neurological:     Mental Status: She is alert.     Vital Signs: BP 100/75 (BP Location: Right Arm)   Pulse (!) 105   Temp 99.2 F (37.3 C) (Oral)   Resp 20   Ht 5' 2.99" (1.6 m)   Wt 61.7 kg   SpO2 94%   BMI 24.12 kg/m  Pain Scale: 0-10   Pain Score: Asleep   SpO2: SpO2: 94 % O2 Device:SpO2: 94 % O2 Flow Rate: .O2 Flow Rate (L/min): 2 L/min  IO: Intake/output summary:   Intake/Output Summary (Last 24 hours) at 04/03/2019 1008 Last data filed at 04/03/2019 0823 Gross per 24 hour  Intake 720 ml  Output 300  ml  Net 420 ml    LBM: Last BM Date: 03/28/19 Baseline Weight: Weight: 59 kg Most recent weight: Weight: 61.7 kg     Palliative Assessment/Data:   Discussed with Dr Dareen Piano  Time In: 0830 Time Out: 0940 Time Total: 70 minutes Greater than 50%  of this time was spent counseling and coordinating care  related to the above assessment and plan.  Signed by: Kristen Lessen, NP   Please contact Palliative Medicine Team phone at [phone] for questions and concerns.  For individual provider: See Shea Evans

## 2019-04-03 NOTE — Progress Notes (Signed)
Patient is receiving blood and is tolerating well. There no apparent signs and symptoms of a reaction. Will continue to monitor.

## 2019-04-03 NOTE — Progress Notes (Signed)
Subjective: Spoke with patient this morning at bedside. Patient was awake, alert, and reported no pain save for soreness at the catheter site. Patient was eager to go home and was using meditation to calm her breathing. Discussed hemoglobin results with patient and the possibility of getting a blood transfusion prior to her discharge. Patient was raised as a Jehovah's Witness and found the procedure repulsive. Patient wanted to have more time to think and would like to speak to her family before making a final decision. Patient was also notified that she would be able to have a transfusion done as an outpatient if she changes her mind. Patient asked if she could have her arm covered if she decided to have a transfusion. Palliative medicine reported that patient had been grappling with whether or not to accept treatment for her cancer and was still undecided.   Objective:  Vital signs in last 24 hours: Vitals:   04/02/19 2021 04/03/19 0515 04/03/19 0528 04/03/19 0737  BP: 102/70 100/75    Pulse: (!) 106 96  (!) 105  Resp: 20 20    Temp: 98.7 F (37.1 C) 99.2 F (37.3 C)    TempSrc: Oral Oral    SpO2: 99% 100%  94%  Weight:   61.7 kg   Height:       Weight change: -1.365 kg  Intake/Output Summary (Last 24 hours) at 04/03/2019 1036 Last data filed at 04/03/2019 1610 Gross per 24 hour  Intake 720 ml  Output 300 ml  Net 420 ml   Physical Exam  Constitutional: She appears well-developed.  HENT:  Head: Normocephalic and atraumatic.  Mouth/Throat: Oropharynx is clear and moist.  Eyes: Conjunctivae and EOM are normal. Right eye exhibits no discharge. Left eye exhibits no discharge.  Neck: Normal range of motion. Neck supple.  Cardiovascular: Regular rhythm, normal heart sounds and intact distal pulses. Tachycardia present.  Pulmonary/Chest: No respiratory distress. She has decreased breath sounds in the right upper field, the right middle field and the right lower field.  Patient  appeared to be breathing much more comfortably today on room air. Was able to speak in complete sentences. Effort of breathing was slightly increased, however improved from yesterday.   Musculoskeletal: Normal range of motion.        General: No edema.  Neurological: She is alert.  Patient answered questions appropriately.   Skin: Skin is warm and dry.  Psychiatric: She has a normal mood and affect. Her behavior is normal. Thought content normal.  Patient was pleasant throughout conversation and tearful at end of visit.   Assessment/Plan: Kristen Chavez is a 68 y.o. female on hospital day8with history of HTN, OA, and depression and was found to have right upper lobe malignancy,possible bladder cancer, right pleural effusion that is being managed with a right PleurX drainage catheter, and anemia of chronic disease.   Active Problems:   Mass of upper lobe of right lung   Adenocarcinoma (HCC)   Pleural effusion, malignant  Adenocarcinoma: No further evaluation or treatment required at this time.  *Follow-up with Dr. Irene Limbo as outpatient on 10/7 at 11 am.  *Possible PDL1 and foundation 1 testing is being determined via pathology *Refer to urology at [place] as outpatient to assess possible malignancy in bladder.  Pleural effusion:  Patient has been weaning herself off of oxygen and ease of breathing seemed better today. Home Health RN will assist with education on how to use the PleurX. Patient has the pluerx  box of 10 in her room. No further work-up at this time. *Expected discharge to home with [place].  Anemia: Hemoglobin continues to slightly decrease from 7.3 to 7.0 from yesterday, which is consistent with anemia caused by vascular leakage secondary to malignancy. Iron panel showed a decreased Iron of 13, decreased TIBC of 195, and decreased saturation ratio of 7%. Ferritin is within normal range at 185. A low iron and a low TIBC indicate anemia of chronic  disease. Patient consented to receive blood transfusion prior to discharge.  *Initiate ferrous sulfate 325 mg tablet once every other day.  *Follow-up with Dr. Irene Limbo at appointment on 10/7.  HTN:  Patient presented on home medications lisinopril and hydrochlorothiazide and continued on lisinopril as inpatient. Inpatient blood pressures continue to be low with bp of 105/75 today. *Continue lisinopril *Dicharge with both lisinopril and hydrochlorothiazide *Follow-up with PCP as outpatient   LOS: 7 days   Kristen Chavez, Medical Student 04/03/2019, 10:36 AM

## 2019-04-03 NOTE — Anesthesia Postprocedure Evaluation (Signed)
Anesthesia Post Note  Patient: [name]  Procedure(s) Performed: INSERTION PLEURAL DRAINAGE CATHETER (Right Thoracic)     Patient location during evaluation: PACU Anesthesia Type: MAC Level of consciousness: awake and alert Pain management: pain level controlled Vital Signs Assessment: post-procedure vital signs reviewed and stable Respiratory status: spontaneous breathing, nonlabored ventilation, respiratory function stable and patient connected to nasal cannula oxygen Cardiovascular status: stable and blood pressure returned to baseline Postop Assessment: no apparent nausea or vomiting Anesthetic complications: no    Last Vitals:  Vitals:   04/03/19 1634 04/03/19 1634  BP: 98/74 98/74  Pulse: (!) 108 (!) 108  Resp: 20   Temp: 37.5 C 37.5 C  SpO2:  100%    Last Pain:  Vitals:   04/03/19 1634  TempSrc: Oral  PainSc:                  Lataja Newland

## 2019-04-04 DIAGNOSIS — R0602 Shortness of breath: Secondary | ICD-10-CM

## 2019-04-04 DIAGNOSIS — Z515 Encounter for palliative care: Secondary | ICD-10-CM

## 2019-04-04 DIAGNOSIS — Z7189 Other specified counseling: Secondary | ICD-10-CM

## 2019-04-04 LAB — CBC
HCT: 26.8 % — ABNORMAL LOW (ref 36.0–46.0)
Hemoglobin: 8.8 g/dL — ABNORMAL LOW (ref 12.0–15.0)
MCH: 26 pg (ref 26.0–34.0)
MCHC: 32.8 g/dL (ref 30.0–36.0)
MCV: 79.1 fL — ABNORMAL LOW (ref 80.0–100.0)
Platelets: 532 10*3/uL — ABNORMAL HIGH (ref 150–400)
RBC: 3.39 MIL/uL — ABNORMAL LOW (ref 3.87–5.11)
RDW: 15.3 % (ref 11.5–15.5)
WBC: 11.8 10*3/uL — ABNORMAL HIGH (ref 4.0–10.5)
nRBC: 0 % (ref 0.0–0.2)

## 2019-04-04 LAB — TYPE AND SCREEN
ABO/RH(D): O POS
Antibody Screen: NEGATIVE
Unit division: 0

## 2019-04-04 LAB — BPAM RBC
Blood Product Expiration Date: 202010262359
ISSUE DATE / TIME: 202009231608
Unit Type and Rh: 5100

## 2019-04-04 MED ORDER — HYDROXYZINE HCL 25 MG PO TABS: 25.0000 mg | ORAL_TABLET | Freq: Three times a day (TID) | ORAL | 0 refills | Status: AC | PRN

## 2019-04-04 MED FILL — hydrOXYzine HCL 25 MG TABS: 25 | 30 days supply | Qty: 90 | Fill #0

## 2019-04-04 MED FILL — VENTOLIN HFA 90 MCG INHALER: 108 (90 BAS | 25 days supply | Qty: 18 | Fill #0

## 2019-04-04 NOTE — Progress Notes (Signed)
RN gave pt discharge instructions and she stated understanding. Port looks clean dry and intact. Pt not having any pain

## 2019-04-04 NOTE — Progress Notes (Signed)
Late entry: Patient is a Kristen Chavez witness has agreed to 1 unit and consent signed and extra copy was given to the blood bank upon blood pick up time.

## 2019-04-04 NOTE — Progress Notes (Signed)
Subjective: Spoke to patient this morning. Patient was dressed and sitting up in bed. Patient denied pain save for some soreness at the catheter site. Patient reports that she has been breathing easier since yesterday. Patient had concerns about the amount of blood in the pleural fluid taken yesterday out of her PleurX catheter. Discussed concerns with patient and notified her that cardiothoracic surgery will contact her today and let the team know if they feel it is safe for her to be discharged today.  Objective:  Vital signs in last 24 hours: Vitals:   04/04/19 0411 04/04/19 0422 04/04/19 0852 04/04/19 1002  BP: 103/80   95/76  Pulse: (!) 105  (!) 125 94  Resp:    20  Temp:    98.6 F (37 C)  TempSrc:    Oral  SpO2:   95% 100%  Weight:  60.8 kg    Height:       Weight change: -0.907 kg  Intake/Output Summary (Last 24 hours) at 04/04/2019 1110 Last data filed at 04/04/2019 0936 Gross per 24 hour  Intake 1518 ml  Output 1550 ml  Net -32 ml   Physical Exam  Constitutional: She appears well-developed and well-nourished. No distress.  HENT:  Head: Normocephalic and atraumatic.  Eyes: EOM are normal.  Neck: Normal range of motion.  Cardiovascular: Normal heart sounds and intact distal pulses. Tachycardia present.  Pulmonary/Chest: She has decreased breath sounds in the right upper field, the right middle field and the right lower field.  Improved effort of breathing compared to yesterday.   Musculoskeletal:        General: No edema.  Neurological: She is alert.  Patient answered questions appropriately.   Skin: Skin is warm and dry.  Psychiatric: She has a normal mood and affect. Her behavior is normal. Thought content normal.  Patient was pleasant, dressed, and eager to return home.     Assessment/Plan: Kristen Chavez is a 68 y.o. female on hospital day9with history of HTN, OA, and depression and was found to have right upper lobe malignancy,possible bladder cancer,  right pleural effusionthat is being managed with a rightPleurXdrainage catheter, and anemia of chronic disease.  Active Problems:   Mass of upper lobe of right lung   Adenocarcinoma (HCC)   Pleural effusion, malignant  Adenocarcinoma: No further evaluation or treatment required at this time.  *Follow-up with Dr. Arlyss Queen outpatienton 10/7 at 11 am. *Possible PDL1 and foundation 1 testing is being determined via pathology *Refer to urology at [place] as outpatient to assess possible malignancy in bladder.  Pleural effusion:  Patient has been breathing easier compared to yesterday. Patient was worried about the darkness of the pleural fluid drained out of her catheter yesterday. Cardiothoracic Surgery stated that though frank bloody drainage is not normal, patient may be discharged per medicine. No further work-up at this time. As her hemoglobin is stable (at 8.8 this morning, up from 8 last night) patient may be discharged with follow-up with Dr. Irene Limbo.  *Expected discharge to home with [place]. *Follow up with Dr. Irene Limbo  Anemia: Patient received 1 unit of blood yesterday which raised her hemoglobin from 7 to 8. Patient's hemoglobin further elevated to 8.8 this morning. No further evaluation is indicated at this time. *Initiate ferrous sulfate325 mgtablet once every other day.  *Follow-up with Dr. Irene Limbo at appointment on 10/7.  HTN: As patient is maintaining borderline blood pressures while off of her antihypertensives, she may be discharged without  resuming HTN medications.     LOS: 8 days   [name], Medical Student 04/04/2019, 11:10 AM

## 2019-04-04 NOTE — TOC Progression Note (Signed)
Transition of Care [facility]) - Progression Note    Patient Details  Name: Kristen Chavez MRN: [id number] Date of Birth: March 09, 1951  Transition of Care [facility]) CM/SW [place], [place] Phone Number: [phone] 04/04/2019, 9:29 AM  Clinical Narrative:     Patient to be set up with [employer] for home oxygen needs, O2 and DME equipment of 3in1 and rollator to be delivered to room. Patient set up with [facility] home health per [facility]. No further needs identified at this time. Family to pick up at discharge.   Zack with [employer] informed patient to be moved to 5C07 for discharge holding, equipment to be delivered to [place] room prior to discharge.   Expected Discharge Plan: [place] Barriers to Discharge: No Barriers Identified  Expected Discharge Plan and Services Expected Discharge Plan: [place] arrangements for the past 2 months: Single Family Home Expected Discharge Date: 04/03/19               DME Arranged: Gilford Rile rolling DME Agency: [employer] Date DME Agency Contacted: 03/29/19 Time DME Agency Contacted: [phone] Representative spoke with at DME Agency: [place]: OT, PT [place] Agency: [place] Date [place]: 03/29/19 Time [place]: 1432 Representative spoke with at [place]: [place] ([place]) Interventions    Readmission Risk Interventions No flowsheet data found.

## 2019-04-04 NOTE — Progress Notes (Signed)
SATURATION QUALIFICATIONS: (This note is used to comply with regulatory documentation for home oxygen)  Patient Saturations on Room Air at Rest = 85 %  Patient Saturations on Room Air while Ambulating = 90%  Patient Saturations on 3 Liters of oxygen while Ambulating = 95 %  Please briefly explain why patient needs home oxygen: desat and sob while walking

## 2019-04-04 NOTE — Progress Notes (Signed)
Physical Therapy Treatment Patient Details Name: Kristen Chavez MRN: [id number] DOB: 10/06/1950 Today's Date: 04/04/2019    History of Present Illness 68 yo female admitted with chest pain and fatigue found to have Rt upper lobe mass s/p thoracentesis with concern for malignancy, pleurex catheter placed 9/21. PMHx: HTN, anxiety, depression    PT Comments    Pt supine on arrival and end of session declining OOB to chair. PT able to maintain SpO2 92-95% on RA throughout session with seated rest between short distance gait trials. PT with decreased activity tolerance with education for energy conservation and slow progression as able as well as listening to her body and fatigue level. Pt encouraged to have home pulse oximeter and she would benefit from rollator over RW for fatigue. Will continue to follow.    Follow Up Recommendations  Home health PT;Supervision for mobility/OOB     Equipment Recommendations  Rolling walker with 5" wheels(rollator)    Recommendations for Other Services       Precautions / Restrictions Precautions Precautions: Fall Precaution Comments: watch sats    Mobility  Bed Mobility Overal bed mobility: Independent                Transfers Overall transfer level: Modified independent               General transfer comment: sit to stand x 3 trials at bed and chair  Ambulation/Gait Ambulation/Gait assistance: Supervision Gait Distance (Feet): 60 Feet Assistive device: Rolling walker (2 wheeled) Gait Pattern/deviations: Step-through pattern;Decreased stride length   Gait velocity interpretation: 1.31 - 2.62 ft/sec, indicative of limited community ambulator General Gait Details: pt walked 60', 30', 60' with seated rest between each trial with SpO2 92-95% on RA throughout with HR 110-125   Stairs Stairs: Yes Stairs assistance: Min assist Stair Management: Step to pattern;Forwards Number of Stairs: 3 General stair comments: 3 steps  ascending with HHA and descending with hands on therapist shoulders, cues for sequence and assist   Wheelchair Mobility    Modified Rankin (Stroke Patients Only)       Balance Overall balance assessment: Mild deficits observed, not formally tested                                          Cognition Arousal/Alertness: Awake/alert Behavior During Therapy: WFL for tasks assessed/performed Overall Cognitive Status: Within Functional Limits for tasks assessed                                        Exercises      General Comments        Pertinent Vitals/Pain Pain Score: 4  Pain Location: right chest Pain Descriptors / Indicators: Sore;Aching;Guarding Pain Intervention(s): Limited activity within patient's tolerance;Repositioned    Home Living                      Prior Function            PT Goals (current goals can now be found in the care plan section) Progress towards PT goals: Progressing toward goals    Frequency    Min 3X/week      PT Plan Current plan remains appropriate    Co-evaluation  AM-PAC PT "6 Clicks" Mobility   Outcome Measure  Help needed turning from your back to your side while in a flat bed without using bedrails?: None Help needed moving from lying on your back to sitting on the side of a flat bed without using bedrails?: None Help needed moving to and from a bed to a chair (including a wheelchair)?: None Help needed standing up from a chair using your arms (e.g., wheelchair or bedside chair)?: None Help needed to walk in hospital room?: A Little Help needed climbing 3-5 steps with a railing? : A Little 6 Click Score: 22    End of Session   Activity Tolerance: Patient limited by fatigue Patient left: in bed;with call bell/phone within reach Nurse Communication: Mobility status PT Visit Diagnosis: Other abnormalities of gait and mobility (R26.89);Muscle weakness  (generalized) (M62.81);Unsteadiness on feet (R26.81)     Time: 5947-0761 PT Time Calculation (min) (ACUTE ONLY): 15 min  Charges:  $Gait Training: 8-22 mins                     [place], PT Acute Rehabilitation Services Pager: [phone] Office: [phone]    Sandy Salaam Wilberta Dorvil 04/04/2019, 8:54 AM

## 2019-04-04 NOTE — Progress Notes (Signed)
I spoke with PA who has previously seen the patient and the sero sanguineous drainage has been present post op. Frank bloody drainage is not normal, however. Patient may be discharged per medicine.

## 2019-04-04 NOTE — Plan of Care (Signed)
  Problem: Education: Goal: Knowledge of General Education information will improve Description: Including pain rating scale, medication(s)/side effects and non-pharmacologic comfort measures 04/04/2019 0816 by Rolm Baptise, RN Outcome: Adequate for Discharge 04/03/2019 1843 by Rolm Baptise, RN Outcome: Progressing   Problem: Health Behavior/Discharge Planning: Goal: Ability to manage health-related needs will improve 04/04/2019 0816 by Rolm Baptise, RN Outcome: Adequate for Discharge 04/03/2019 1843 by Rolm Baptise, RN Outcome: Progressing   Problem: Clinical Measurements: Goal: Ability to maintain clinical measurements within normal limits will improve 04/04/2019 0816 by Rolm Baptise, RN Outcome: Adequate for Discharge 04/03/2019 1843 by Rolm Baptise, RN Outcome: Progressing Goal: Will remain free from infection 04/04/2019 0816 by Rolm Baptise, RN Outcome: Adequate for Discharge 04/03/2019 1843 by Rolm Baptise, RN Outcome: Progressing Goal: Diagnostic test results will improve 04/04/2019 0816 by Rolm Baptise, RN Outcome: Adequate for Discharge 04/03/2019 1843 by Rolm Baptise, RN Outcome: Progressing Goal: Respiratory complications will improve 04/04/2019 0816 by Rolm Baptise, RN Outcome: Adequate for Discharge 04/03/2019 1843 by Rolm Baptise, RN Outcome: Progressing Goal: Cardiovascular complication will be avoided 04/04/2019 0816 by Rolm Baptise, RN Outcome: Adequate for Discharge 04/03/2019 1843 by Rolm Baptise, RN Outcome: Progressing   Problem: Activity: Goal: Risk for activity intolerance will decrease 04/04/2019 0816 by Rolm Baptise, RN Outcome: Adequate for Discharge 04/03/2019 1843 by Rolm Baptise, RN Outcome: Progressing   Problem: Nutrition: Goal: Adequate nutrition will be maintained 04/04/2019 0816 by Rolm Baptise, RN Outcome: Adequate for Discharge 04/03/2019 1843 by Rolm Baptise, RN Outcome: Progressing    Problem: Coping: Goal: Level of anxiety will decrease 04/04/2019 0816 by Rolm Baptise, RN Outcome: Adequate for Discharge 04/03/2019 1843 by Rolm Baptise, RN Outcome: Progressing   Problem: Elimination: Goal: Will not experience complications related to bowel motility 04/04/2019 0816 by Rolm Baptise, RN Outcome: Adequate for Discharge 04/03/2019 1843 by Rolm Baptise, RN Outcome: Progressing Goal: Will not experience complications related to urinary retention 04/04/2019 0816 by Rolm Baptise, RN Outcome: Adequate for Discharge 04/03/2019 1843 by Rolm Baptise, RN Outcome: Progressing   Problem: Pain Managment: Goal: General experience of comfort will improve 04/04/2019 0816 by Rolm Baptise, RN Outcome: Adequate for Discharge 04/03/2019 1843 by Rolm Baptise, RN Outcome: Progressing   Problem: Safety: Goal: Ability to remain free from injury will improve 04/04/2019 0816 by Rolm Baptise, RN Outcome: Adequate for Discharge 04/03/2019 1843 by Rolm Baptise, RN Outcome: Progressing   Problem: Skin Integrity: Goal: Risk for impaired skin integrity will decrease 04/04/2019 0816 by Rolm Baptise, RN Outcome: Adequate for Discharge 04/03/2019 1843 by Rolm Baptise, RN Outcome: Progressing

## 2019-04-04 NOTE — TOC Progression Note (Signed)
Transition of Care [facility]) - Progression Note    Patient Details  Name: Kristen Chavez MRN: [id number] Date of Birth: 16-Apr-1951  Transition of Care [facility] CM/SW Contact  Zenon Mayo, RN Phone Number: 04/04/2019, 1:09 PM  Clinical Narrative:    Patient is for dc today, per Zack with [employer] states they ran insurance for home oxygen , bsc and rollator and her Medicare B has termed, she does not have coverage, the TOC dep will do LOG for home oxygen for 30 days for 146.56 and will pay for the bsc 39.00 and the rollator 127.00 for patient.  NCM informed patient that the home oxygen is only for 30 days and that the Medicare enrollment starts on Oct 7 and she needs to make sure she reinstates her insurance for Medicare B, A and D.  She states she understands.  Informed her she will need to do that to continue to use her home oxygen because the hospital is only covering for 30 days, she states she understands.  This was approved thru [place] in [facility] Department.    Expected Discharge Plan: [place] Barriers to Discharge: No Barriers Identified  Expected Discharge Plan and Services Expected Discharge Plan: [place] arrangements for the past 2 months: Single Family Home Expected Discharge Date: 04/03/19               DME Arranged: Gilford Rile rolling DME Agency: [employer] Date DME Agency Contacted: 03/29/19 Time DME Agency Contacted: [phone] Representative spoke with at DME Agency: [place]: OT, PT [place] Agency: [place] Date [place]: 03/29/19 Time [place]: 1432 Representative spoke with at [place]: [place] ([place]) Interventions    Readmission Risk Interventions No flowsheet data found.

## 2019-04-05 DIAGNOSIS — R634 Abnormal weight loss: Secondary | ICD-10-CM | POA: Diagnosis not present

## 2019-04-05 DIAGNOSIS — Z48813 Encounter for surgical aftercare following surgery on the respiratory system: Secondary | ICD-10-CM | POA: Diagnosis not present

## 2019-04-05 DIAGNOSIS — J910 Malignant pleural effusion: Secondary | ICD-10-CM | POA: Diagnosis not present

## 2019-04-05 DIAGNOSIS — Z6823 Body mass index (BMI) 23.0-23.9, adult: Secondary | ICD-10-CM | POA: Diagnosis not present

## 2019-04-05 DIAGNOSIS — Z87891 Personal history of nicotine dependence: Secondary | ICD-10-CM | POA: Diagnosis not present

## 2019-04-05 DIAGNOSIS — C3411 Malignant neoplasm of upper lobe, right bronchus or lung: Secondary | ICD-10-CM | POA: Diagnosis not present

## 2019-04-05 DIAGNOSIS — J449 Chronic obstructive pulmonary disease, unspecified: Secondary | ICD-10-CM | POA: Diagnosis not present

## 2019-04-05 DIAGNOSIS — D0221 Carcinoma in situ of right bronchus and lung: Secondary | ICD-10-CM | POA: Diagnosis not present

## 2019-04-05 DIAGNOSIS — D649 Anemia, unspecified: Secondary | ICD-10-CM | POA: Diagnosis not present

## 2019-04-05 DIAGNOSIS — M170 Bilateral primary osteoarthritis of knee: Secondary | ICD-10-CM | POA: Diagnosis not present

## 2019-04-05 DIAGNOSIS — I10 Essential (primary) hypertension: Secondary | ICD-10-CM | POA: Diagnosis not present

## 2019-04-05 DIAGNOSIS — Z79899 Other long term (current) drug therapy: Secondary | ICD-10-CM | POA: Diagnosis not present

## 2019-04-05 DIAGNOSIS — F329 Major depressive disorder, single episode, unspecified: Secondary | ICD-10-CM | POA: Diagnosis not present

## 2019-04-05 DIAGNOSIS — Z9981 Dependence on supplemental oxygen: Secondary | ICD-10-CM | POA: Diagnosis not present

## 2019-04-05 DIAGNOSIS — Z9181 History of falling: Secondary | ICD-10-CM | POA: Diagnosis not present

## 2019-04-05 DIAGNOSIS — F411 Generalized anxiety disorder: Secondary | ICD-10-CM | POA: Diagnosis not present

## 2019-04-05 DIAGNOSIS — N3289 Other specified disorders of bladder: Secondary | ICD-10-CM | POA: Diagnosis not present

## 2019-04-05 MED FILL — METHOTREXATE SODIUM 2.5 MG: 2.5 | 28 days supply | Qty: 40 | Fill #2

## 2019-04-05 MED FILL — BUPROPION HCL XL 300 MG TAB: 300 | 30 days supply | Qty: 30 | Fill #1

## 2019-04-05 MED FILL — CITALOPRAM HBR 40 MG TABLET: 40 | 30 days supply | Qty: 30 | Fill #1

## 2019-04-06 DIAGNOSIS — Z48813 Encounter for surgical aftercare following surgery on the respiratory system: Secondary | ICD-10-CM | POA: Diagnosis not present

## 2019-04-06 DIAGNOSIS — C3411 Malignant neoplasm of upper lobe, right bronchus or lung: Secondary | ICD-10-CM | POA: Diagnosis not present

## 2019-04-06 DIAGNOSIS — I10 Essential (primary) hypertension: Secondary | ICD-10-CM | POA: Diagnosis not present

## 2019-04-06 DIAGNOSIS — D649 Anemia, unspecified: Secondary | ICD-10-CM | POA: Diagnosis not present

## 2019-04-06 DIAGNOSIS — N3289 Other specified disorders of bladder: Secondary | ICD-10-CM | POA: Diagnosis not present

## 2019-04-06 DIAGNOSIS — J910 Malignant pleural effusion: Secondary | ICD-10-CM | POA: Diagnosis not present

## 2019-04-08 ENCOUNTER — Telehealth: Payer: Self-pay | Admitting: Internal Medicine

## 2019-04-08 NOTE — Telephone Encounter (Signed)
[facility] is calling because they received a referral for home health care but they have made multiped attempts to vist the pt but the pt refuse to let them in, slammed the door in there face and she states she can not keep sending her clinicians out. They are requesting a call back from Dr. Wynetta Emery, the home health nurse says she will give it one more try after speaking with Wynetta Emery but after that a new facility will need to be provided. Please f/u

## 2019-04-09 DIAGNOSIS — D649 Anemia, unspecified: Secondary | ICD-10-CM | POA: Diagnosis not present

## 2019-04-09 DIAGNOSIS — C3411 Malignant neoplasm of upper lobe, right bronchus or lung: Secondary | ICD-10-CM | POA: Diagnosis not present

## 2019-04-09 DIAGNOSIS — I10 Essential (primary) hypertension: Secondary | ICD-10-CM | POA: Diagnosis not present

## 2019-04-09 DIAGNOSIS — J910 Malignant pleural effusion: Secondary | ICD-10-CM | POA: Diagnosis not present

## 2019-04-09 DIAGNOSIS — Z48813 Encounter for surgical aftercare following surgery on the respiratory system: Secondary | ICD-10-CM | POA: Diagnosis not present

## 2019-04-09 DIAGNOSIS — N3289 Other specified disorders of bladder: Secondary | ICD-10-CM | POA: Diagnosis not present

## 2019-04-09 NOTE — Telephone Encounter (Signed)
Call placed to [facility] # [phone], spoke to [facility], message left for Maudie Mercury to return call to this CM # [phone].

## 2019-04-10 NOTE — Telephone Encounter (Signed)
Call placed to [facility], spoke to [place], left message for [employer] requesting a call back to this CM

## 2019-04-11 DIAGNOSIS — N3289 Other specified disorders of bladder: Secondary | ICD-10-CM | POA: Diagnosis not present

## 2019-04-11 DIAGNOSIS — Z48813 Encounter for surgical aftercare following surgery on the respiratory system: Secondary | ICD-10-CM | POA: Diagnosis not present

## 2019-04-11 DIAGNOSIS — D649 Anemia, unspecified: Secondary | ICD-10-CM | POA: Diagnosis not present

## 2019-04-11 DIAGNOSIS — J910 Malignant pleural effusion: Secondary | ICD-10-CM | POA: Diagnosis not present

## 2019-04-11 DIAGNOSIS — I10 Essential (primary) hypertension: Secondary | ICD-10-CM | POA: Diagnosis not present

## 2019-04-11 DIAGNOSIS — C3411 Malignant neoplasm of upper lobe, right bronchus or lung: Secondary | ICD-10-CM | POA: Diagnosis not present

## 2019-04-12 DIAGNOSIS — Z48813 Encounter for surgical aftercare following surgery on the respiratory system: Secondary | ICD-10-CM | POA: Diagnosis not present

## 2019-04-12 DIAGNOSIS — N3289 Other specified disorders of bladder: Secondary | ICD-10-CM | POA: Diagnosis not present

## 2019-04-12 DIAGNOSIS — I10 Essential (primary) hypertension: Secondary | ICD-10-CM | POA: Diagnosis not present

## 2019-04-12 DIAGNOSIS — C3411 Malignant neoplasm of upper lobe, right bronchus or lung: Secondary | ICD-10-CM | POA: Diagnosis not present

## 2019-04-12 DIAGNOSIS — D649 Anemia, unspecified: Secondary | ICD-10-CM | POA: Diagnosis not present

## 2019-04-12 DIAGNOSIS — J910 Malignant pleural effusion: Secondary | ICD-10-CM | POA: Diagnosis not present

## 2019-04-15 ENCOUNTER — Emergency Department (HOSPITAL_COMMUNITY): Payer: Medicare Other

## 2019-04-15 ENCOUNTER — Encounter: Payer: Self-pay | Admitting: Internal Medicine

## 2019-04-15 ENCOUNTER — Ambulatory Visit: Payer: Self-pay | Attending: Internal Medicine | Admitting: Internal Medicine

## 2019-04-15 ENCOUNTER — Inpatient Hospital Stay (HOSPITAL_COMMUNITY)
Admission: EM | Admit: 2019-04-15 | Discharge: 2019-04-19 | DRG: 181 | Disposition: A | Discharge status: Home Health | Payer: Medicare Other | Attending: Internal Medicine | Admitting: Internal Medicine

## 2019-04-15 ENCOUNTER — Other Ambulatory Visit: Payer: Self-pay

## 2019-04-15 ENCOUNTER — Encounter (HOSPITAL_COMMUNITY): Payer: Self-pay | Admitting: *Deleted

## 2019-04-15 VITALS — BP 70/50 | HR 112 | Temp 97.9°F | Resp 18 | Ht 62.0 in | Wt 133.0 lb

## 2019-04-15 DIAGNOSIS — N3289 Other specified disorders of bladder: Secondary | ICD-10-CM

## 2019-04-15 DIAGNOSIS — Z8249 Family history of ischemic heart disease and other diseases of the circulatory system: Secondary | ICD-10-CM

## 2019-04-15 DIAGNOSIS — Z79891 Long term (current) use of opiate analgesic: Secondary | ICD-10-CM

## 2019-04-15 DIAGNOSIS — F329 Major depressive disorder, single episode, unspecified: Secondary | ICD-10-CM | POA: Insufficient documentation

## 2019-04-15 DIAGNOSIS — D638 Anemia in other chronic diseases classified elsewhere: Secondary | ICD-10-CM | POA: Insufficient documentation

## 2019-04-15 DIAGNOSIS — Z7952 Long term (current) use of systemic steroids: Secondary | ICD-10-CM | POA: Insufficient documentation

## 2019-04-15 DIAGNOSIS — R0600 Dyspnea, unspecified: Secondary | ICD-10-CM

## 2019-04-15 DIAGNOSIS — Z87891 Personal history of nicotine dependence: Secondary | ICD-10-CM

## 2019-04-15 DIAGNOSIS — D630 Anemia in neoplastic disease: Secondary | ICD-10-CM | POA: Diagnosis present

## 2019-04-15 DIAGNOSIS — I10 Essential (primary) hypertension: Secondary | ICD-10-CM | POA: Diagnosis present

## 2019-04-15 DIAGNOSIS — Z66 Do not resuscitate: Secondary | ICD-10-CM | POA: Diagnosis present

## 2019-04-15 DIAGNOSIS — I959 Hypotension, unspecified: Secondary | ICD-10-CM | POA: Diagnosis not present

## 2019-04-15 DIAGNOSIS — R0902 Hypoxemia: Secondary | ICD-10-CM | POA: Diagnosis present

## 2019-04-15 DIAGNOSIS — C3490 Malignant neoplasm of unspecified part of unspecified bronchus or lung: Secondary | ICD-10-CM

## 2019-04-15 DIAGNOSIS — F1221 Cannabis dependence, in remission: Secondary | ICD-10-CM | POA: Insufficient documentation

## 2019-04-15 DIAGNOSIS — Z79899 Other long term (current) drug therapy: Secondary | ICD-10-CM | POA: Insufficient documentation

## 2019-04-15 DIAGNOSIS — D62 Acute posthemorrhagic anemia: Secondary | ICD-10-CM | POA: Diagnosis present

## 2019-04-15 DIAGNOSIS — R05 Cough: Secondary | ICD-10-CM | POA: Diagnosis not present

## 2019-04-15 DIAGNOSIS — R079 Chest pain, unspecified: Secondary | ICD-10-CM | POA: Diagnosis not present

## 2019-04-15 DIAGNOSIS — D494 Neoplasm of unspecified behavior of bladder: Secondary | ICD-10-CM | POA: Diagnosis present

## 2019-04-15 DIAGNOSIS — M1990 Unspecified osteoarthritis, unspecified site: Secondary | ICD-10-CM

## 2019-04-15 DIAGNOSIS — D649 Anemia, unspecified: Secondary | ICD-10-CM | POA: Diagnosis not present

## 2019-04-15 DIAGNOSIS — D72829 Elevated white blood cell count, unspecified: Secondary | ICD-10-CM | POA: Diagnosis present

## 2019-04-15 DIAGNOSIS — C782 Secondary malignant neoplasm of pleura: Secondary | ICD-10-CM | POA: Diagnosis present

## 2019-04-15 DIAGNOSIS — C3491 Malignant neoplasm of unspecified part of right bronchus or lung: Secondary | ICD-10-CM

## 2019-04-15 DIAGNOSIS — Z20828 Contact with and (suspected) exposure to other viral communicable diseases: Secondary | ICD-10-CM | POA: Diagnosis present

## 2019-04-15 DIAGNOSIS — R7989 Other specified abnormal findings of blood chemistry: Secondary | ICD-10-CM

## 2019-04-15 DIAGNOSIS — J302 Other seasonal allergic rhinitis: Secondary | ICD-10-CM | POA: Diagnosis present

## 2019-04-15 DIAGNOSIS — Z978 Presence of other specified devices: Secondary | ICD-10-CM

## 2019-04-15 DIAGNOSIS — R630 Anorexia: Secondary | ICD-10-CM

## 2019-04-15 DIAGNOSIS — J910 Malignant pleural effusion: Secondary | ICD-10-CM | POA: Diagnosis present

## 2019-04-15 DIAGNOSIS — F419 Anxiety disorder, unspecified: Secondary | ICD-10-CM | POA: Diagnosis present

## 2019-04-15 DIAGNOSIS — Z8261 Family history of arthritis: Secondary | ICD-10-CM | POA: Diagnosis not present

## 2019-04-15 DIAGNOSIS — M170 Bilateral primary osteoarthritis of knee: Secondary | ICD-10-CM | POA: Insufficient documentation

## 2019-04-15 DIAGNOSIS — Z9889 Other specified postprocedural states: Secondary | ICD-10-CM

## 2019-04-15 DIAGNOSIS — C3411 Malignant neoplasm of upper lobe, right bronchus or lung: Secondary | ICD-10-CM | POA: Diagnosis not present

## 2019-04-15 DIAGNOSIS — Z9981 Dependence on supplemental oxygen: Secondary | ICD-10-CM | POA: Insufficient documentation

## 2019-04-15 DIAGNOSIS — N329 Bladder disorder, unspecified: Secondary | ICD-10-CM | POA: Insufficient documentation

## 2019-04-15 DIAGNOSIS — J90 Pleural effusion, not elsewhere classified: Secondary | ICD-10-CM | POA: Diagnosis not present

## 2019-04-15 DIAGNOSIS — C801 Malignant (primary) neoplasm, unspecified: Secondary | ICD-10-CM | POA: Diagnosis present

## 2019-04-15 DIAGNOSIS — Z818 Family history of other mental and behavioral disorders: Secondary | ICD-10-CM | POA: Insufficient documentation

## 2019-04-15 DIAGNOSIS — E861 Hypovolemia: Secondary | ICD-10-CM | POA: Insufficient documentation

## 2019-04-15 DIAGNOSIS — Z833 Family history of diabetes mellitus: Secondary | ICD-10-CM | POA: Insufficient documentation

## 2019-04-15 DIAGNOSIS — R0602 Shortness of breath: Secondary | ICD-10-CM | POA: Diagnosis not present

## 2019-04-15 DIAGNOSIS — I9589 Other hypotension: Secondary | ICD-10-CM | POA: Insufficient documentation

## 2019-04-15 DIAGNOSIS — Z09 Encounter for follow-up examination after completed treatment for conditions other than malignant neoplasm: Secondary | ICD-10-CM

## 2019-04-15 DIAGNOSIS — Z7189 Other specified counseling: Secondary | ICD-10-CM

## 2019-04-15 DIAGNOSIS — R059 Cough, unspecified: Secondary | ICD-10-CM

## 2019-04-15 DIAGNOSIS — K5900 Constipation, unspecified: Secondary | ICD-10-CM

## 2019-04-15 HISTORY — DX: Malignant (primary) neoplasm, unspecified: C80.1

## 2019-04-15 LAB — COMPREHENSIVE METABOLIC PANEL
ALT: 25 U/L (ref 0–44)
AST: 20 U/L (ref 15–41)
Albumin: 2.5 g/dL — ABNORMAL LOW (ref 3.5–5.0)
Alkaline Phosphatase: 89 U/L (ref 38–126)
Anion gap: 12 (ref 5–15)
BUN: 10 mg/dL (ref 8–23)
CO2: 22 mmol/L (ref 22–32)
Calcium: 8.7 mg/dL — ABNORMAL LOW (ref 8.9–10.3)
Chloride: 105 mmol/L (ref 98–111)
Creatinine, Ser: 0.96 mg/dL (ref 0.44–1.00)
GFR calc Af Amer: >60 mL/min (ref >60)
GFR calc non Af Amer: 60 mL/min — ABNORMAL LOW (ref >60)
Glucose, Bld: 120 mg/dL — ABNORMAL HIGH (ref 70–99)
Potassium: 4.3 mmol/L (ref 3.5–5.1)
Sodium: 139 mmol/L (ref 135–145)
Total Bilirubin: 0.2 mg/dL — ABNORMAL LOW (ref 0.3–1.2)
Total Protein: 6.3 g/dL — ABNORMAL LOW (ref 6.5–8.1)

## 2019-04-15 LAB — CBC
HCT: 22.2 % — ABNORMAL LOW (ref 36.0–46.0)
Hemoglobin: 7 g/dL — ABNORMAL LOW (ref 12.0–15.0)
MCH: 25.2 pg — ABNORMAL LOW (ref 26.0–34.0)
MCHC: 31.5 g/dL (ref 30.0–36.0)
MCV: 79.9 fL — ABNORMAL LOW (ref 80.0–100.0)
Platelets: 594 10*3/uL — ABNORMAL HIGH (ref 150–400)
RBC: 2.78 MIL/uL — ABNORMAL LOW (ref 3.87–5.11)
RDW: 15.9 % — ABNORMAL HIGH (ref 11.5–15.5)
WBC: 12.4 10*3/uL — ABNORMAL HIGH (ref 4.0–10.5)
nRBC: 0 % (ref 0.0–0.2)

## 2019-04-15 LAB — LACTIC ACID, PLASMA
Lactic Acid, Venous: 2.3 mmol/L (ref 0.5–1.9)
Lactic Acid, Venous: 1.4 mmol/L (ref 0.5–1.9)

## 2019-04-15 LAB — TROPONIN I (HIGH SENSITIVITY)
Troponin I (High Sensitivity): 8 ng/L (ref <18)
Troponin I (High Sensitivity): 6 ng/L (ref <18)

## 2019-04-15 LAB — SARS CORONAVIRUS 2 (TAT 6-24 HRS): SARS Coronavirus 2: NEGATIVE

## 2019-04-15 MED ORDER — BUPROPION HCL ER (XL) 150 MG PO TB24
300.0000 mg | ORAL_TABLET | Freq: Every day | ORAL | Status: DC
  Administered 2019-04-15 – 2019-04-19 (×5): 300 mg via ORAL
  Filled 2019-04-15 (×5): qty 2

## 2019-04-15 MED ORDER — SODIUM CHLORIDE 0.9 % IV SOLN
Freq: Once | INTRAVENOUS | Status: AC
  Administered 2019-04-16: 01:00:00 via INTRAVENOUS

## 2019-04-15 MED ORDER — SODIUM CHLORIDE 0.9 % IV BOLUS
1000.0000 mL | Freq: Once | INTRAVENOUS | Status: AC
  Administered 2019-04-15: 16:00:00 1000 mL via INTRAVENOUS

## 2019-04-15 MED ORDER — ALBUTEROL SULFATE (2.5 MG/3ML) 0.083% IN NEBU
2.5000 mg | INHALATION_SOLUTION | Freq: Four times a day (QID) | RESPIRATORY_TRACT | Status: DC | PRN
  Administered 2019-04-16 – 2019-04-17 (×2): 2.5 mg via RESPIRATORY_TRACT
  Filled 2019-04-15 (×2): qty 3

## 2019-04-15 MED ORDER — SODIUM CHLORIDE 0.9% FLUSH: 3.0000 mL | Freq: Once | INTRAVENOUS | Status: DC

## 2019-04-15 MED ORDER — SODIUM CHLORIDE 0.9% IV SOLUTION
Freq: Once | INTRAVENOUS | Status: AC
  Administered 2019-04-15: 22:00:00 via INTRAVENOUS

## 2019-04-15 MED ORDER — TRAZODONE HCL 50 MG PO TABS: 25.0000 mg | ORAL_TABLET | Freq: Every evening | ORAL | Status: DC | PRN

## 2019-04-15 MED ORDER — ACETAMINOPHEN 325 MG PO TABS: 650.0000 mg | ORAL_TABLET | Freq: Four times a day (QID) | ORAL | Status: DC | PRN

## 2019-04-15 MED ORDER — IOHEXOL 300 MG/ML  SOLN
75.0000 mL | Freq: Once | INTRAMUSCULAR | Status: AC | PRN
  Administered 2019-04-15: 17:00:00 75 mL via INTRAVENOUS

## 2019-04-15 MED ORDER — ACETAMINOPHEN 650 MG RE SUPP: 650.0000 mg | Freq: Four times a day (QID) | RECTAL | Status: DC | PRN

## 2019-04-15 MED ORDER — CITALOPRAM HYDROBROMIDE 20 MG PO TABS
20.0000 mg | ORAL_TABLET | Freq: Every day | ORAL | Status: DC
  Administered 2019-04-15 – 2019-04-16 (×2): 20 mg via ORAL
  Filled 2019-04-15 (×2): qty 1

## 2019-04-15 MED ORDER — SODIUM CHLORIDE 0.9 % IV BOLUS
1000.0000 mL | Freq: Once | INTRAVENOUS | Status: AC
  Administered 2019-04-15: 1000 mL via INTRAVENOUS

## 2019-04-15 MED ORDER — PREDNISOLONE ACETATE 1 % OP SUSP
1.0000 [drp] | Freq: Four times a day (QID) | OPHTHALMIC | Status: DC
  Administered 2019-04-15 – 2019-04-19 (×13): 1 [drp] via OPHTHALMIC
  Filled 2019-04-15: qty 5

## 2019-04-15 MED ORDER — ALBUTEROL SULFATE HFA 108 (90 BASE) MCG/ACT IN AERS: 1.0000 | INHALATION_SPRAY | Freq: Four times a day (QID) | RESPIRATORY_TRACT | Status: DC | PRN

## 2019-04-15 MED ORDER — HYDROXYZINE HCL 25 MG PO TABS
25.0000 mg | ORAL_TABLET | Freq: Three times a day (TID) | ORAL | Status: DC | PRN
  Administered 2019-04-15: 25 mg via ORAL
  Filled 2019-04-15: qty 1

## 2019-04-15 NOTE — Patient Instructions (Signed)
Due to your low blood pressure, increased heart rate and dizziness, I recommend that you be seen in the emergency room.  I will find out from Dr. Marylyn Ishihara whether or not we should give the flu vaccine and the pneumonia vaccine prior to her cancer treatment.

## 2019-04-15 NOTE — Telephone Encounter (Signed)
Call placed to [facility], spoke to [place] who stated that patient is receiving home health services with [facility]. They called the agency and confirmed.  Patient did not want to switch agencies    Call placed to [place], spoke to [place] who confirmed that patient is active with them and is receiving SN and PT.  Start of care was 04/05/2019.   Update provided to Dr Wynetta Emery

## 2019-04-15 NOTE — ED Triage Notes (Signed)
Pt states was dx with stage 4 lung CA 2 weeks ago.  [facility] and bp was low and she was c/o dizziness.  Sats of 100% on 3L.

## 2019-04-15 NOTE — ED Provider Notes (Addendum)
[facility] EMERGENCY DEPARTMENT Provider Note   CSN: [id number] Arrival date & time: 04/15/19  1213     History   Chief Complaint Chief Complaint  Patient presents with  . Dizziness  . Hypotension    HPI Kristen Chavez is a 68 y.o. female.  Presents emergency department with a chief complaint of shortness of breath, weakness, episodes of lightheadedness.  States symptoms have been progressive since discharge.  Denies any new chest pain.  States that she has been draining her chest tube as instructed every other day.  No change in the fluid that is been coming out.  Unsure how much it is, states her daughter does it.  No new dark stools, blood in stool.  Went to primary care doctor's office this morning, noted to have Low blood pressure and elevated heart rate, referred to ER for further evaluation.  Currently having no symptoms at rest.    HPI  Past Medical History:  Diagnosis Date  . Anxiety   . Arthritis    osteoarthritis.  . Cancer ([place])    lung  . Depression   . Hypertension     Patient Active Problem List   Diagnosis Date Noted  . Shortness of breath   . DNR (do not resuscitate) discussion   . Palliative care by specialist   . Pleural effusion, malignant   . 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 Mass of upper lobe of right lung 03/27/2019  . Pleural effusion   . Corn of toe 12/11/2017  . Weight loss, unintentional 12/11/2017  . Marijuana use 12/11/2017  . Smoking 08/26/2013  . ANXIETY 11/25/2007  . DEPRESSION 11/25/2007  . Essential hypertension 11/25/2007  . Osteoarthritis 11/25/2007  . 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 CHEST TUBE INSERTION Right 04/01/2019   Procedure: INSERTION PLEURAL DRAINAGE CATHETER;  Surgeon: [name], MD;  Location: [facility];  Service: Thoracic;  Laterality: Right;  . TONSILLECTOMY       OB History   No obstetric history on  file.      Home Medications    Prior to Admission medications   Medication Sig Start Date End Date Taking? Authorizing Provider  acetaminophen (TYLENOL) 500 MG tablet Take 1 tablet (500 mg total) by mouth every 6 (six) hours as needed for moderate pain or headache. 08/18/17   Alfonse Spruce, FNP  albuterol (VENTOLIN HFA) 108 (90 Base) MCG/ACT inhaler Inhale 1-2 puffs into the lungs every 6 (six) hours as needed for wheezing or shortness of breath. 04/03/19   Santos-Sanchez, Merlene Morse, MD  buPROPion (WELLBUTRIN XL) 300 MG 24 hr tablet TAKE 1 TABLET (300 MG TOTAL) BY MOUTH DAILY. 03/06/19   [name], MD  cetirizine (ZYRTEC) 10 MG tablet Take 1 tablet (10 mg total) by mouth daily. 01/07/19   Charlott Rakes, MD  citalopram (CELEXA) 40 MG tablet Take 1 tablet (40 mg total) by mouth daily. Patient taking differently: Take 20 mg by mouth daily.  02/21/19   Argentina Donovan, PA-C  fluticasone (FLONASE) 50 MCG/ACT nasal spray Place 2 sprays into both nostrils daily. 01/07/19   Charlott Rakes, MD  hydrOXYzine (ATARAX/VISTARIL) 25 MG tablet Take 1 tablet (25 mg total) by mouth 3 (three) times daily as needed. 04/04/19   Jeanmarie Hubert, MD  Lavender Oil OIL Apply 1 application topically 2 (two) times daily as needed (knee pain).    [provider]  methotrexate ([place])  2.5 MG tablet Take 25 mg by mouth every Friday. Caution:Chemotherapy. Protect from light.     [provider]  prednisoLONE acetate (PRED FORTE) 1 % ophthalmic suspension Place 1 drop into the left eye 4 (four) times daily.    [provider]  traZODone (DESYREL) 50 MG tablet Take 0.5-1 tablets (25-50 mg total) by mouth at bedtime as needed for sleep. 02/21/19   Argentina Donovan, PA-C    Family History Family History  Problem Relation Age of Onset  . Hypertension Mother   . Rheum arthritis Mother   . Other Father        suicide  . Diabetes Paternal Grandmother   . Diabetes Paternal  Grandfather     Social History Social History   Tobacco Use  . Smoking status: Former Smoker    Packs/day: 0.25    Types: Cigarettes    Quit date: 04/14/2014    Years since quitting: 5.0  . Smokeless tobacco: Never Used  Substance Use Topics  . Alcohol use: Not Currently    Alcohol/week: 21.0 standard drinks    Types: 21 Glasses of wine per week    Comment: socially wine  . Drug use: Not Currently    Types: Marijuana     Allergies   Patient has no known allergies.   Review of Systems Review of Systems  Constitutional: Positive for fatigue. Negative for chills and fever.  HENT: Negative for ear pain and sore throat.   Eyes: Negative for pain and visual disturbance.  Respiratory: Positive for shortness of breath. Negative for cough.   Cardiovascular: Negative for chest pain and palpitations.  Gastrointestinal: Negative for abdominal pain and vomiting.  Genitourinary: Negative for dysuria and hematuria.  Musculoskeletal: Negative for arthralgias and back pain.  Skin: Negative for color change and rash.  Neurological: Negative for seizures and syncope.  All other systems reviewed and are negative.    Physical Exam Updated Vital Signs BP 94/76   Pulse 98   Temp 98.9 F (37.2 C) (Oral)   Resp 20   SpO2 100%   Physical Exam Vitals signs and nursing note reviewed.  Constitutional:      General: She is not in acute distress.    Appearance: She is well-developed.     Comments: Chronically ill-appearing  HENT:     Head: Normocephalic and atraumatic.  Eyes:     Conjunctiva/sclera: Conjunctivae normal.  Neck:     Musculoskeletal: Neck supple.  Cardiovascular:     Rate and Rhythm: Regular rhythm. Tachycardia present.     Heart sounds: No murmur.  Pulmonary:     Effort: Pulmonary effort is normal. No respiratory distress.     Comments: Somewhat decreased breath sounds on right side Abdominal:     Palpations: Abdomen is soft.     Tenderness: There is no  abdominal tenderness.  Musculoskeletal:        General: No swelling or tenderness.  Skin:    General: Skin is warm and dry.     Capillary Refill: Capillary refill takes less than 2 seconds.  Neurological:     General: No focal deficit present.     Mental Status: She is alert and oriented to person, place, and time.  Psychiatric:        Mood and Affect: Mood normal.        Behavior: Behavior normal.      ED Treatments / Results  Labs (all labs ordered are listed, but only abnormal results are displayed)  Labs Reviewed  CBC - Abnormal; Notable for the following components:      Result Value   WBC 12.4 (*)    RBC 2.78 (*)    Hemoglobin 7.0 (*)    HCT 22.2 (*)    MCV 79.9 (*)    MCH 25.2 (*)    RDW 15.9 (*)    Platelets 594 (*)    All other components within normal limits  COMPREHENSIVE METABOLIC PANEL - Abnormal; Notable for the following components:   Glucose, Bld 120 (*)    Calcium 8.7 (*)    Total Protein 6.3 (*)    Albumin 2.5 (*)    Total Bilirubin 0.2 (*)    GFR calc non Af Amer 60 (*)    All other components within normal limits  LACTIC ACID, PLASMA - Abnormal; Notable for the following components:   Lactic Acid, Venous 2.3 (*)    All other components within normal limits  SARS CORONAVIRUS 2 (TAT 6-24 HRS)  LACTIC ACID, PLASMA  TYPE AND SCREEN  TROPONIN I (HIGH SENSITIVITY)  TROPONIN I (HIGH SENSITIVITY)    EKG EKG Interpretation  Date/Time:  Monday April 15 2019 15:30:21 EDT Ventricular Rate:  101 PR Interval:    QRS Duration: 89 QT Interval:  334 QTC Calculation: 433 R Axis:   56 Text Interpretation:  Sinus tachycardia Borderline T wave abnormalities Confirmed by Madalyn Rob [phone]) on 04/15/2019 4:25:23 PM   Radiology Dg Chest 2 View  Result Date: 04/15/2019 CLINICAL DATA:  Chest pain and dizziness. Lung carcinoma. EXAM: CHEST - 2 VIEW COMPARISON:  04/02/2019 FINDINGS: Heart size remains normal. Left chest tube remains in place. Moderate  right pleural effusion is increased in size since previous study. No pneumothorax visualized. Multiple base masses are again seen in the right lung apex, without significant change. Mediastinal and right hilar lymphadenopathy remains stable. Left lung remains clear. IMPRESSION: Moderate right pleural effusion, increased in size since prior study. Right chest tube remains in place. Multiple pleural base masses in right lung apex, without significant change. No significant change in mediastinal and right hilar lymphadenopathy. Electronically Signed   By: Marlaine Hind M.D.   On: 04/15/2019 13:18    Procedures .Critical Care Performed by: Lucrezia Starch, MD Authorized by: Lucrezia Starch, MD   Critical care provider statement:    Critical care time (minutes):  36   Critical care was necessary to treat or prevent imminent or life-threatening deterioration of the following conditions:  Respiratory failure and circulatory failure   Critical care was time spent personally by me on the following activities:  Discussions with consultants, evaluation of patient's response to treatment, examination of patient, ordering and performing treatments and interventions, ordering and review of laboratory studies, ordering and review of radiographic studies, pulse oximetry, re-evaluation of patient's condition, obtaining history from patient or surrogate and review of old charts   (including critical care time)  Medications Ordered in ED Medications  sodium chloride flush (NS) 0.9 % injection 3 mL (has no administration in time range)  sodium chloride 0.9 % bolus 1,000 mL (1,000 mLs Intravenous New Bag/Given 04/15/19 1541)  iohexol (OMNIPAQUE) 300 MG/ML solution 75 mL (75 mLs Intravenous Contrast Given 04/15/19 1655)     Initial Impression / Assessment and Plan / ED Course  I have reviewed the triage vital signs and the nursing notes.  Pertinent labs & imaging results that were available during my care of  the patient were reviewed by me and considered in  my medical decision making (see chart for details).  Clinical Course as of Apr 14 1701  Mon Apr 15, 2019  1624 Discussed case with internal medicine team who will evaluate patient, Dr. Myrtie Hawk   [RD]    Clinical Course User Index [RD] Lucrezia Starch, MD       68 year old lady recent mission for newly diagnosed adenocarcinoma of the lung, right pleural effusion s/p chest tube placement, anemia s/p transfusion on prior admission, bladder mass presents to ER with reported low blood pressure and symptoms of weakness, shortness of breath.  On exam patient is chronically ill-appearing but in no distress.  Chest x-ray is concerning for persistent right pleural effusion, chest tube still in place and is reportedly functioning appropriately.  Labs were concerning for worsening anemia.  Suspect this is most likely etiology of her symptoms today versus underlying malignancy.  Given her level of anemia believe she would benefit from inpatient admission for further management.  Patient requests that other measures to be considered before receiving transfusion given her [employer]'s [employer] faith however she is open to blood transfusions if that is what we recommend.  Will defer decision for transfusion versus observation to admitting team.  Ordered CTA chest to rule out pulmonary embolism.  Discussed case with internal medicine resident team who will evaluate patient for admission.  Anticipate admission to their service.  While awaiting for their evaluation and results of CTA chest, patient signed out to Dr. Francia Greaves.    Final Clinical Impressions(s) / ED Diagnoses   Final diagnoses:  Anemia, unspecified type  Malignant neoplasm of lung, unspecified laterality, unspecified part of lung (HCC)  Pleural effusion  Elevated lactic acid level    ED Discharge Orders    None       Lucrezia Starch, MD 04/15/19 1702    Lucrezia Starch, MD 04/27/19  [phone]

## 2019-04-15 NOTE — Progress Notes (Signed)
Patient ID: LAGINA READER, female    DOB: 03-10-1951  MRN: [id number]  CC: Follow-up   Subjective: Kristen Chavez is a 68 y.o. female who presents for.  Daughter, Chrys Racer, is with her.   Her concerns today include:  Pt with hx of HTN, dep/anxiety, tobacco dependence, anemia of chronic disease, anxiety  This is a hospital follow-up visit.  Patient hospitalized 9/15-24/2020 with shortness of breath.  Found to have right upper lobe mass, extensive adenopathy and multifocal pleural metastatic disease as well as large right pleural effusion.  Thoracentesis was done and fluid analysis positive for adenocarcinoma.  CT of the brain and abdomen and pelvis demonstrated mass in the urinary bladder.  Due to increased oxygen requirement and reaccumulation of fluid, pleural catheter was placed.  Patient was discharged home with the pleural catheter with instructions to drain fluid every other day.  She was also sent home on O2 2 L continuous.  She developed worsening anemia.  Iron studies consistent with anemia of chronic disease.  Patient is Jehovah's Witness but agreed to transfusion of 1 unit of packed red blood cells.  Hemoglobin on discharge was 8.8.  Due to blood pressure being low, blood pressure medications HCTZ and lisinopril were held.  Patient was seen by oncologist Dr. Marylyn Ishihara and palliative care.  Today: Daughter does the pleural catheter drainage every other day.  She has been getting back about 600 cc of bloody fluid.  She remains on 2 L O2 continuous.  Has appointment with Dr. Marylyn Ishihara in 2 days.  She is inquiring about palliative care. -Reports decreased appetite and problems moving her bowel.  Endorses shortness of breath, dizziness especially when she tries to get up. -She has not been taking blood pressure medications as she was instructed to hold off on taking them upon discharge from the hospital. -She has appointment scheduled with urologist later this month.  Denies any hematuria -Home health  was trying to initiate services but she reportedly would not let them in.  Patient states that she was a bit rude to the person who came to the house last week but she is wanting and needing home health services.  Patient Active Problem List   Diagnosis Date Noted  . Shortness of breath   . DNR (do not resuscitate) discussion   . Palliative care by specialist   . Pleural effusion, malignant   . Adenocarcinoma ([place]) 03/29/2019  . Mass of upper lobe of right lung 03/27/2019  . Pleural effusion   . Corn of toe 12/11/2017  . Weight loss, unintentional 12/11/2017  . Marijuana use 12/11/2017  . Smoking 08/26/2013  . ANXIETY 11/25/2007  . DEPRESSION 11/25/2007  . Essential hypertension 11/25/2007  . Osteoarthritis 11/25/2007  . 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    Current Outpatient Medications on File Prior to Visit  Medication Sig Dispense Refill  . acetaminophen (TYLENOL) 500 MG tablet Take 1 tablet (500 mg total) by mouth every 6 (six) hours as needed for moderate pain or headache. 30 tablet 1  . albuterol (VENTOLIN HFA) 108 (90 Base) MCG/ACT inhaler Inhale 1-2 puffs into the lungs every 6 (six) hours as needed for wheezing or shortness of breath. 8 g 1  . buPROPion (WELLBUTRIN XL) 300 MG 24 hr tablet TAKE 1 TABLET (300 MG TOTAL) BY MOUTH DAILY. 30 tablet 2  . cetirizine (ZYRTEC) 10 MG tablet Take 1 tablet (10 mg total)  by mouth daily. 30 tablet 1  . citalopram (CELEXA) 40 MG tablet Take 1 tablet (40 mg total) by mouth daily. (Patient taking differently: Take 20 mg by mouth daily. ) 30 tablet 2  . fluticasone (FLONASE) 50 MCG/ACT nasal spray Place 2 sprays into both nostrils daily. 16 g 1  . hydrOXYzine (ATARAX/VISTARIL) 25 MG tablet Take 1 tablet (25 mg total) by mouth 3 (three) times daily as needed. 90 tablet 0  . Lavender Oil OIL Apply 1 application topically 2 (two) times daily as needed  (knee pain).    . methotrexate (RHEUMATREX) 2.5 MG tablet Take 25 mg by mouth every Friday. Caution:Chemotherapy. Protect from light.     . prednisoLONE acetate (PRED FORTE) 1 % ophthalmic suspension Place 1 drop into the left eye 4 (four) times daily.    . traZODone (DESYREL) 50 MG tablet Take 0.5-1 tablets (25-50 mg total) by mouth at bedtime as needed for sleep. 30 tablet 3    No Known Allergies  Social History   Socioeconomic History  . Marital status: Single    Spouse name: Not on file  . Number of children: Not on file  . Years of education: Not on file  . Highest education level: Not on file  Occupational History  . Not on file  Social Needs  . Financial resource strain: Not on file  . Food insecurity    Worry: Not on file    Inability: Not on file  . Transportation needs    Medical: Not on file    Non-medical: Not on file  Tobacco Use  . Smoking status: Former Smoker    Packs/day: 0.25    Types: Cigarettes    Quit date: 04/14/2014    Years since quitting: 5.0  . Smokeless tobacco: Never Used  Substance and Sexual Activity  . Alcohol use: Not Currently    Alcohol/week: 21.0 standard drinks    Types: 21 Glasses of wine per week    Comment: socially wine  . Drug use: Not Currently    Types: Marijuana  . Sexual activity: Never  Lifestyle  . Physical activity    Days per week: Not on file    Minutes per session: Not on file  . Stress: Not on file  Relationships  . Social Herbalist on phone: Not on file    Gets together: Not on file    Attends religious service: Not on file    Active member of club or organization: Not on file    Attends meetings of clubs or organizations: Not on file    Relationship status: Not on file  . Intimate partner violence    Fear of current or ex partner: Not on file    Emotionally abused: Not on file    Physically abused: Not on file    Forced sexual activity: Not on file  Other Topics Concern  . Not on file  Social  History Narrative  . Not on file    Family History  Problem Relation Age of Onset  . Hypertension Mother   . Rheum arthritis Mother   . Other Father        suicide  . Diabetes Paternal Grandmother   . Diabetes Paternal Grandfather     Past Surgical History:  Procedure Laterality Date  . CHEST TUBE INSERTION Right 04/01/2019   Procedure: INSERTION PLEURAL DRAINAGE CATHETER;  Surgeon: [name], MD;  Location: [facility];  Service: Thoracic;  Laterality: Right;  .  TONSILLECTOMY      ROS: Review of Systems Negative except as stated above  PHYSICAL EXAM: BP (!) 70/50   Pulse (!) 112   Temp 97.9 F (36.6 C) (Oral)   Resp 18   Ht 5\' 2"  (1.575 m)   Wt 133 lb (60.3 kg)   SpO2 100% Comment: 3L  BMI 24.33 kg/m   Wt Readings from Last 3 Encounters:  04/15/19 133 lb (60.3 kg)  04/04/19 134 lb 1.6 oz (60.8 kg)  12/11/17 143 lb 12.8 oz (65.2 kg)    Physical Exam  General appearance -patient is in wheelchair.  She appears uncomfortable.  She is wearing her oxygen. Mental status - normal mood, behavior, speech, dress, motor activity, and thought processes Mouth - mucous membranes moist, pharynx normal without lesions Neck - supple, no significant adenopathy Chest -breath sounds slightly decreased at the right base.  Other lung fields clear Heart -tachycardic but regular Extremities -no lower extremity edema  CMP Latest Ref Rng & Units 04/02/2019 04/01/2019 03/28/2019  Glucose 70 - 99 mg/dL 101(H) 128(H) 96  BUN 8 - 23 mg/dL 12 13 13   Creatinine 0.44 - 1.00 mg/dL 1.04(H) 1.11(H) 0.84  Sodium 135 - 145 mmol/L 136 137 136  Potassium 3.5 - 5.1 mmol/L 4.7 4.2 3.7  Chloride 98 - 111 mmol/L 106 102 104  CO2 22 - 32 mmol/L 22 25 21(L)  Calcium 8.9 - 10.3 mg/dL 8.1(L) 8.6(L) 8.5(L)  Total Protein 6.5 - 8.1 g/dL - - -  Total Bilirubin 0.3 - 1.2 mg/dL - - -  Alkaline Phos 38 - 126 U/L - - -  AST 15 - 41 U/L - - -  ALT 0 - 44 U/L - - -   Lipid Panel     Component Value  Date/Time   CHOL 166 02/10/2017 1202   TRIG 68 02/10/2017 1202   HDL 84 02/10/2017 1202   CHOLHDL 2.0 02/10/2017 1202   CHOLHDL 2.1 08/26/2013 1235   VLDL 17 08/26/2013 1235   LDLCALC 68 02/10/2017 1202   LDLDIRECT 51 04/15/2010 2018    CBC    Component Value Date/Time   WBC 12.4 (H) 04/15/2019 1227   RBC 2.78 (L) 04/15/2019 1227   HGB 7.0 (L) 04/15/2019 1227   HCT 22.2 (L) 04/15/2019 1227   PLT 594 (H) 04/15/2019 1227   MCV 79.9 (L) 04/15/2019 1227   MCH 25.2 (L) 04/15/2019 1227   MCHC 31.5 04/15/2019 1227   RDW 15.9 (H) 04/15/2019 1227   LYMPHSABS 1.3 03/26/2019 2230   MONOABS 1.1 (H) 03/26/2019 2230   EOSABS 0.2 03/26/2019 2230   BASOSABS 0.1 03/26/2019 2230    ASSESSMENT AND PLAN: 1. Hospital discharge follow-up 2. Primary malignant neoplasm of right lung metastatic to other site ([place]) 3. Dependence on continuous supplemental oxygen 4. Mass of urinary bladder -Patient plans to keep appointment with Dr. Irene Limbo later this week and with the urologist. -Message sent to care coordinator to inquire about palliative care.  They did see her while she was in the hospital.  I told patient she can also inquire about this when she sees the oncologist this week. -Patient would like to continue home health services.  I will have our caseworker call [facility]  5. Hypotension due to hypovolemia Patient sent to the hospital as she is hypotensive, tachycardic and dizzy.  She reports poor oral intake.  She may also have worsening anemia  6. Anemia, chronic disease Transfused 1 unit of blood cell while in the hospital.  Patient is Jehovah's Witness  7. Poor appetite Prescription given for boost shakes to supplement meals.  HM: We will send message to Dr. Irene Limbo inquiring whether it would be okay for [place] to give her the flu and pneumonia vaccines  Patient was given the opportunity to ask questions.  Patient verbalized understanding of the plan and was able to repeat key elements of the  plan.   No orders of the defined types were placed in this encounter.    Requested Prescriptions    No prescriptions requested or ordered in this encounter    Return in about 6 weeks (around 05/27/2019).  Karle Plumber, MD, FACP

## 2019-04-15 NOTE — Progress Notes (Signed)
   Vital Signs MEWS/VS Documentation      04/15/2019 1853 04/15/2019 1854 04/15/2019 1900 04/15/2019 1923   MEWS Score:  2  2  2  4    MEWS Score Color:  Yellow  Yellow  Yellow  Red   Resp:  19  -  -  (!) 23   Pulse:  (!) 104  -  -  (!) 115   BP:  97/67  -  -  97/67   Temp:  99 F (37.2 C)  -  -  99.1 F (37.3 C)   O2 Device:  Nasal Cannula  -  -  Nasal Cannula   O2 Flow Rate (L/min):  3 L/min  -  -  3 L/min   Level of Consciousness:  -  Alert  -  -           Cathaleen Korol Bryn Gulling 04/15/2019,8:19 PM

## 2019-04-15 NOTE — H&P (Addendum)
Date: 04/15/2019               Patient Name:  Kristen Chavez MRN: [id number]  DOB: 1950/11/08 Age / Sex: 68 y.o., female   PCP: [name], MD         Medical Service: Internal Medicine Teaching Service         Attending Physician: Dr. Bartholomew Crews, MD    First Contact: Dr. Gilford Rile Pager: [phone]  Second Contact: Dr. Myrtie Hawk Pager: [phone]       After Hours (After 5p/  First Contact Pager: [phone]  weekends / holidays): Second Contact Pager: [phone]   Chief Complaint: dizziness and hypotension  History of Present Illness: Kristen Chavez is a 68 yo Female with a PMHx notable for adenocarcinoma of the lung, malignant pleural effusion, Hx of tobacco use disorder, HTN, OA who presented with dizziness and hypotension. The patient stated that since discharge she has been generally weak, fatigued to the point that although she is hungry she does not have the energy to eat. As such, she has consumed very little food and even less fluid. She snacks lightly at best and could not quantify the volume she consumed. She continues to have serosanguinous drainage from the pleural catheter at approximately 3L of output since discharge. She notes on average 663mls daily on five separate occassions. She is due today to drain the catheter. The patient denied falls or LOC, fever, chills, headache, visual changes, nausea, vomiting, diarrhea, abdominal pain, chest pain, joint pain, dysuria, hematuria, diarrhea, constipation, or other discomfort. She endorses associated symptoms of dizziness with standing that has progressively worsened since discharge.  The patient was recently admitted and diagnosed with adenocarcinoma in the pleural fluid associated with a lung mass and bladder mass. She was seen by Dr. Irene Limbo with Heme/Onc and was to follow up with him on October 7th. She was seen by Palliative care on her last admission and had requested to be full code at that time and wanted to pursue full  treatment.   Meds:  Current Outpatient Medications  Medication Instructions  . acetaminophen (TYLENOL) 500 mg, Oral, Every 6 hours PRN  . albuterol (VENTOLIN HFA) 108 (90 Base) MCG/ACT inhaler 1-2 puffs, Inhalation, Every 6 hours PRN  . buPROPion (WELLBUTRIN XL) 300 mg, Oral, Daily  . cetirizine (ZYRTEC) 10 mg, Oral, Daily  . citalopram (CELEXA) 40 mg, Oral, Daily  . fluticasone (FLONASE) 50 MCG/ACT nasal spray 2 sprays, Each Nare, Daily  . hydrOXYzine (ATARAX/VISTARIL) 25 mg, Oral, 3 times daily PRN  . Lavender Oil OIL 1 application, Topical, 2 times daily PRN  . methotrexate (RHEUMATREX) 25 mg, Oral, Every Fri, Caution:Chemotherapy. Protect from light.  . prednisoLONE acetate (PRED FORTE) 1 % ophthalmic suspension 1 drop, Left Eye, 4 times daily  . traZODone (DESYREL) 25-50 mg, Oral, At bedtime PRN   No outpatient medications have been marked as taking for the 04/15/19 encounter [facility] Encounter).   Allergies: Allergies as of 04/15/2019  . (No Known Allergies)   Past Medical History:  Diagnosis Date  . Anxiety   . Arthritis    osteoarthritis.  . Cancer ([place])    lung  . Depression   . Hypertension    Family History:  Family History  Problem Relation Age of Onset  . Hypertension Mother   . Rheum arthritis Mother   . Other Father        suicide  . Diabetes Paternal Grandmother   . Diabetes Paternal Grandfather  Social History:  Social History   Tobacco Use  . Smoking status: Former Smoker    Packs/day: 0.25    Types: Cigarettes    Quit date: 04/14/2014    Years since quitting: 5.0  . Smokeless tobacco: Never Used  Substance Use Topics  . Alcohol use: Not Currently    Alcohol/week: 21.0 standard drinks    Types: 21 Glasses of wine per week    Comment: socially wine  . Drug use: Not Currently    Types: Marijuana   Review of Systems: A complete ROS was negative except as per HPI.   Physical Exam: Blood pressure 118/84, pulse (!) 104, temperature 98.9 F  (37.2 C), temperature source Oral, resp. rate 20, SpO2 100 %. Physical Exam Vitals signs and nursing note reviewed.  Constitutional:      General: She is not in acute distress.    Appearance: She is well-developed. She is not diaphoretic.  HENT:     Head: Normocephalic and atraumatic.     Mouth/Throat:     Pharynx: No oropharyngeal exudate.  Eyes:     Conjunctiva/sclera: Conjunctivae normal.  Neck:     Musculoskeletal: Normal range of motion and neck supple.  Cardiovascular:     Rate and Rhythm: Regular rhythm. Tachycardia present.     Heart sounds: No murmur.  Pulmonary:     Effort: Pulmonary effort is normal. No respiratory distress.     Breath sounds: Examination of the right-middle field reveals decreased breath sounds. Examination of the right-lower field reveals decreased breath sounds. Decreased breath sounds present.  Abdominal:     General: Bowel sounds are normal. There is no distension.     Palpations: Abdomen is soft.     Tenderness: There is no abdominal tenderness.  Musculoskeletal:        General: No tenderness.     Right lower leg: No edema.     Left lower leg: No edema.  Skin:    General: Skin is warm.     Capillary Refill: Capillary refill takes less than 2 seconds.  Neurological:     Mental Status: She is alert and oriented to person, place, and time.    EKG: personally reviewed my interpretation is sinus tachycardia  CXR: personally reviewed my interpretation is there is a significant right sided pleural effusion that appears to be slightly larger than he prior.  Assessment & Plan by Problem: Active Problems:   Adenocarcinoma ([facility])   Pleural effusion, malignant   Symptomatic anemia  Assessment:  Kristen Chavez is a 68 yo Female with a PMHx notable for newly diagnosed adenocarcinoma of the lung, malignant pleural effusion, Hx of tobacco use disorder, HTN, OA who presented with dizziness and hypotension. I am concerned that the patients anemia and poor  oral intake has lead to her fatigue and hypotension in the setting of adenocarcinoma of the lung. It appears that she has continued to produce a significant quantity of serosanguinous drainage from the Pleurx catheter which likely explained the worsening anemia. I do not see overt signs indication infection at this time aside from a mild leukocytosis. Her lactate cleared after rehydration.   Plan: Symptomatic anemia: Most likely 2/2 to ongoing serosanguineous drainage from her Pleurx catheter and poor nutrition in the setting of malignant carcinoma. She has a low MCV but her iron studies were most consistent with anemia of chronic disease. Hgb 7.0 but likely hemoconcentrated. We will need to consider an appetite stimulant but will need to discuss risk/benefits of Megace  with her on rounds.  --Type and screen completed. --Transfuse one unit of PRBC's --Nurse to draw post trans H&H --CBC in am to confirm stabilization --Regular diet   Pleural Effusion, Malignant: Pleurx placed 2/2 to rapid reocurrance of her malignant pleural effusion. Continues to drain. Will need drained Q48 hours while admitted. --Care order placed to nursing to drain pleural fluid with vacuum container.  --Patient able to assist in this process --Was to see TCTS on th 7th for catheter care   Adenocarcinoma: Was to follow with Dr. Irene Limbo on 10/07 for her initial visit. She may be able to make this if she improves rapidly but I suspect she may not.  --Discuss consulting Dr. Irene Limbo if admitted past the 7th.  --Continue hydroxyzine and bupropion for anxiety  Hypotension: Hypotensive to systolic of 45'E at her PCP visit. Bolus in the ED. Remained minimally hypotensive after the bolus.  --Repeat bolus at 525ml/hr --Regular diet --BMP in am  Diet: Regular Code: DNR (Patient stated, I want to pass in peace, please do not resuscitate me) DVT PPX: SCD's  Dispo: Admit patient to Inpatient with expected length of stay greater than 2  midnights.  Signed: Kathi Ludwig, MD 04/15/2019, 6:15 PM  Pager: # [phone]

## 2019-04-16 ENCOUNTER — Other Ambulatory Visit: Payer: Self-pay | Admitting: Cardiothoracic Surgery

## 2019-04-16 ENCOUNTER — Telehealth: Payer: Self-pay | Admitting: Hematology

## 2019-04-16 DIAGNOSIS — D510 Vitamin B12 deficiency anemia due to intrinsic factor deficiency: Secondary | ICD-10-CM

## 2019-04-16 DIAGNOSIS — J910 Malignant pleural effusion: Secondary | ICD-10-CM

## 2019-04-16 LAB — HEMOGLOBIN AND HEMATOCRIT, BLOOD
HCT: 21 % — ABNORMAL LOW (ref 36.0–46.0)
HCT: 25.6 % — ABNORMAL LOW (ref 36.0–46.0)
Hemoglobin: 7 g/dL — ABNORMAL LOW (ref 12.0–15.0)
Hemoglobin: 8.7 g/dL — ABNORMAL LOW (ref 12.0–15.0)

## 2019-04-16 LAB — COMPREHENSIVE METABOLIC PANEL
ALT: 19 U/L (ref 0–44)
AST: 16 U/L (ref 15–41)
Albumin: 1.9 g/dL — ABNORMAL LOW (ref 3.5–5.0)
Alkaline Phosphatase: 80 U/L (ref 38–126)
Anion gap: 6 (ref 5–15)
BUN: 5 mg/dL — ABNORMAL LOW (ref 8–23)
CO2: 21 mmol/L — ABNORMAL LOW (ref 22–32)
Calcium: 7.5 mg/dL — ABNORMAL LOW (ref 8.9–10.3)
Chloride: 113 mmol/L — ABNORMAL HIGH (ref 98–111)
Creatinine, Ser: 0.82 mg/dL (ref 0.44–1.00)
GFR calc Af Amer: >60 mL/min (ref >60)
GFR calc non Af Amer: >60 mL/min (ref >60)
Glucose, Bld: 99 mg/dL (ref 70–99)
Potassium: 4.1 mmol/L (ref 3.5–5.1)
Sodium: 140 mmol/L (ref 135–145)
Total Bilirubin: <0.1 mg/dL — ABNORMAL LOW (ref 0.3–1.2)
Total Protein: 4.7 g/dL — ABNORMAL LOW (ref 6.5–8.1)

## 2019-04-16 LAB — PREPARE RBC (CROSSMATCH)

## 2019-04-16 MED ORDER — SODIUM CHLORIDE 0.9 % IV SOLN
INTRAVENOUS | Status: DC
  Administered 2019-04-16 – 2019-04-17 (×3): via INTRAVENOUS

## 2019-04-16 MED ORDER — SODIUM CHLORIDE 0.9 % IV BOLUS
1000.0000 mL | Freq: Once | INTRAVENOUS | Status: AC
  Administered 2019-04-16: 1000 mL via INTRAVENOUS

## 2019-04-16 MED ORDER — SODIUM CHLORIDE 0.9% IV SOLUTION
Freq: Once | INTRAVENOUS | Status: AC
  Administered 2019-04-16: 21:00:00 via INTRAVENOUS

## 2019-04-16 MED ORDER — MIRTAZAPINE 15 MG PO TABS
15.0000 mg | ORAL_TABLET | Freq: Every day | ORAL | Status: DC
  Administered 2019-04-16 – 2019-04-18 (×3): 15 mg via ORAL
  Filled 2019-04-16 (×3): qty 1

## 2019-04-16 NOTE — Plan of Care (Signed)
  Problem: Clinical Measurements: Goal: Will remain free from infection Outcome: Progressing   Problem: Activity: Goal: Risk for activity intolerance will decrease Outcome: Progressing   Problem: Safety: Goal: Ability to remain free from injury will improve Outcome: Progressing   

## 2019-04-16 NOTE — Progress Notes (Signed)
   Subjective:  The patient stated that she felt greatly improved today. Denied dizziness while sitting. Has not attempted to ambulate due to her fear of symptom recurrence. She is in agreement with remaining hospitalized until tomorrow for reevaluation of her dizziness following PT/OT recs.   Objective:  Vital signs in last 24 hours: Vitals:   04/16/19 0348 04/16/19 0402 04/16/19 0436 04/16/19 0459  BP: (!) 88/66 (!) 87/65 99/70 98/73   Pulse: 85 83  86  Resp: 17 17 19 19   Temp:  98.6 F (37 C) 98.8 F (37.1 C) 98.6 F (37 C)  TempSrc:  Oral Oral   SpO2: 100% 100% 97%   Weight:      Height:       General: A/O x4, in no acute distress, afebrile, nondiaphoretic Cardio: RRR, no mrg's  Pulmonary: Decreased breath sound on the right lower lung field, improved from the prior day Abdomen: Bowel sounds normal, soft, nontender  MSK: BLE nontender, nonedematous Psych: Appropriate affect, not depressed in appearance, engages well  Patient Encounter Summary: Kristen Chavez is a 68 yo Female with a PMHx notable for newly diagnosed adenocarcinoma of the lung, malignant pleural effusion, Hx of tobacco use disorder, HTN, OA who presented with dizziness and hypotension. I am concerned that the patients anemia and poor oral intake has lead to her fatigue and hypotension in the setting of adenocarcinoma of the lung. It appears that she has continued to produce a significant quantity of serosanguinous drainage from the Pleurx catheter which likely explained the worsening anemia. Although greatly improved her BP remains soft overnight requiring several boluses of IV fluid. She will require ongoing observation to ensure stabilization.   Assessment/Plan:  Active Problems:   Adenocarcinoma (H[facility])   Pleural effusion, malignant   Symptomatic anemia  Plan: Symptomatic anemia: S/P transfusion with two units of PRBC's. Hgb now 8.7, expect this to drop slightly overnight to normalize.  --Transfused two  units of PRBC's --CBC in am to confirm stabilization --Regular diet --Mirtazapine   Pleural Effusion, Malignant: 768ml's drained overnight. Patient stated that TCTS will see her here. We will confirm this.  --Care order placed to nursing to drain pleural fluid with vacuum container.  --Patient able to assist in this process   Adenocarcinoma: Was to follow with Dr. Irene Limbo on 10/07 for her initial visit. She may need to   --Discuss consulting Dr. Irene Limbo if admitted past the 7th.  --Continue hydroxyzine and bupropion for anxiety  Hypotension: Hypotensive to systolic of 96'Q at her PCP visit. Bolus in the ED. Remained minimally hypotensive after the bolus.  --Repeat LR bolus PRN --Regular diet --BMP in am  Diet: Regular Code: DNR DVT PPX: SCD's Dispo: Anticipated discharge pending medical improvement.  Kathi Ludwig, MD [facility] Internal Medicine, PGY-3 Pager # [phone]

## 2019-04-16 NOTE — Telephone Encounter (Signed)
Returned patient's phone call regarding rescheduling an appointment, per patient's request 10/07 appointment has been cancelled and patient will call back once she is out of the hospital to reschedule.

## 2019-04-16 NOTE — Plan of Care (Signed)
Will continue to monitor.

## 2019-04-16 NOTE — Progress Notes (Signed)
Pleurex catheter drained of 800cc serous fluid, procedure well tolerated, breathing easily, continue to have SOB on exertion with intermittent wheezing, given 1 nebulizer treatment with good result, Patient somewhat tearful, worried about disease progression, emotional support given, Site is clean mild edema at suture site no exudate. Patient made comfortable, will continue to observe forchange.

## 2019-04-16 NOTE — Progress Notes (Signed)
Date: 04/16/2019  Patient name: Kristen Chavez  Medical record number: [id number]  Date of birth: 12-06-1950   I have seen and evaluated Kristen Chavez and discussed their care with the Residency Team. Kristen Chavez is a 68 year old community dwelling woman with recent diagnosis of adenocarcinoma of the lung with malignant pleural effusion requiring pleural drain who presented to the [name] health and wellness clinic for a hospital follow-up and complained of dizziness and weakness.  Other symptoms included anorexia, constipation, dyspnea, dizziness.  She was found to have a blood pressure of 70/50 with a heart rate of 112 and was admitted for further management.  She was found to have a hemoglobin of 7.0 and has since been transfused 2 units PRBCs.  Her hemoglobin is now 8.7. The pleural fluid which is being drained every other day at home has been bloody.   This morning, she has no symptoms of dizziness, lightheadedness, or fatigue although she has not been out of bed yet because she is concerned it will make her fatigued.  She still has a poor appetite but has a sandwich in front of her which she will try to eat.  She states her daughter will come later today and she will try to walk again.  She states she called her CVTS surgeon to alert him she is in the hospital and was told he would come and see her as an inpatient.  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  Hypoxia requiring continuous supplemental oxygen.  Vitals:   04/16/19 0722 04/16/19 0752  BP: 99/72 100/75  Pulse: 85 83  Resp: 17 19  Temp: 98.6 F (37 C) 98.5 F (36.9 C)  SpO2: 98% 99%  T-max 99.2 HRRR LCTAB with decreased breath sounds in the right base  Relevant labs Albumin 1.9 Ferritin 185, percent sat 7, TIBC 195, iron 13 Hemoglobin 8.8 on discharge, 7.0 on admission, status post 3 units PRBCs, now 8.7 MCV 80 Platelets 594  CT chest right upper lobe mass, bilateral mediastinal and hilar  lymphadenopathy and pleural masses, right pleural catheter in place with a decrease in the loculated large right pleural effusion  I personally viewed the CXR images and confirmed my reading with the official read. 2 view PA and lateral rotation, good penetration, good inspiration.  Large right pleural effusion with mass  I personally viewed the EKG and confirmed my reading with the official read.  Sinus tach, normal axis, nonspecific T wave changes in the lateral leads  Assessment and Plan: I have seen and evaluated the patient as outlined above. I agree with the formulated Assessment and Plan as detailed in the residents' note, with the following changes:  Kristen Chavez is a 68 year old community dwelling woman with recent diagnosis of adenocarcinoma of the lung with malignant pleural effusion requiring pleural drain.  She presented to her outpatient hospital follow-up appointment with dizziness and weakness and was found to have a blood pressure of 70/40 and a hemoglobin of 7.  She has responded well to 2 units PRBC and IV fluids and is currently asymptomatic while lying in bed.  The most likely source of her anemia is blood loss through the pleural fluid as this was documented when she had the thoracentesis last admission.  Her iron studies do not demonstrate iron deficiency.  1.  Dizziness, weakness, and hypotension - these are all likely secondary to blood loss anemia with the most likely source of the pleural fluid.  Her  pleural drain continues to drain serosanguineous fluid.  The lung mass is likely the ultimate etiology and CVTS will be evaluating the patient to discuss definitive treatment.  Sepsis was considered as another possibility but she has no signs or symptoms of infection.  We will see how she does with PT OT although she declined outpatient home health services at her last discharge.  2.  Symptomatic anemia of malignancy - follow hemoglobin trend after transfusion PRBCs.  3.  Malignant  pleural effusion and adenocarcinoma of the lung - f/o oncology as an outpatient  4.  Anorexia - I would be hesitant to use Megace as it is prothrombotic and she has an active malignancy.  Remeron can be tried to see if it will stimulate her appetite.  The underlying cause of her anorexia is likely her malignancy.  5.  Dispo -we will observe Kristen Chavez overnight and obtain a formal PT and OT consult.  She declined home health services after discharge but we want to ensure that she is safe to return home.  Kristen Crews, MD 10/6/20202:52 PM

## 2019-04-17 ENCOUNTER — Inpatient Hospital Stay: Payer: Self-pay | Admitting: Hematology

## 2019-04-17 ENCOUNTER — Ambulatory Visit: Payer: Self-pay | Admitting: Cardiothoracic Surgery

## 2019-04-17 DIAGNOSIS — F419 Anxiety disorder, unspecified: Secondary | ICD-10-CM

## 2019-04-17 DIAGNOSIS — F329 Major depressive disorder, single episode, unspecified: Secondary | ICD-10-CM

## 2019-04-17 DIAGNOSIS — J90 Pleural effusion, not elsewhere classified: Secondary | ICD-10-CM

## 2019-04-17 LAB — BPAM RBC
Blood Product Expiration Date: 202010132359
Blood Product Expiration Date: 202011062359
ISSUE DATE / TIME: 202010052150
ISSUE DATE / TIME: 202010060427
Unit Type and Rh: 5100
Unit Type and Rh: 5100

## 2019-04-17 LAB — COMPREHENSIVE METABOLIC PANEL
ALT: 17 U/L (ref 0–44)
AST: 15 U/L (ref 15–41)
Albumin: 1.9 g/dL — ABNORMAL LOW (ref 3.5–5.0)
Alkaline Phosphatase: 68 U/L (ref 38–126)
Anion gap: 6 (ref 5–15)
BUN: 6 mg/dL — ABNORMAL LOW (ref 8–23)
CO2: 20 mmol/L — ABNORMAL LOW (ref 22–32)
Calcium: 7.8 mg/dL — ABNORMAL LOW (ref 8.9–10.3)
Chloride: 114 mmol/L — ABNORMAL HIGH (ref 98–111)
Creatinine, Ser: 0.77 mg/dL (ref 0.44–1.00)
GFR calc Af Amer: >60 mL/min (ref >60)
GFR calc non Af Amer: >60 mL/min (ref >60)
Glucose, Bld: 89 mg/dL (ref 70–99)
Potassium: 3.6 mmol/L (ref 3.5–5.1)
Sodium: 140 mmol/L (ref 135–145)
Total Bilirubin: 1 mg/dL (ref 0.3–1.2)
Total Protein: 4.6 g/dL — ABNORMAL LOW (ref 6.5–8.1)

## 2019-04-17 LAB — TYPE AND SCREEN
ABO/RH(D): O POS
Antibody Screen: NEGATIVE
Unit division: 0
Unit division: 0

## 2019-04-17 LAB — CBC WITH DIFFERENTIAL/PLATELET
Abs Immature Granulocytes: 0.52 10*3/uL — ABNORMAL HIGH (ref 0.00–0.07)
Basophils Absolute: 0.1 10*3/uL (ref 0.0–0.1)
Basophils Relative: 1 %
Eosinophils Absolute: 0.5 10*3/uL (ref 0.0–0.5)
Eosinophils Relative: 5 %
HCT: 25.7 % — ABNORMAL LOW (ref 36.0–46.0)
Hemoglobin: 8.5 g/dL — ABNORMAL LOW (ref 12.0–15.0)
Immature Granulocytes: 6 %
Lymphocytes Relative: 11 %
Lymphs Abs: 1 10*3/uL (ref 0.7–4.0)
MCH: 27 pg (ref 26.0–34.0)
MCHC: 33.1 g/dL (ref 30.0–36.0)
MCV: 81.6 fL (ref 80.0–100.0)
Monocytes Absolute: 1 10*3/uL (ref 0.1–1.0)
Monocytes Relative: 11 %
Neutro Abs: 6.2 10*3/uL (ref 1.7–7.7)
Neutrophils Relative %: 66 %
Platelets: 418 10*3/uL — ABNORMAL HIGH (ref 150–400)
RBC: 3.15 MIL/uL — ABNORMAL LOW (ref 3.87–5.11)
RDW: 15.9 % — ABNORMAL HIGH (ref 11.5–15.5)
WBC: 9.3 10*3/uL (ref 4.0–10.5)
nRBC: 0 % (ref 0.0–0.2)

## 2019-04-17 MED ORDER — ADULT MULTIVITAMIN W/MINERALS CH
1.0000 | ORAL_TABLET | Freq: Every day | ORAL | Status: DC
  Administered 2019-04-17 – 2019-04-19 (×3): 1 via ORAL
  Filled 2019-04-17 (×3): qty 1

## 2019-04-17 MED ORDER — ENSURE ENLIVE PO LIQD
237.0000 mL | Freq: Three times a day (TID) | ORAL | Status: DC
  Administered 2019-04-17 – 2019-04-19 (×7): 237 mL via ORAL

## 2019-04-17 NOTE — Progress Notes (Addendum)
      [address]       [place] [address]             [phone]           Subjective: Patient wants to know when she will start chemotherapy treatment  Objective: Vital signs in last 24 hours: Temp:  [98.3 F (36.8 C)-98.9 F (37.2 C)] 98.9 F (37.2 C) (10/07 0815) Pulse Rate:  [97-105] 97 (10/07 0815) Cardiac Rhythm: Normal sinus rhythm (10/07 0711) Resp:  [19-20] 20 (10/07 0815) BP: (108-120)/(76-84) 108/76 (10/07 0815) SpO2:  [96 %-99 %] 96 % (10/07 0815) Weight:  [61.5 kg] 61.5 kg (10/07 0500)      Intake/Output from previous day: 10/06 0701 - 10/07 0700 In: 795 [P.O.:480; Blood:315] Out: 1250 [Urine:450; Chest Tube:800]   Physical Exam:  Cardiovascular: RRR Pulmonary: Slightly diminished right base    Lab Results: CBC: Recent Labs    04/15/19 1227  04/16/19 0943 04/17/19 0408  WBC 12.4*  --   --  9.3  HGB 7.0*   < > 8.7* 8.5*  HCT 22.2*   < > 25.6* 25.7*  PLT 594*  --   --  418*   < > = values in this interval not displayed.   BMET:  Recent Labs    04/16/19 0302 04/17/19 0408  NA 140 140  K 4.1 3.6  CL 113* 114*  CO2 21* 20*  GLUCOSE 99 89  BUN 5* 6*  CREATININE 0.82 0.77  CALCIUM 7.5* 7.8*    PT/INR: No results for input(s): LABPROT, INR in the last 72 hours. ABG:  INR: Will add last result for INR, ABG once components are confirmed Will add last 4 CBG results once components are confirmed  Assessment/Plan:  1. CV - SR 2.  Pulmonary - Had right Pleur X catheter placed 09/21 for recurrent, malignant pleural effusion. On 2 liters of oxygen via [place] (and was on PTA). Right pleur X drained yesterday with 800 cc removed. Will likely continue QOD drainage at discharge. 3. Anemia-H and H this am 8.5 and 25.7. She has had previous transfusion 4. Will discuss with Dr. Prescott Gum regarding follow up with him (likely a few weeks) as well as oncology follow up as patient with advanced stage adenocarcinoma of lung  Donielle  M ZimmermanPA-C 04/17/2019,11:21 AM [phone]  Last chest x-ray shows significant right pleural effusion.  Patient will need Pleurx drainage catheter daily while in hospital.  When she is discharged home health nursing Pleurx drainage schedule be Monday Wednesday Friday patient examined and medical record reviewed,agree with above note. Tharon Aquas Trigt III 04/17/2019

## 2019-04-17 NOTE — Evaluation (Signed)
Occupational Therapy Evaluation Patient Details Name: Kristen Chavez MRN: [id number] DOB: April 22, 1951 Today's Date: 04/17/2019    History of Present Illness Kristen Chavez is a 68 year old female with recent diagnosis of adenocarcinoma of the lung with malignant pleural effusion requiring pleural drain presents to ED with dizziness and weakness with a Hgb of 7.0 and soft BP 70/50. PMHx: OA, HTN.   Clinical Impression   Pt PTA: Living alone and was independent about 2 months ago and requiring gradual assist recently. Pt currently limited by poor activity tolerance and education on energy conservation techniques. Pt ambulating 120' with RW minguardA and requiring seated rest break at 60'. O2 sats 86% on RA and requiring 2L O2 to recover >90% O2. Pt supervisionA overall for ADL with energy conservation techniques. Pt would benefit from continued OT skilled services for ADL, mobility and safety in [place] setting. OT following acutely for E conservation.    Follow Up Recommendations  Home health OT    Equipment Recommendations  None recommended by OT    Recommendations for Other Services       Precautions / Restrictions Precautions Precautions: Fall Precaution Comments: watch sats Restrictions Weight Bearing Restrictions: No      Mobility Bed Mobility Overal bed mobility: Modified Independent             General bed mobility comments: use of bed features and elevated HOB  Transfers Overall transfer level: Modified independent Equipment used: Rolling walker (2 wheeled);1 person hand held assist Transfers: Sit to/from Stand Sit to Stand: Supervision         General transfer comment: transfers at side of bed with cues for hand placement    Balance Overall balance assessment: Needs assistance Sitting-balance support: Feet supported Sitting balance-Leahy Scale: Good     Standing balance support: Bilateral upper extremity supported;During functional activity Standing  balance-Leahy Scale: Fair Standing balance comment: RW needed for dynamic standing balance                           ADL either performed or assessed with clinical judgement   ADL Overall ADL's : Needs assistance/impaired Eating/Feeding: Independent   Grooming: Wash/dry hands;Wash/dry face;Min guard;Sitting;Standing   Upper Body Bathing: Set up;Sitting   Lower Body Bathing: Min guard;Minimal assistance;Sitting/lateral leans;Sit to/from stand   Upper Body Dressing : Set up;Sitting   Lower Body Dressing: Min guard;Minimal assistance;Sitting/lateral leans;Sit to/from stand   Toilet Transfer: Min guard;RW;Ambulation   Toileting- Water quality scientist and Hygiene: Min guard;Sitting/lateral lean;Sit to/from stand;Cueing for safety       Functional mobility during ADLs: Min guard;Rolling walker General ADL Comments: Pt wanting to take a shower- OTR let RN know. Pt grooming at sink and having to use bathroom with no physical assist required. Pt limited by poor activity tolerance and education on energy conservation techniques.     Vision Baseline Vision/History: Wears glasses Wears Glasses: At all times Vision Assessment?: No apparent visual deficits     Perception     Praxis      Pertinent Vitals/Pain Pain Assessment: No/denies pain     Hand Dominance Right   Extremity/Trunk Assessment Upper Extremity Assessment Upper Extremity Assessment: Generalized weakness   Lower Extremity Assessment Lower Extremity Assessment: Generalized weakness   Cervical / Trunk Assessment Cervical / Trunk Assessment: Normal   Communication Communication Communication: No difficulties   Cognition Arousal/Alertness: Awake/alert Behavior During Therapy: WFL for tasks assessed/performed;Agitated Overall Cognitive Status: Within Functional Limits for tasks assessed  General Comments: tearful and frustrated during session about CLOF    General Comments  Pt ambulating 120' with RW. Pt was snappy during session and then apologized and started crying about her current situation.    Exercises     Shoulder Instructions      Home Living Family/patient expects to be discharged to:: Private residence Living Arrangements: Alone Available Help at Discharge: Family;Available 24 hours/day Type of Home: House Home Access: Stairs to enter [employer] of Steps: 2   Home Layout: One level     Bathroom Shower/Tub: Teacher, early years/pre: Standard     Home Equipment: None          Prior Functioning/Environment Level of Independence: Independent        Comments: was able to do all house work and shopping until last 2 months        OT Problem List: Decreased activity tolerance;Decreased safety awareness;Impaired balance (sitting and/or standing)      OT Treatment/Interventions: Self-care/ADL training;Therapeutic exercise;Energy conservation;DME and/or AE instruction;Therapeutic activities    OT Goals(Current goals can be found in the care plan section) Acute Rehab OT Goals Patient Stated Goal: go home OT Goal Formulation: With patient Time For Goal Achievement: 05/01/19 Potential to Achieve Goals: Good ADL Goals Pt Will Perform Lower Body Dressing: (P) with modified independence;sit to/from stand Pt/caregiver will Perform Home Exercise Program: (P) Increased strength;With written HEP provided Additional ADL Goal #1: (P) Pt will demonstrate use of 3 energy conservation techniques during ADL routine with modified indepedence in order to maximize independence and safety with ADLs.  OT Frequency: Min 2X/week   Barriers to D/C:            Co-evaluation              AM-PAC OT "6 Clicks" Daily Activity     Outcome Measure Help from another person eating meals?: None Help from another person taking care of personal grooming?: A Little Help from another person toileting, which  includes using toliet, bedpan, or urinal?: A Little Help from another person bathing (including washing, rinsing, drying)?: A Little Help from another person to put on and taking off regular upper body clothing?: A Little Help from another person to put on and taking off regular lower body clothing?: A Little 6 Click Score: 19   End of Session Equipment Utilized During Treatment: Rolling walker Nurse Communication: Mobility status;Other (comment)  Activity Tolerance: Patient limited by fatigue Patient left: in bed;with call bell/phone within reach  OT Visit Diagnosis: Unsteadiness on feet (R26.81);Muscle weakness (generalized) (M62.81)                Time: 1001-1026 OT Time Calculation (min): 25 min Charges:  OT General Charges $OT Visit: 1 Visit OT Evaluation $OT Eval Moderate Complexity: 1 Mod OT Treatments $Self Care/Home Management : 8-22 mins  Ebony Hail Harold Hedge) Marsa Aris OTR/L Acute Rehabilitation Services Pager: [phone] Office: [facility] 04/17/2019, 4:29 PM

## 2019-04-17 NOTE — Progress Notes (Signed)
[place] on an doff to use bedside commode, no complaints or distress, respiratory status appears less stressed and labored.,oxygen saturation 99%.assisted as needed.

## 2019-04-17 NOTE — Plan of Care (Signed)
  Problem: Clinical Measurements: Goal: Will remain free from infection Outcome: Progressing   Problem: Clinical Measurements: Goal: Respiratory complications will improve Outcome: Progressing   

## 2019-04-17 NOTE — Progress Notes (Signed)
Subjective:   Kristen Chavez notes that she is doing okay today.  As we began talking, she was just finishing up working with PT, at which time she ambulated up and down the halls without needing additional oxygen support.  At this time, she denies dizziness, headache, chest pain, worsening in shortness of breath.  Yesterday she was able to eat a little less than half of her sandwich but notes that he said okay.  Kristen Chavez notes that she has been having difficulty coping with the diagnosis of cancer in the past month.  She states she feels she is just going on to die.  She is very anxious about not be able to see her oncologist at her appointment as previously scheduled and really hopes Dr. Darcey Nora will be able to visit her while she is inpatient.  She is not currently seeing any counselor but is interested in doing so.  She is also interested in home health at this time.  Nurse informed us that Kristen Chavez also noted interest in palliative consult.  She did not explicitly state the status.  Objective:  Vital signs in last 24 hours: Vitals:   04/16/19 2313 04/17/19 0200 04/17/19 0326 04/17/19 0500  BP: 109/77 110/76 120/82   Pulse:      Resp:      Temp: 98.4 F (36.9 C)  98.4 F (36.9 C)   TempSrc: Oral  Oral   SpO2:      Weight:    61.5 kg  Height:       Physical Exam Constitutional:      Appearance: She is normal weight.  Pulmonary:     Effort: Pulmonary effort is normal. No respiratory distress.  Skin:    General: Skin is warm and dry.  Neurological:     General: No focal deficit present.     Mental Status: She is alert and oriented to person, place, and time. Mental status is at baseline.  Psychiatric:        Attention and Perception: She is inattentive.        Mood and Affect: Affect is tearful (appropriately).        Behavior: Behavior normal. Behavior is cooperative.    Assessment/Plan:  Active Problems:   Adenocarcinoma (HCC)   Pleural effusion, malignant  Symptomatic anemia  # Symptomatic anemia: Likely secondary to blood loss from Pleurex. S/p two transfusion yesterday. Hemoglobin has stabilized at 8.0. No additional transfusions required at this time.  - CBC daily while admitted - Transfuse if hemoglobin is <7.0  # Pleural Effusion, Malignant: 800cc output in the past 24 hours. Patient stated that TCTS will see her here. They have started their note, will follow up their recommendations.  - Care order placed to nursing to drain pleural fluid with vacuum container.  - Patient able to assist in this process  # Adenocarcinoma: Oncology appointment was originally for today but had to be cancelled due to admission. Kristen Chavez is very anxious to meet with the oncologists and discuss her care options. Will call their office and see if they can see her inpatient, if not will consider formal heme/onc consultation if Dr. Irene Limbo cannot see her in the office ASAP.   # Hypotension: BP has improved significantly in the past 24 hours with multiple boluses and increased PO intake. Discussed with patient the importance of maintaining diet to keep BP up. Will start Mirtazapine for anorexia.   - Repeat LR bolus PRN - Regular diet - BMP in  am - Mirtazapine   # Depression and Anxiety Exacerbated by recent diagnosis of lung cancer. She is open to starting counseling and I have senta message to [facility] in our clinic to set up a telehealth visit for today or tomorrow.   - Anxiety: Hydroxyzine  - Depression: Bupropion and Mirtazapine    Dispo: Anticipated discharge in 0-1 days.   Dr. Jose Persia Internal Medicine PGY-1  Pager: [phone] 04/17/2019, 6:30 AM

## 2019-04-17 NOTE — Progress Notes (Signed)
Initial Nutrition Assessment  INTERVENTION:   -Ensure Enlive po TID, each supplement provides 350 kcal and 20 grams of protein -Multivitamin with minerals daily  NUTRITION DIAGNOSIS:   Increased nutrient needs related to cancer and cancer related treatments as evidenced by estimated needs.  GOAL:   Patient will meet greater than or equal to 90% of their needs  MONITOR:   PO intake, Supplement acceptance, Labs, Weight trends, I & O's  REASON FOR ASSESSMENT:   Malnutrition Screening Tool  Poor appetite, diagnosis  ASSESSMENT:   68 year old community dwelling woman with recent diagnosis of adenocarcinoma of the lung with malignant pleural effusion requiring pleural drain who presented to the [place] and wellness clinic for a hospital follow-up and complained of dizziness and weakness.  Other symptoms included anorexia, constipation, dyspnea, dizziness.  Recent admission: 9/16-9/24  **RD working remotely**  Patient continues to have poor appetite, eating 25-50% of meals. Pt was ordered Ensure supplements last admission, will continue these this admission.   Per weight records, pt has maintained weight since last admission. Last recorded weight was 143 lbs in June 2019.  I/Os: -1.1L since admit UOP 10/6: 450 ml  Labs reviewed. Medications: Remeron tablet daily, Multivitamin with minerals daily  NUTRITION - FOCUSED PHYSICAL EXAM:  Unable to perform -working remotely.  Diet Order:   Diet Order            Diet regular Room service appropriate? Yes; Fluid consistency: Thin  Diet effective now              EDUCATION NEEDS:   No education needs have been identified at this time  Skin:     Last BM:  10/2  Height:   Ht Readings from Last 1 Encounters:  04/15/19 5\' 3"  (1.6 m)    Weight:   Wt Readings from Last 1 Encounters:  04/17/19 61.5 kg    Ideal Body Weight:  52.3 kg  BMI:  Body mass index is 24.02 kg/m.  Estimated Nutritional Needs:   Kcal:   1900-2100  Protein:  95-105g  Fluid:  1.9L/day  Clayton Bibles, MS, RD, LDN Inpatient Clinical Dietitian Pager: [phone] After Hours Pager: [phone]

## 2019-04-17 NOTE — Progress Notes (Signed)
  Date: 04/17/2019  Patient name: Kristen Chavez  Medical record number: [id number]  Date of birth: October 31, 1950        I have seen and evaluated this patient and I have discussed the plan of care with the house staff. Please see their note for complete details. I concur with their findings with the following additions/corrections: Ms. Markovic was seen this morning on team rounds.  She had just completed her PT session and had returned to bed.  She was able to walk in the hallway 100 feet and although she became tachycardic, she did not desaturate.  She stated she felt a little weak and tired afterwards but not severely so.  Although she declined outpatient PT after her last discharge, she is now in agreement to receive home health services.  Her vital signs are stable as is her hemoglobin.  Medically, she does not require inpatient stay.  However, she is having difficulty processing her new diagnosis and feels like she would be going home "to die".  She is on Celexa 40 mg but is also having anorexia.  We have stopped Celexa and started Remeron for its appetite stimulant effects and are working with our pharmacy colleagues to ensure she will get the same antidepressant effect on Remeron as she does Celexa.  We are also taking over as her PCP and will link her up with [facility] for virtual or in person behavioral health as she is open to the idea of counseling.  She has seen oncology during her last hospitalization but a lot of it was planning and not recommendations.  Her outpatient oncology and outpatient CVTS appointments were scheduled today but she was unable to attend as hospitalized.  We will work with these offices to determine whether they see her as an inpatient or as an outpatient.  Bartholomew Crews, MD 04/17/2019, 1:25 PM

## 2019-04-17 NOTE — Progress Notes (Signed)
Physical Therapy Evaluation Patient Details Name: Kristen Chavez MRN: [id number] DOB: 02-22-1951 Today's Date: 04/17/2019   History of Present Illness  Ms. Daza is a 68 year old female with recent diagnosis of adenocarcinoma of the lung with malignant pleural effusion requiring pleural drain presents to ED with dizziness and weakness with a Hgb of 7.0 and soft BP 70/50. PMHx: OA, HTN.  Clinical Impression  Pt was seen for mobility with RW and with control of balance in standing.  Pt is maintaining O2 sats with all gait but HR is elevated to 122 during session.  Remained up and then her hospital team came by and removed telemetry.  Follow up with her on gait and balance to increase strength and endurance to make a transfer home more manageable.  Continues to have poor intake so will need to monitor her for change in status that would change her discharge plan to a higher care level.    Follow Up Recommendations Home health PT;Supervision for mobility/OOB    Equipment Recommendations  Rolling walker with 5" wheels    Recommendations for Other Services OT consult     Precautions / Restrictions Precautions Precautions: Fall Precaution Comments: watch sats Restrictions Weight Bearing Restrictions: No      Mobility  Bed Mobility Overal bed mobility: Modified Independent             General bed mobility comments: use of bed features and elevated HOB  Transfers Overall transfer level: Modified independent Equipment used: Rolling walker (2 wheeled);1 person hand held assist Transfers: Sit to/from Stand Sit to Stand: Supervision         General transfer comment: transfers at side of bed with cues for hand placement  Ambulation/Gait Ambulation/Gait assistance: Min guard Gait Distance (Feet): 100 Feet Assistive device: Rolling walker (2 wheeled);1 person hand held assist Gait Pattern/deviations: Step-through pattern;Decreased stride length;Wide base of support Gait  velocity: reduced Gait velocity interpretation: <1.31 ft/sec, indicative of household ambulator General Gait Details: HR was 122 nearly the entire session and O2 sats on 3L were always above 90%  Stairs            Wheelchair Mobility    Modified Rankin (Stroke Patients Only)       Balance Overall balance assessment: Needs assistance Sitting-balance support: Feet supported Sitting balance-Leahy Scale: Good     Standing balance support: Bilateral upper extremity supported;During functional activity Standing balance-Leahy Scale: Fair Standing balance comment: RW needed for dynamic standing balance                             Pertinent Vitals/Pain Pain Assessment: No/denies pain    Home Living Family/patient expects to be discharged to:: Private residence Living Arrangements: Alone Available Help at Discharge: Family;Available 24 hours/day Type of Home: House Home Access: Stairs to enter   [employer] of Steps: 2 Home Layout: One level Home Equipment: None      Prior Function Level of Independence: Independent         Comments: was able to do all house work and shopping until last 2 months     Hand Dominance   Dominant Hand: Right    Extremity/Trunk Assessment   Upper Extremity Assessment Upper Extremity Assessment: Generalized weakness    Lower Extremity Assessment Lower Extremity Assessment: Generalized weakness    Cervical / Trunk Assessment Cervical / Trunk Assessment: Normal  Communication   Communication: No difficulties  Cognition Arousal/Alertness: Awake/alert Behavior During Therapy: [facility]  for tasks assessed/performed;Agitated Overall Cognitive Status: Within Functional Limits for tasks assessed                                 General Comments: became tearful for a very short moment      General Comments General comments (skin integrity, edema, etc.): pt is up to side of bed, motivated and yet sad  about her diagnosis and situation.  Was seen until med docs came by and she was taken off telemetry when they did    Exercises     Assessment/Plan    PT Assessment Patient needs continued PT services  PT Problem List Decreased strength;Decreased range of motion;Decreased activity tolerance;Decreased balance;Decreased mobility;Decreased coordination;Decreased knowledge of use of DME;Cardiopulmonary status limiting activity       PT Treatment Interventions Gait training;Therapeutic exercise;Patient/family education;Stair training;Balance training;Functional mobility training;DME instruction;Therapeutic activities;Neuromuscular re-education    PT Goals (Current goals can be found in the Care Plan section)  Acute Rehab PT Goals Patient Stated Goal: go home    Frequency Min 3X/week   Barriers to discharge Inaccessible home environment;Decreased caregiver support home alone with 2 stairs to enter    Co-evaluation               AM-PAC PT "6 Clicks" Mobility  Outcome Measure Help needed turning from your back to your side while in a flat bed without using bedrails?: None Help needed moving from lying on your back to sitting on the side of a flat bed without using bedrails?: None Help needed moving to and from a bed to a chair (including a wheelchair)?: A Little Help needed standing up from a chair using your arms (e.g., wheelchair or bedside chair)?: A Little Help needed to walk in hospital room?: A Little Help needed climbing 3-5 steps with a railing? : A Little 6 Click Score: 20    End of Session Equipment Utilized During Treatment: Gait belt;Oxygen Activity Tolerance: Treatment limited secondary to medical complications (Comment)(HR up to 122) Patient left: in bed;with call bell/phone within reach;with bed alarm set;with nursing/sitter in room Nurse Communication: Mobility status PT Visit Diagnosis: Unsteadiness on feet (R26.81);Muscle weakness (generalized) (M62.81)     Time: 6237-6283 PT Time Calculation (min) (ACUTE ONLY): 31 min   Charges:   PT Evaluation $PT Eval Moderate Complexity: 1 Mod PT Treatments $Gait Training: 8-22 mins       Ramond Dial 04/17/2019, 1:19 PM    Mee Hives, PT MS Acute Rehab Dept. Number: [place] and [place]

## 2019-04-18 ENCOUNTER — Encounter: Payer: Self-pay | Admitting: Licensed Clinical Social Worker

## 2019-04-18 ENCOUNTER — Ambulatory Visit (INDEPENDENT_AMBULATORY_CARE_PROVIDER_SITE_OTHER): Payer: Self-pay | Admitting: Licensed Clinical Social Worker

## 2019-04-18 ENCOUNTER — Other Ambulatory Visit: Payer: Self-pay | Admitting: *Deleted

## 2019-04-18 DIAGNOSIS — C3490 Malignant neoplasm of unspecified part of unspecified bronchus or lung: Secondary | ICD-10-CM

## 2019-04-18 DIAGNOSIS — F331 Major depressive disorder, recurrent, moderate: Secondary | ICD-10-CM

## 2019-04-18 DIAGNOSIS — R0989 Other specified symptoms and signs involving the circulatory and respiratory systems: Secondary | ICD-10-CM

## 2019-04-18 DIAGNOSIS — Z66 Do not resuscitate: Secondary | ICD-10-CM

## 2019-04-18 DIAGNOSIS — C3491 Malignant neoplasm of unspecified part of right bronchus or lung: Secondary | ICD-10-CM

## 2019-04-18 DIAGNOSIS — Z7189 Other specified counseling: Secondary | ICD-10-CM

## 2019-04-18 DIAGNOSIS — C801 Malignant (primary) neoplasm, unspecified: Secondary | ICD-10-CM

## 2019-04-18 DIAGNOSIS — Z515 Encounter for palliative care: Secondary | ICD-10-CM

## 2019-04-18 DIAGNOSIS — J910 Malignant pleural effusion: Secondary | ICD-10-CM

## 2019-04-18 DIAGNOSIS — D649 Anemia, unspecified: Secondary | ICD-10-CM

## 2019-04-18 LAB — BASIC METABOLIC PANEL
Anion gap: 9 (ref 5–15)
BUN: 5 mg/dL — ABNORMAL LOW (ref 8–23)
CO2: 20 mmol/L — ABNORMAL LOW (ref 22–32)
Calcium: 8 mg/dL — ABNORMAL LOW (ref 8.9–10.3)
Chloride: 111 mmol/L (ref 98–111)
Creatinine, Ser: 0.88 mg/dL (ref 0.44–1.00)
GFR calc Af Amer: >60 mL/min (ref >60)
GFR calc non Af Amer: >60 mL/min (ref >60)
Glucose, Bld: 89 mg/dL (ref 70–99)
Potassium: 3.9 mmol/L (ref 3.5–5.1)
Sodium: 140 mmol/L (ref 135–145)

## 2019-04-18 LAB — CBC WITH DIFFERENTIAL/PLATELET
Abs Immature Granulocytes: 0.45 10*3/uL — ABNORMAL HIGH (ref 0.00–0.07)
Basophils Absolute: 0.1 10*3/uL (ref 0.0–0.1)
Basophils Relative: 1 %
Eosinophils Absolute: 0.5 10*3/uL (ref 0.0–0.5)
Eosinophils Relative: 4 %
HCT: 26.3 % — ABNORMAL LOW (ref 36.0–46.0)
Hemoglobin: 8.4 g/dL — ABNORMAL LOW (ref 12.0–15.0)
Immature Granulocytes: 4 %
Lymphocytes Relative: 11 %
Lymphs Abs: 1.2 10*3/uL (ref 0.7–4.0)
MCH: 26.6 pg (ref 26.0–34.0)
MCHC: 31.9 g/dL (ref 30.0–36.0)
MCV: 83.2 fL (ref 80.0–100.0)
Monocytes Absolute: 1.3 10*3/uL — ABNORMAL HIGH (ref 0.1–1.0)
Monocytes Relative: 12 %
Neutro Abs: 7.1 10*3/uL (ref 1.7–7.7)
Neutrophils Relative %: 68 %
Platelets: 403 10*3/uL — ABNORMAL HIGH (ref 150–400)
RBC: 3.16 MIL/uL — ABNORMAL LOW (ref 3.87–5.11)
RDW: 16.2 % — ABNORMAL HIGH (ref 11.5–15.5)
WBC: 10.5 10*3/uL (ref 4.0–10.5)
nRBC: 0 % (ref 0.0–0.2)

## 2019-04-18 MED ORDER — MORPHINE SULFATE (CONCENTRATE) 10 MG/0.5ML PO SOLN
2.5000 mg | ORAL | Status: DC | PRN
  Administered 2019-04-19 (×2): 2.6 mg via ORAL
  Filled 2019-04-18 (×2): qty 0.5

## 2019-04-18 NOTE — BH Specialist Note (Signed)
Integrated Behavioral Health Visit via Telemedicine (Telephone)  04/18/2019 Kristen Chavez [id number]   Session Start time: 9:30  Session End time: 9:50 Total time: 20 minutes  Referring Provider: Dr. Charleen Kirks Type of Visit: Telephonic Patient location: [facility] [place] Provider location: office All persons participating in visit: patient and [facility]  Confirmed patient's address: Yes  Confirmed patient's phone number: Yes  Any changes to demographics: No   Discussed confidentiality: Yes    The following statements were read to the patient and/or legal guardian that are established with the [facility] Provider.  "The purpose of this phone visit is to provide behavioral health care while limiting exposure to the coronavirus (COVID19).  There is a possibility of technology failure and discussed alternative modes of communication if that failure occurs."  "By engaging in this telephone visit, you consent to the provision of healthcare.  Additionally, you authorize for your insurance to be billed for the services provided during this telephone visit."   Patient and/or legal guardian consented to telephone visit: Yes   PRESENTING CONCERNS: Patient and/or family reports the following symptoms/concerns: anxiety, depression, and health issues. Duration of problem: increased recently due to health challenges; Severity of problem: moderate  GOALS ADDRESSED: Patient will: 1.  Reduce symptoms of: anxiety, depression and stress  2.  Increase knowledge and/or ability of: coping skills, healthy habits and stress reduction  3.  Demonstrate ability to: Increase healthy adjustment to current life circumstances, Increase adequate support systems for patient/family and Begin healthy grieving over loss  INTERVENTIONS: Interventions utilized:  Supportive Counseling Standardized Assessments completed: assessed for SI, HI, and self-harm.  ASSESSMENT: Patient currently experiencing severe levels of  anxiety due to learning of her current health status. Patient was very tearful on the phone, and is having difficulty adjusting to her declining health condition. Patient reported she can no longer complete many daily living needs by herself. Patient reported she has natural supports, but she wants to improve her relationship with her daughter before her health declines too far. Patient is not hopeful that her health will improve. Patient struggled to see the positive, but she is hopeful that she can gain a clear understanding of her options after talking to her doctor later today.   Patient may benefit from counseling.  PLAN: 1. Follow up with behavioral health clinician on : one week.  Dessie Coma, [facility], [place]

## 2019-04-18 NOTE — Care Management Important Message (Signed)
Important Message  Patient Details  Name: Kristen Chavez MRN: [id number] Date of Birth: 09-27-1950   Medicare Important Message Given:  Yes     Miku Udall 04/18/2019, 3:00 PM

## 2019-04-18 NOTE — Progress Notes (Signed)
Subjective:   Kristen Chavez endorses chest congestion today that began yesterday. It causes significant SOB, especially with movement. She notes this has happened before, and usually clearing the pleural fluid helps. She cannot remember if the Duoneb last night helped but would be willing to try again.   Kristen Chavez are states she is "ready for everything to be over." When asked to elaborate on what is everything, she states her cancer treatment. She would like for it to be resolved already. She feels her mood is worsening daily and feels fearful all day to do anything for herself such as going to the bathroom or brushing her teeth.   Objective:  Vital signs in last 24 hours: Vitals:   04/17/19 1618 04/17/19 2119 04/17/19 2306 04/18/19 0745  BP: 120/85  (!) 148/87 109/84  Pulse: (!) 108  (!) 109 93  Resp:   20 16  Temp: 98.9 F (37.2 C)  98.5 F (36.9 C) 98.6 F (37 C)  TempSrc: Oral   Oral  SpO2: 100% 93% 100% 100%  Weight:      Height:       Physical Exam Constitutional:      Appearance: She is normal weight.  Pulmonary:     Effort: Pulmonary effort is normal. No respiratory distress.  Skin:    General: Skin is warm and dry.  Neurological:     General: No focal deficit present.     Mental Status: She is alert and oriented to person, place, and time. Mental status is at baseline.  Psychiatric:        Mood and Affect: Affect is tearful (appropriately).        Behavior: Behavior normal. Behavior is cooperative.   Assessment/Plan:  Active Problems:   Adenocarcinoma (HCC)   Pleural effusion, malignant   Symptomatic anemia  # Pleural Effusion, Malignant: 800cc output in the past 24 hours. CVTS have followed up with patient and recommend daily drainage of pleural fluid.   Patient reports congestion, will attempt to drain pleurex first. If no improvement, will try Duonebs and then perhaps Morphine for air hunger. Could be secondary to numerous boluses received on day of  admission. Consider chest xray if no resolution.   - Care order for nursing to drain pleural fluid with vacuum container daily - CVTS on board and we appreciate their recommendations  # Adenocarcinoma: Oncology appointment was originally for today but had to be cancelled due to admission. Kristen Chavez is very anxious to meet with the oncologists and discuss her care options. Dr. Irene Chavez stated yesterday he will come see the patient today. We will follow up their recommendations.   - Oncology consult pending - Palliative consult pending   # Symptomatic anemia: Likely secondary to blood loss from Pleurex. S/p two transfusion on day of admission but has stabilized since in the mid 8.0s. No additional transfusions required at this time. Will continue to monitor while she is admitted.  - CBC daily while admitted - Transfuse if hemoglobin is <7.0  # Hypotension: Significantly improved and maintains in the 120s. She did receive multiple boluses on day of admission but not since. Continue to hold all prior BP medications that include HCTZ and Lisinopril. Continue Mirtazapine for anorexia.   - Regular diet - BMP daily  - Mirtazapine   # Depression and Anxiety Exacerbated by recent diagnosis of lung cancer. Counsel session with Kristen Chavez this AM that went well. They will follow up again in 1 week. Patient continues to be  teary and fearful of doing ADLs, reports her mood worsens by the day. Endorses a good support system though.    - Anxiety: Hydroxyzine  - Depression: Bupropion and Mirtazapine    Dispo: Anticipated discharge in 0-1 days.   Dr. Jose Chavez Internal Medicine PGY-1  Pager: [phone] 04/18/2019, 1:08 PM

## 2019-04-18 NOTE — Consult Note (Signed)
Consultation Note Date: 04/18/2019   Patient Name: Kristen Chavez  DOB: 05/01/1951  MRN: [id number]  Age / Sex: 68 y.o., female   PCP: [name], MD Referring Physician: Bartholomew Crews, MD   REASON FOR CONSULTATION:Establishing goals of care  Palliative Care consult requested for this 68 y.o. female with multiple medical problems including  adenocarcinoma of the lung, malignant pleural effusion, tobacco use disorder, hypertension, and OA. She presented to ED with complaints of dizziness and hypotension. Patient recently discharged but complains of progressive weakness and decreased appetite since admission. She as a pleurx catheter in place and continues to have large amounts of serosanguinous drainage. [name] was scheduled to have a follow up with Dr. Minda Ditto (Heme/Onc) on October 7th . Since admission she continues to have a decreased appetite, with some anxiety and emotional distress as she copes and process her new cancer diagnosis and await input from Oncology. Palliative Medicine team consulted for goals of care.   Clinical Assessment and Goals of Care: I have reviewed medical records including lab results, imaging, Epic notes, and MAR, received report from the bedside RN, and assessed the patient. I met at the bedside with patient to discuss diagnosis prognosis, GOC, EOL wishes, disposition and options. She is awake, alert, and oriented x4. Denies pain but complains of some shortness of breath at times.   I re-introduced Palliative Medicine as specialized medical care for people living with serious illness. It focuses on providing relief from the symptoms and stress of a serious illness. The goal is to improve quality of life for both the patient and the family. Patient verbalized appreciation of our support and involvement. She is known to our team. My colleague Stanton Kidney, NP was involved in her care on previous admission. Patient verbalized remembrance and expresses her  appreciation of continued support.   [name] reports she currently lives alone however, her daughter stays 4 houses down from her. She is tearful in sharing she does not like not knowing what is going on with her cancer, what options if any are available, and most importantly not being able to care for herself and remain independent as she has always done. She states she is fearful of doing to much because when she gets so short of breath she fears she is dying because of the tightness and anxiety.   She shares her daughter is her biggest support however, she is guilty that she is sick as her daughter is in school, works, and has 3 children 37,34, and 10 years old).   She states she has loss almost 35lb now due to poor appetite and continued feeling of fullness. She also is tearful stating "I used to love to eat, now it takes so much energy and breath to even enjoy it!" She states the pleurx catheter has helped and she tolerates draining when required but she remains fearful.   She states she will feel much better once she is able to meet with Oncology and have a better understanding with what she is dealing with. She is hopeful for the options of chemotherapy and radiation however, she is more fearful she will have no options other than "dying". She does express she does not want to be a burden to family or to suffer. Support and therapeutic listening provided.    I attempted to elicit values and goals of care important to the patient.    The difference between aggressive medical intervention and comfort care was  considered in light of the patient's goals of care. Again [name] wishes to speak with Oncology and hear all of her available options with anticipation of undergoing treatment if offered. She is tearful stating "I just want it all to be over" Space and opportunity allowed for her to express what she means by this. She states she wants the anxiety and the fear of the unknown to be over and  wishes to know what her future holds. She is tearful stating she is fearful and just wants peace within. Support provided.   Hospice and Palliative Care services outpatient were explained and offered. Patient verbalized her understanding and awareness of both palliative and hospice's goals and philosophy of care. At this time she wishes to follow up with Palliative at the cancer center if she is going to have the option to undergo chemo and/or radiation. She is aware that Stanton Kidney, NP does some outpatient follow at the [facility] and is requesting to follow up with her in the near future. She is not ready to accept a more hospice approach at this time.   She confirms wishes for DNR/DNI. She states she does not have an advanced directive but have been in discussion with her daughter to hopefully complete at discharge. She states "no matter what I need to get my affairs in order!" I offered her the opportunity to have Spiritual support and assistance with completion of advanced directives while hospitalized. She verbalized appreciation and expressed she would appreciate this service.   Questions and concerns were addressed.Patient was encouraged to call with questions or concerns.  PMT will continue to support holistically.   SOCIAL HISTORY:     reports that she quit smoking about 5 years ago. Her smoking use included cigarettes. She smoked 0.25 packs per day. She has never used smokeless tobacco. She reports previous alcohol use of about 21.0 standard drinks of alcohol per week. She reports previous drug use. Drug: Marijuana.  CODE STATUS: DNR  ADVANCE DIRECTIVES: Daughter, Kristen Chavez    SYMPTOM MANAGEMENT: per attending   Palliative Prophylaxis:   Frequent Pain Assessment  PSYCHO-SOCIAL/SPIRITUAL:  Support System: family  Desire for further Chaplaincy support: Yes   Additional Recommendations (Limitations, Scope, Preferences):  Full Scope Treatment   PAST MEDICAL HISTORY: Past  Medical History:  Diagnosis Date  . Anxiety   . Arthritis    osteoarthritis.  . Cancer ([place])    lung  . Depression   . Hypertension     PAST SURGICAL HISTORY:  Past Surgical History:  Procedure Laterality Date  . CHEST TUBE INSERTION Right 04/01/2019   Procedure: INSERTION PLEURAL DRAINAGE CATHETER;  Surgeon: [name], MD;  Location: [place];  Service: Thoracic;  Laterality: Right;  . TONSILLECTOMY      ALLERGIES:  has No Known Allergies.   MEDICATIONS:  Current Facility-Administered Medications  Medication Dose Route Frequency Provider Last Rate Last Dose  . 0.9 %  sodium chloride infusion   Intravenous Continuous Harvie Heck, MD 100 mL/hr at 04/17/19 2040    . acetaminophen (TYLENOL) tablet 650 mg  650 mg Oral Q6H PRN Kathi Ludwig, MD       Or  . acetaminophen (TYLENOL) suppository 650 mg  650 mg Rectal Q6H PRN Kathi Ludwig, MD      . albuterol (PROVENTIL) (2.5 MG/3ML) 0.083% nebulizer solution 2.5 mg  2.5 mg Nebulization Q6H PRN Bartholomew Crews, MD   2.5 mg at 04/17/19 2118  . buPROPion (WELLBUTRIN XL) 24 hr tablet 300  mg  300 mg Oral Daily Kathi Ludwig, MD   300 mg at 04/18/19 1030  . feeding supplement (ENSURE ENLIVE) (ENSURE ENLIVE) liquid 237 mL  237 mL Oral TID BM Bartholomew Crews, MD   237 mL at 04/18/19 2000  . hydrOXYzine (ATARAX/VISTARIL) tablet 25 mg  25 mg Oral TID PRN Kathi Ludwig, MD   25 mg at 04/15/19 2351  . mirtazapine (REMERON) tablet 15 mg  15 mg Oral QHS Kathi Ludwig, MD   15 mg at 04/18/19 2102  . multivitamin with minerals tablet 1 tablet  1 tablet Oral Daily Bartholomew Crews, MD   1 tablet at 04/18/19 1030  . prednisoLONE acetate (PRED FORTE) 1 % ophthalmic suspension 1 drop  1 drop Left Eye QID Kathi Ludwig, MD   1 drop at 04/18/19 2102  . sodium chloride flush (NS) 0.9 % injection 3 mL  3 mL Intravenous Once Lucrezia Starch, MD      . traZODone (DESYREL) tablet 25-50 mg  25-50 mg Oral QHS  PRN Kathi Ludwig, MD        VITAL SIGNS: BP (!) 123/92 (BP Location: Left Arm)   Pulse (!) 107   Temp 99.6 F (37.6 C) (Oral)   Resp 16   Ht '5\' 3"'$  (1.6 m)   Wt 61.5 kg   SpO2 100%   BMI 24.02 kg/m  Filed Weights   04/15/19 1853 04/17/19 0500  Weight: 60.4 kg 61.5 kg    Estimated body mass index is 24.02 kg/m as calculated from the following:   Height as of this encounter: '5\' 3"'$  (1.6 m).   Weight as of this encounter: 61.5 kg.  LABS: CBC:    Component Value Date/Time   WBC 10.5 04/18/2019 0405   HGB 8.4 (L) 04/18/2019 0405   HCT 26.3 (L) 04/18/2019 0405   PLT 403 (H) 04/18/2019 0405   Comprehensive Metabolic Panel:    Component Value Date/Time   NA 140 04/18/2019 0405   NA 140 12/11/2017 1211   K 3.9 04/18/2019 0405   CO2 20 (L) 04/18/2019 0405   BUN 5 (L) 04/18/2019 0405   BUN 12 12/11/2017 1211   CREATININE 0.88 04/18/2019 0405   CREATININE 0.92 03/24/2016 1137   ALBUMIN 1.9 (L) 04/17/2019 0408   ALBUMIN 4.6 02/10/2017 1202     Review of Systems  Constitutional: Positive for activity change, appetite change and fatigue.  Respiratory: Positive for cough and shortness of breath.   Neurological: Positive for weakness.  Unless otherwise noted, a complete review of systems is negative.  Physical Exam General: NAD, frail chronically-ill appearing, thin Cardiovascular: regular rate and rhythm Pulmonary: clear ant fields, pleurx catheter Abdomen: soft, nontender, + bowel sounds Extremities: no edema, no joint deformities Skin: no rashes Neurological: Weakness, A&O x4, tearful, anxious (appropriately)    Prognosis: Unable to determine (Guarded to Poor)   Discharge Planning:  To Be Determined with outpatient palliative follow up at the [facility].   Recommendations:  DNR/DNI-as confirmed  Patient remains hopeful for treatment options. Anxiously awaiting to meet with Oncology and see if she is able to undergo chemo or radiation treatments.    Would like to follow up with Wadie Lessen, NP at the cancer center if she is able to have further Oncologic interventions. I will make sure Stanton Kidney is aware to connect and follow once she is discharged.   Spiritual Care support and assistance with AD. Referral placed.   PMT will continue to support and follow. Please  call for further needs. Goals are set and clear at this time.    Palliative Performance Scale: PPS 30%              Patient expressed understanding and was in agreement with this plan.   Thank you for allowing the Palliative Medicine Team to assist in the care of this patient.  Time In: 1400 Time Out: 1505 Time Total: 65 min.   Visit consisted of counseling and education dealing with the complex and emotionally intense issues of symptom management and palliative care in the setting of serious and potentially life-threatening illness.Greater than 50%  of this time was spent counseling and coordinating care related to the above assessment and plan.  Signed by:  Alda Lea, AGPCNP-BC Palliative Medicine Team  Phone: [phone] Fax: [phone] Pager: [phone] Amion: Bjorn Pippin

## 2019-04-18 NOTE — Progress Notes (Signed)
      [address]       [place] [address]             [phone]           Subjective: Patient states more congestion, somewhat harder to breathe this am. She is emotional this am as she is dealing with a lot of issues  Objective: Vital signs in last 24 hours: Temp:  [98.5 F (36.9 C)-98.9 F (37.2 C)] 98.6 F (37 C) (10/08 0745) Pulse Rate:  [93-109] 93 (10/08 0745) Resp:  [16-20] 16 (10/08 0745) BP: (109-148)/(84-87) 109/84 (10/08 0745) SpO2:  [93 %-100 %] 100 % (10/08 0745)      Intake/Output from previous day: 10/07 0701 - 10/08 0700 In: 2030.3 [P.O.:120; I.V.:1910.3] Out: 400 [Urine:400]   Physical Exam:  Cardiovascular: RRR Pulmonary: Diminished right base    Lab Results: CBC: Recent Labs    04/17/19 0408 04/18/19 0405  WBC 9.3 10.5  HGB 8.5* 8.4*  HCT 25.7* 26.3*  PLT 418* 403*   BMET:  Recent Labs    04/17/19 0408 04/18/19 0405  NA 140 140  K 3.6 3.9  CL 114* 111  CO2 20* 20*  GLUCOSE 89 89  BUN 6* 5*  CREATININE 0.77 0.88  CALCIUM 7.8* 8.0*    PT/INR: No results for input(s): LABPROT, INR in the last 72 hours. ABG:  INR: Will add last result for INR, ABG once components are confirmed Will add last 4 CBG results once components are confirmed  Assessment/Plan:  1. CV - SR 2.  Pulmonary - Had right Pleur X catheter placed 09/21 for recurrent, malignant pleural effusion. On 2 liters of oxygen via [place] (and was on PTA). Per Dr. Prescott Gum, daily right pleur X drainage while in hospital. Will  continue QOD drainage at discharge. 3. Anemia-H and H this am 8.5 and 25.7. She has had previous transfusion 4. Patient will need follow up with Dr. Prescott Gum and oncology.  Samiel Peel M ZimmermanPA-C 04/18/2019,8:28 AM [phone]

## 2019-04-18 NOTE — Progress Notes (Addendum)
  Date: 04/18/2019  Patient name: Kristen Chavez  Medical record number: [id number]  Date of birth: 10/01/1950        I have seen and evaluated this patient and I have discussed the plan of care with the house staff. Please see their note for complete details. I concur with their findings with the following additions/corrections: Ms. Giarrusso was seen this morning on team rounds.  She is processing her new medical diagnoses, current physical limitations, and uncertain prognosis.  She will remain inpatient to speak to oncology and palliative care.  This is necessary so that she can make an informed decision about her plans at discharge.  Bartholomew Crews, MD 04/18/2019, 4:12 PM

## 2019-04-19 DIAGNOSIS — C3491 Malignant neoplasm of unspecified part of right bronchus or lung: Secondary | ICD-10-CM

## 2019-04-19 DIAGNOSIS — J910 Malignant pleural effusion: Secondary | ICD-10-CM

## 2019-04-19 DIAGNOSIS — Z7189 Other specified counseling: Secondary | ICD-10-CM

## 2019-04-19 DIAGNOSIS — F458 Other somatoform disorders: Secondary | ICD-10-CM

## 2019-04-19 DIAGNOSIS — N329 Bladder disorder, unspecified: Secondary | ICD-10-CM

## 2019-04-19 DIAGNOSIS — Z6824 Body mass index (BMI) 24.0-24.9, adult: Secondary | ICD-10-CM

## 2019-04-19 LAB — IRON AND TIBC
Iron: 14 ug/dL — ABNORMAL LOW (ref 28–170)
Saturation Ratios: 8 % — ABNORMAL LOW (ref 10.4–31.8)
TIBC: 172 ug/dL — ABNORMAL LOW (ref 250–450)
UIBC: 158 ug/dL

## 2019-04-19 LAB — CBC WITH DIFFERENTIAL/PLATELET
Abs Immature Granulocytes: 0.45 10*3/uL — ABNORMAL HIGH (ref 0.00–0.07)
Basophils Absolute: 0.1 10*3/uL (ref 0.0–0.1)
Basophils Relative: 1 %
Eosinophils Absolute: 0.4 10*3/uL (ref 0.0–0.5)
Eosinophils Relative: 4 %
HCT: 25.6 % — ABNORMAL LOW (ref 36.0–46.0)
Hemoglobin: 8.5 g/dL — ABNORMAL LOW (ref 12.0–15.0)
Immature Granulocytes: 4 %
Lymphocytes Relative: 10 %
Lymphs Abs: 1.2 10*3/uL (ref 0.7–4.0)
MCH: 26.9 pg (ref 26.0–34.0)
MCHC: 33.2 g/dL (ref 30.0–36.0)
MCV: 81 fL (ref 80.0–100.0)
Monocytes Absolute: 1.4 10*3/uL — ABNORMAL HIGH (ref 0.1–1.0)
Monocytes Relative: 11 %
Neutro Abs: 9.1 10*3/uL — ABNORMAL HIGH (ref 1.7–7.7)
Neutrophils Relative %: 70 %
Platelets: 394 10*3/uL (ref 150–400)
RBC: 3.16 MIL/uL — ABNORMAL LOW (ref 3.87–5.11)
RDW: 16.4 % — ABNORMAL HIGH (ref 11.5–15.5)
WBC: 12.6 10*3/uL — ABNORMAL HIGH (ref 4.0–10.5)
nRBC: 0 % (ref 0.0–0.2)

## 2019-04-19 LAB — FERRITIN: Ferritin: 179 ng/mL (ref 11–307)

## 2019-04-19 MED ORDER — MORPHINE SULFATE 10 MG/5ML PO SOLN: 2.5000 mg | Freq: Three times a day (TID) | ORAL | 0 refills | Status: DC | PRN

## 2019-04-19 MED ORDER — MIRTAZAPINE 15 MG PO TABS: 15.0000 mg | ORAL_TABLET | Freq: Every day | ORAL | 3 refills | Status: AC

## 2019-04-19 MED ORDER — BENZONATATE 100 MG PO CAPS: 100.0000 mg | ORAL_CAPSULE | Freq: Three times a day (TID) | ORAL | 0 refills | Status: AC | PRN

## 2019-04-19 MED ORDER — BENZONATATE 100 MG PO CAPS: 100.0000 mg | ORAL_CAPSULE | Freq: Three times a day (TID) | ORAL | Status: DC | PRN

## 2019-04-19 MED ORDER — TRAZODONE HCL 50 MG PO TABS: 25.0000 mg | ORAL_TABLET | Freq: Every evening | ORAL | 3 refills | Status: AC | PRN

## 2019-04-19 MED ORDER — SODIUM CHLORIDE 0.9 % IV SOLN
510.0000 mg | Freq: Once | INTRAVENOUS | Status: AC
  Administered 2019-04-19: 510 mg via INTRAVENOUS
  Filled 2019-04-19: qty 17

## 2019-04-19 MED FILL — traZODone HCL 50 MG TABS: 50 | 30 days supply | Qty: 30 | Fill #0

## 2019-04-19 MED FILL — BENZONATATE 100 MG CAPS: 100 | 10 days supply | Qty: 30 | Fill #0

## 2019-04-19 MED FILL — MORPHINE SULF 10 MG/5 ML SO: 10 | 7 days supply | Qty: 30 | Fill #0

## 2019-04-19 MED FILL — MIRTAZAPINE 15 MG TABLET: 15 | 30 days supply | Qty: 30 | Fill #0

## 2019-04-19 NOTE — Progress Notes (Signed)
  Date: 04/19/2019  Patient name: Kristen Chavez  Medical record number: [id number]  Date of birth: 10/05/1950        I have seen and evaluated this patient and I have discussed the plan of care with the house staff. Please see their note for complete details. I concur with their findings with the following additions/corrections: [name] was seen this morning on team rounds.  She required morphine 1 dose overnight for air hunger and anxiety and felt it helped her symptoms.  Her hemoglobin remained stable.  She has been able to see Dr. Irene Limbo with oncology and the CVTS team along with the palliative care team.  She is stable to be discharged home with outpatient follow-up with oncology, CVTS, [facility], and urology.  She is also having counseling sessions with Miquel Dunn through [facility].  We will send her out with a small quantity of oral morphine to treat air hunger.  She accepted home health services including an aide when speaking with our team so that she would not have to rely on her daughter so much that from care management attempted to set up services, she declined an aide.  DC to home with outpatient follow-up.  Bartholomew Crews, MD 04/19/2019, 2:05 PM

## 2019-04-19 NOTE — Progress Notes (Signed)
Responded to palliative care Spiritual care consult for an AD. Kristen Chavez was sitting up in her bed. I gave her a copy of the AD and she said she was not ready to fill it out. She said she was open to discuss it over with her family. I noticed some emotional distraughtness about her. She said that shes angry that she had difficulty eating and the change of her health. [name] said she is having difficulty letting go because she has been very independent throughout her life.  She received her breakfast and asked to be alone, but requested that I return later. Chaplain available as needed.   Chaplain Resident Fidel Levy  [phone]

## 2019-04-19 NOTE — Progress Notes (Signed)
Marland Kitchen   HEMATOLOGY/ONCOLOGY INPATIENT PROGRESS NOTE  Date of Service: 04/19/2019  Inpatient Attending: .Bartholomew Crews, MD   SUBJECTIVE  Ms Kierstead was to be seen in post hospitalization f/u in the cancer clinic with [place] on 10/7 but was readmitted to the hospital with increasing shortness of breath, dizziness and hypotension. She notes increasing fatigue and large volume of sero-sanguinous output form the rt pleurx catheter. I saw her in oncologic f/u today. She notes that she is still short of breath and has episodes of anxiety. Poor apetite-- recently started on Mirtazepine instead of previous Celexa. Significant issues with cough. Daughter at bedside   OBJECTIVE:  Mild distress from anxiety  PHYSICAL EXAMINATION: . Vitals:   04/18/19 0745 04/18/19 1608 04/18/19 1939 04/18/19 2325  BP: 109/84 110/90 (!) 123/92 106/76  Pulse: 93 (!) 109 (!) 107 (!) 106  Resp: 16 16    Temp: 98.6 F (37 C) 99.2 F (37.3 C) 99.6 F (37.6 C) 99.3 F (37.4 C)  TempSrc: Oral Oral Oral Oral  SpO2: 100% 100% 100% 100%  Weight:      Height:       Filed Weights   04/15/19 1853 04/17/19 0500  Weight: 133 lb 2.5 oz (60.4 kg) 135 lb 9.3 oz (61.5 kg)   .Body mass index is 24.02 kg/m.  GENERAL:alert, in no acute distress and comfortable SKIN: skin color, texture, turgor are normal, no rashes or significant lesions EYES: normal, conjunctiva are pink and non-injected, sclera clear OROPHARYNX:no exudate, no erythema and lips, buccal mucosa, and tongue normal  NECK: supple, no JVD, thyroid normal size, non-tender, without nodularity LYMPH:  no palpable lymphadenopathy in the cervical, axillary or inguinal LUNGS: decreased breath sounds on rt side HEART: regular rate & rhythm,  no murmurs and no lower extremity edema ABDOMEN: abdomen soft, non-tender, normoactive bowel sounds  Musculoskeletal: no cyanosis of digits and no clubbing  PSYCH: alert & oriented x 3 with fluent speech NEURO: no focal  motor/sensory deficits  MEDICAL HISTORY:  Past Medical History:  Diagnosis Date   Anxiety    Arthritis    osteoarthritis.   Cancer ([place])    lung   Depression    Hypertension     SURGICAL HISTORY: Past Surgical History:  Procedure Laterality Date   CHEST TUBE INSERTION Right 04/01/2019   Procedure: INSERTION PLEURAL DRAINAGE CATHETER;  Surgeon: [name], MD;  Location: [facility];  Service: Thoracic;  Laterality: Right;   TONSILLECTOMY      SOCIAL HISTORY: Social History   Socioeconomic History   Marital status: Single    Spouse name: Not on file   Number of children: Not on file   Years of education: Not on file   Highest education level: Not on file  Occupational History   Not on file  Social Needs   Financial resource strain: Not on file   Food insecurity    Worry: Not on file    Inability: Not on file   Transportation needs    Medical: Not on file    Non-medical: Not on file  Tobacco Use   Smoking status: Former Smoker    Packs/day: 0.25    Types: Cigarettes    Quit date: 04/14/2014    Years since quitting: 5.0   Smokeless tobacco: Never Used  Substance and Sexual Activity   Alcohol use: Not Currently    Alcohol/week: 21.0 standard drinks    Types: 21 Glasses of wine per week    Comment: socially wine  Drug use: Not Currently    Types: Marijuana   Sexual activity: Never  Lifestyle   Physical activity    Days per week: Not on file    Minutes per session: Not on file   Stress: Not on file  Relationships   Social connections    Talks on phone: Not on file    Gets together: Not on file    Attends religious service: Not on file    Active member of club or organization: Not on file    Attends meetings of clubs or organizations: Not on file    Relationship status: Not on file   Intimate partner violence    Fear of current or ex partner: Not on file    Emotionally abused: Not on file    Physically abused: Not on file     Forced sexual activity: Not on file  Other Topics Concern   Not on file  Social History Narrative   Not on file    FAMILY HISTORY: Family History  Problem Relation Age of Onset   Hypertension Mother    Rheum arthritis Mother    Other Father        suicide   Diabetes Paternal Grandmother    Diabetes Paternal Grandfather     ALLERGIES:  has No Known Allergies.  MEDICATIONS:  Scheduled Meds:  buPROPion  300 mg Oral Daily   feeding supplement (ENSURE ENLIVE)  237 mL Oral TID BM   mirtazapine  15 mg Oral QHS   multivitamin with minerals  1 tablet Oral Daily   prednisoLONE acetate  1 drop Left Eye QID   sodium chloride flush  3 mL Intravenous Once   Continuous Infusions:  sodium chloride 100 mL/hr at 04/17/19 2040   PRN Meds:.acetaminophen **OR** acetaminophen, albuterol, hydrOXYzine, morphine CONCENTRATE, traZODone  REVIEW OF SYSTEMS:    10 Point review of Systems was done is negative except as noted above.   LABORATORY DATA:  I have reviewed the data as listed  . CBC Latest Ref Rng & Units 04/18/2019 04/17/2019 04/16/2019  WBC 4.0 - 10.5 K/uL 10.5 9.3 -  Hemoglobin 12.0 - 15.0 g/dL 8.4(L) 8.5(L) 8.7(L)  Hematocrit 36.0 - 46.0 % 26.3(L) 25.7(L) 25.6(L)  Platelets 150 - 400 K/uL 403(H) 418(H) -   . CBC    Component Value Date/Time   WBC 10.5 04/18/2019 0405   RBC 3.16 (L) 04/18/2019 0405   HGB 8.4 (L) 04/18/2019 0405   HCT 26.3 (L) 04/18/2019 0405   PLT 403 (H) 04/18/2019 0405   MCV 83.2 04/18/2019 0405   MCH 26.6 04/18/2019 0405   MCHC 31.9 04/18/2019 0405   RDW 16.2 (H) 04/18/2019 0405   LYMPHSABS 1.2 04/18/2019 0405   MONOABS 1.3 (H) 04/18/2019 0405   EOSABS 0.5 04/18/2019 0405   BASOSABS 0.1 04/18/2019 0405    . CMP Latest Ref Rng & Units 04/18/2019 04/17/2019 04/16/2019  Glucose 70 - 99 mg/dL 89 89 99  BUN 8 - 23 mg/dL 5(L) 6(L) 5(L)  Creatinine 0.44 - 1.00 mg/dL 0.88 0.77 0.82  Sodium 135 - 145 mmol/L 140 140 140  Potassium 3.5 - 5.1  mmol/L 3.9 3.6 4.1  Chloride 98 - 111 mmol/L 111 114(H) 113(H)  CO2 22 - 32 mmol/L 20(L) 20(L) 21(L)  Calcium 8.9 - 10.3 mg/dL 8.0(L) 7.8(L) 7.5(L)  Total Protein 6.5 - 8.1 g/dL - 4.6(L) 4.7(L)  Total Bilirubin 0.3 - 1.2 mg/dL - 1.0 <0.1(L)  Alkaline Phos 38 - 126 U/L - 68 80  AST 15 - 41 U/L - 15 16  ALT 0 - 44 U/L - 17 19     RADIOGRAPHIC STUDIES: I have personally reviewed the radiological images as listed and agreed with the findings in the report. Dg Chest 2 View  Result Date: 04/15/2019 CLINICAL DATA:  Chest pain and dizziness. Lung carcinoma. EXAM: CHEST - 2 VIEW COMPARISON:  04/02/2019 FINDINGS: Heart size remains normal. Left chest tube remains in place. Moderate right pleural effusion is increased in size since previous study. No pneumothorax visualized. Multiple base masses are again seen in the right lung apex, without significant change. Mediastinal and right hilar lymphadenopathy remains stable. Left lung remains clear. IMPRESSION: Moderate right pleural effusion, increased in size since prior study. Right chest tube remains in place. Multiple pleural base masses in right lung apex, without significant change. No significant change in mediastinal and right hilar lymphadenopathy. Electronically Signed   By: Marlaine Hind M.D.   On: 04/15/2019 13:18   Dg Chest 2 View  Result Date: 03/26/2019 CLINICAL DATA:  Right-sided chest pain and shortness of breath EXAM: CHEST - 2 VIEW COMPARISON:  10/28/2017 FINDINGS: Cardiac shadow is within normal limits. Right-sided pleural effusion and basilar consolidation is noted. Significant increased density is noted in the right paratracheal region suspicious for lymphadenopathy. Changes are suspicious for underlying neoplasm. Pleural based mass lesions in the right apex are noted as well. IMPRESSION: Changes suspicious for right-sided pulmonary neoplasm with pleural base metastatic disease and mediastinal adenopathy. Large right pleural effusion is  noted with likely underlying atelectasis. CT of the chest is recommended with contrast for further evaluation. Electronically Signed   By: Inez Catalina M.D.   On: 03/26/2019 23:03   Ct Head W & Wo Contrast  Result Date: 03/28/2019 CLINICAL DATA:  Lung mass. Rule out intracranial metastases. EXAM: CT HEAD WITHOUT AND WITH CONTRAST TECHNIQUE: Contiguous axial images were obtained from the base of the skull through the vertex without and with intravenous contrast CONTRAST:  130mL OMNIPAQUE IOHEXOL 300 MG/ML  SOLN COMPARISON:  None. FINDINGS: Brain: There is no evidence of acute infarct, intracranial hemorrhage, mass, midline shift, or extra-axial fluid collection. The ventricles and sulci are normal. No abnormal enhancement is identified. Vascular: Calcified atherosclerosis at the skull base. Major dural venous sinuses and large arteries at the base of the brain are patent. Skull: No fracture or focal osseous lesion. Sinuses/Orbits: Visualized paranasal sinuses and mastoid air cells are clear. Left cataract extraction is noted. Other: None. IMPRESSION: Unremarkable CT appearance of the brain. No evidence of intracranial metastatic disease. Electronically Signed   By: Logan Bores M.D.   On: 03/28/2019 13:37   Ct Chest W Contrast  Result Date: 03/27/2019 CLINICAL DATA:  Pleural effusion.  Increasing shortness of breath EXAM: CT CHEST WITH CONTRAST TECHNIQUE: Multidetector CT imaging of the chest was performed during intravenous contrast administration. CONTRAST:  5mL OMNIPAQUE IOHEXOL 300 MG/ML  SOLN COMPARISON:  Chest x-ray from earlier today FINDINGS: Cardiovascular: No cardiomegaly. No pericardial effusion. Malignant distortion/narrowing of right-sided pulmonary arteries. No acute vascular finding. Mediastinum/Nodes: Right upper lobe mass encasing the central vessels with heterogeneous central enhancement attributed to necrosis. Thoracic adenopathy at the right hilum, subcarinal station, paratracheal  station, and AP window, and left pulmonary hilum. Confluent right hilar nodal mass measures up to 4.3 cm on axial slices. Lungs/Pleura: Right upper lobe mass which is branching and blends with the hilar mass, limiting reproducible measurement. There is a large right pleural effusion with multifocal pleural nodularity. No  left lung metastasis. No suspected superimposed edema or pneumonia. Upper Abdomen: No acute finding or evident metastasis. Musculoskeletal: Advanced thoracic spine degeneration with multilevel osteitis. No acute or aggressive finding. 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 The right pleural effusion is large volume. Electronically Signed   By: Monte Fantasia M.D.   On: 03/27/2019 09:37   Ct Angio Chest Pe W And/or Wo Contrast  Result Date: 04/15/2019 CLINICAL DATA:  68 year old female with acute shortness of breath for several weeks. Recent diagnosis of stage IV lung cancer. EXAM: CT ANGIOGRAPHY CHEST WITH CONTRAST TECHNIQUE: Multidetector CT imaging of the chest was performed using the standard protocol during bolus administration of intravenous contrast. Multiplanar CT image reconstructions and MIPs were obtained to evaluate the vascular anatomy. CONTRAST:  63mL OMNIPAQUE IOHEXOL 300 MG/ML  SOLN COMPARISON:  03/27/2019 CT and prior studies. FINDINGS: Cardiovascular: This is a technically satisfactory study. No pulmonary emboli are identified. Heart size is normal. No thoracic aortic aneurysm or pericardial effusion. Mediastinum/Nodes: Bilateral mediastinal and hilar lymphadenopathy are unchanged with the index RIGHT hilar node measuring 3.6 x 4.3 cm (series 7: Image 63). No new mediastinal abnormalities are identified. Lungs/Pleura: Confluent and nodular masses within the MEDIAL RIGHT UPPER lobe again identified and unchanged with approximate measurements of 5.3 x 7.8 x 6.7 cm. This mass is again  noted to case the central RIGHT pulmonary vessels. There is been interval placement of a RIGHT pleural catheter with decrease in loculated RIGHT pleural effusion but still moderate to large. Pleural nodules are again identified. Associated atelectasis within the RIGHT lung noted. Interlobular septal thickening throughout the UPPER and mid RIGHT lung noted. A 4 mm LEFT UPPER lobe nodule (8:40) is unchanged. Mild LEFT basilar atelectasis noted. No pneumothorax. Upper Abdomen: No acute abnormality. Musculoskeletal: No acute abnormality. Sclerosis within scattered cervical, thoracic and UPPER lumbar vertebra noted and may be related to degenerative changes but nonspecific. Review of the MIP images confirms the above findings. IMPRESSION: 1. No evidence of pulmonary emboli. 2. Little significant change in RIGHT UPPER lobe mass/masses, bilateral mediastinal and hilar lymphadenopathy and pleural masses compatible with malignancy/metastatic disease. 3. RIGHT pleural catheter in place with decrease in loculated RIGHT pleural effusion since the prior study, but continues to be moderate to large. Electronically Signed   By: Margarette Canada M.D.   On: 04/15/2019 17:32   Ct Abdomen Pelvis W Contrast  Result Date: 03/28/2019 CLINICAL DATA:  Newly diagnosed right lung carcinoma. Staging. EXAM: CT ABDOMEN AND PELVIS WITH CONTRAST TECHNIQUE: Multidetector CT imaging of the abdomen and pelvis was performed using the standard protocol following bolus administration of intravenous contrast. CONTRAST:  127mL OMNIPAQUE IOHEXOL 300 MG/ML  SOLN COMPARISON:  Chest CT on 03/27/2019 FINDINGS: Lower Chest: Right pleural effusion with enhancing pleural soft tissue density and bilateral hilar lymphadenopathy, as demonstrated on recent chest CT. Hepatobiliary: No hepatic masses identified. Gallbladder is unremarkable. No evidence of biliary ductal dilatation. Pancreas:  No mass or inflammatory changes. Spleen: Within normal limits in size and  appearance. Adrenals/Urinary Tract: No adrenal or renal masses identified. No evidence of hydronephrosis. Enhancing mucosal soft tissue mass is seen in the urinary bladder along the right posterior wall measuring 2.0 x 1.9 cm. This is highly suspicious for primary bladder carcinoma. Stomach/Bowel: No evidence of obstruction, inflammatory process or abnormal fluid collections. Vascular/Lymphatic: No pathologically enlarged lymph nodes. No abdominal aortic aneurysm. Aortic atherosclerosis. Reproductive:  No mass or other significant abnormality.  Other:  None. Musculoskeletal: No suspicious bone lesions identified. Severe lumbar spine degenerative disc disease noted. IMPRESSION: No evidence of metastatic lung carcinoma within the abdomen or pelvis. 2 cm mucosal soft tissue mass in the urinary bladder, highly suspicious for primary bladder carcinoma. Recommend urology referral for cystoscopy. Malignant right pleural effusion and lymphadenopathy, as better demonstrated on recent chest CT. Electronically Signed   By: Marlaine Hind M.D.   On: 03/28/2019 12:07   Dg Chest Port 1 View  Result Date: 04/02/2019 CLINICAL DATA:  Pleural effusion, shortness of breath EXAM: PORTABLE CHEST 1 VIEW COMPARISON:  04/01/2019 FINDINGS: Small bore right-sided pleural drainage catheter remains unchanged in positioning. Stable heart size. Bulky right hilar adenopathy. Rounded peripheral opacities at the right lung apex and lateral right chest wall, unchanged. Small right pleural effusion persists. Bandlike opacity in the periphery of the right lung may represent atelectasis versus loculated pleural fluid within the minor fissure. Increasing hazy opacities within the right mid and lower lung field. Left lung remains clear. No pneumothorax. IMPRESSION: 1. Persistent small right pleural effusion status post pleural drainage catheter placement. No pneumothorax. 2. Increasing opacity within the right lung, atelectasis versus infection.  Electronically Signed   By: Davina Poke M.D.   On: 04/02/2019 09:21   Dg Chest Port 1 View  Result Date: 04/01/2019 CLINICAL DATA:  68 year old female with a history of right tunneled pleural tube placement. EXAM: PORTABLE CHEST 1 VIEW COMPARISON:  March 31, 2019 FINDINGS: Cardiomediastinal silhouette unchanged in size and contour. Compared to the prior plain film there has been significant reduction in the right-sided pleural fluid with lentiform opacity at the lateral right chest, and persisting opacity at the right lung apex. Blunting of the right costophrenic angle. Lobulated opacity in the right hilar region likely corresponds to adenopathy on prior CT. Pleural catheter at the left lung base.  No pneumothorax. Reticular opacities throughout the right lung. Asymmetric elevation of the right hemidiaphragm Left lung relatively well aerated with no large left pleural effusion or pneumothorax. IMPRESSION: Interval placement of right-sided pleural catheter with near complete resolution of right-sided pleural effusion, and no pneumothorax. Lobulated soft tissue in the right hilar region likely corresponds to adenopathy on prior CT. Peripheral opacities at the right apex and right lateral lung may represent trapped pleural fluid or soft tissue implants. Electronically Signed   By: Corrie Mckusick D.O.   On: 04/01/2019 16:00   Dg Chest Port 1 View  Result Date: 03/31/2019 CLINICAL DATA:  Shortness of breath.  Cough. EXAM: PORTABLE CHEST 1 VIEW COMPARISON:  Chest radiographs, most recent 03/27/2019. Chest CT, 03/27/2019. FINDINGS: There has been interval worsening. There is opacification of most of the right hemithorax with aeration of a portion of the right upper lobe at the apex. Pleural base masses noted at the right apex stable from prior exam. Left lung is hyperexpanded, but clear. No left pleural effusion. No pneumothorax. IMPRESSION: 1. Significant interval worsening. There is opacification of most  of the right hemithorax. This is likely a combination of an enlarged pleural effusion with atelectasis and lung masses and nodules. 2. Left lung remains clear. Electronically Signed   By: Lajean Manes M.D.   On: 03/31/2019 07:59   Dg Chest Port 1 View  Result Date: 03/27/2019 CLINICAL DATA:  Status post right-sided thoracentesis EXAM: PORTABLE CHEST 1 VIEW COMPARISON:  03/26/2019 FINDINGS: Heart size is within normal limits. Unchanged appearance of large irregular right hilar mass. Multiple right-sided pleural based masses. Interval decrease in right-sided  pleural effusion, trace residual pleural fluid remaining. No pneumothorax. Left lung is clear. IMPRESSION: 1. Interval decrease in a right sided pleural effusion. No pneumothorax. 2. Additional findings are unchanged from prior. Electronically Signed   By: Davina Poke M.D.   On: 03/27/2019 15:59   Dg C-arm 1-60 Min-no Report  Result Date: 04/01/2019 Fluoroscopy was utilized by the requesting physician.  No radiographic interpretation.    ASSESSMENT & PLAN:   Echo B Rogersis a68 y.o.femalewith:  1. 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 Malignant pleural effusion with rapid reaccumulation s/p indwelling rt sided chest tube 3. 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 Extensive smoking history. 5. Anemia -- likely from blood loss from pleural effusion and Anemia of chronic disease from metastatic malignancy. PLAN: -goals of care discussed had with patient and her daughter at bedside. -patient with significant anxiety and subjective air hungry -- would recommend consideration of low dose morphine for palliative of dyspnea -appropriate positioning of chest tube to optimize drainage of her loculated effusion -check ferritin, iron profile and replace Iron IV to maintain ferritin >250 -urology consultation for bladder tumor -pending foundation one testing to determine  presence of actionable mutation.  -would recommend sending additional pleural fluid for cytology in case additional sample needed for mutation testing -discussed treatment approaches including best supportive cares through hospice vs palliative chemotherapy. -patient prefers to consider palliative treatments -will plan for outpatient treatment with carboplatin/Alimta/Pembrolizumab with G-CSF support -if patient has a targetable mutation then will switch to targeted treatment options. -tessalon perles prn for cough -may consider addition marinol for nausea and to boost appetite -shall setup for outpatient treatment to start in about 1 week   I spent 30 minutes counseling the patient face to face. The total time spent in the appointment was 40 minutes and more than 50% was on counseling and direct patient cares.    Sullivan Lone MD [place] AAHIVMS [facility] Hematology/Oncology Physician [facility]  (Office):       [phone] (Work cell):  [phone] (Fax):           [phone]

## 2019-04-19 NOTE — Discharge Summary (Signed)
Name: Kristen Chavez MRN: [id number] DOB: [birth year] 68 y.o. PCP: [name], MD  Date of Admission: 04/15/2019 12:20 PM Date of Discharge: 04/19/2019 Attending Physician: Bartholomew Crews, MD  Discharge Diagnosis: 1. Symptomatic Anemia 2. Hypotension 3. Lung Adenocarcinoma with pleural metastasis 4. Bladder Mass 5. Anorexia  Discharge Medications: Allergies as of 04/19/2019   No Known Allergies     Medication List    STOP taking these medications   citalopram 40 MG tablet Commonly known as: CELEXA     TAKE these medications   acetaminophen 500 MG tablet Commonly known as: TYLENOL Take 1 tablet (500 mg total) by mouth every 6 (six) hours as needed for moderate pain or headache.   albuterol 108 (90 Base) MCG/ACT inhaler Commonly known as: VENTOLIN HFA Inhale 1-2 puffs into the lungs every 6 (six) hours as needed for wheezing or shortness of breath.   benzonatate 100 MG capsule Commonly known as: TESSALON Take 1 capsule (100 mg total) by mouth 3 (three) times daily as needed for cough.   buPROPion 300 MG 24 hr tablet Commonly known as: WELLBUTRIN XL TAKE 1 TABLET (300 MG TOTAL) BY MOUTH DAILY.   cetirizine 10 MG tablet Commonly known as: ZYRTEC Take 1 tablet (10 mg total) by mouth daily. What changed:   when to take this  reasons to take this   fluticasone 50 MCG/ACT nasal spray Commonly known as: FLONASE Place 2 sprays into both nostrils daily. What changed:   when to take this  reasons to take this   hydrOXYzine 25 MG tablet Commonly known as: ATARAX/VISTARIL Take 1 tablet (25 mg total) by mouth 3 (three) times daily as needed. What changed: reasons to take this   Lavender Oil Oil Apply 1 application topically 2 (two) times daily as needed (knee pain).   methotrexate 2.5 MG tablet Commonly known as: RHEUMATREX Take 25 mg by mouth once a week. Take one tablet by mouth once a week on fridays.  Caution:Chemotherapy. Protect from light.    mirtazapine 15 MG tablet Commonly known as: REMERON Take 1 tablet (15 mg total) by mouth at bedtime.   morphine 10 MG/5ML solution Take 1.3 mLs (2.6 mg total) by mouth every 8 (eight) hours as needed (air hunger (difficulty breathing) and anxiety).   prednisoLONE acetate 1 % ophthalmic suspension Commonly known as: PRED FORTE Place 1 drop into the left eye 4 (four) times daily.   traZODone 50 MG tablet Commonly known as: DESYREL Take 0.5-1 tablets (25-50 mg total) by mouth at bedtime as needed for sleep.            Durable Medical Equipment  (From admission, onward)         Start     Ordered   04/19/19 1512  For home use only DME Walker rolling  [facility])  Once    Question:  Patient needs a walker to treat with the following condition  Answer:  Adenocarcinoma of lung, stage 4, unspecified laterality ([place])   04/19/19 1517          Disposition and follow-up:   [name] was discharged from [facility] in Good condition.  At the hospital follow up visit please address:  1. Symptomatic Anemia: Ensure patient's hemoglobin levels stay stable 2. Hypotension: likely secondary to lack of intake. Encourage increase in diet 3. Lung Adenocarcinoma with pleural metastasis: Follow up with Dr. Irene Limbo for chemotherapy initiation.  Ensure patient is tolerating morphine for air hunger. 4.  Bladder Mass: Appointment made for [facility].  5. Anorexia: Mirtazapine was started, make sure patient is tolerating well.  6.  Labs / imaging needed at time of follow-up: CBC, BMP  7.  Pending labs/ test needing follow-up: none  Follow-up Appointments: Follow-up [place] Follow up in 1 week(s).   Why: Our clinic will call you to schedule a follow up visit within the next week.  Contact information: [address] [place] [phone]       [name], MD. [name] on 04/19/2019.   Specialty:  Cardiothoracic Surgery Why: PA/LAT CXR to be taken (at [place] which is in the same building as Dr. Lucianne Lei Trigt's office) on 10/28 at 10:00 am;Appointment time is at 10:30 am Contact information: [place] [address] [phone]        [name], MD. Go on 04/23/2019.   Specialty: Urology Why: Appointment is at 11:45 am.  Contact information: [name] [address] [phone]        [name], MD. Schedule an appointment as soon as possible for a visit in 1 week(s).   Specialties: Hematology, Oncology Contact information: [place] [address] [place] Hospital Course by problem list: 1. Symptomatic Anemia [name] presented to the ED with complaints of dizziness and low blood pressure. Most likely 2/2 to ongoing serosanguineous drainage from her Pleurx catheter and poor nutrition in the setting of malignant carcinoma. She has a low MCV but her iron studies were most consistent with anemia of chronic disease. Hgb 7.0 on admission was but likely hemoconcentrated. She received 2 units of PRBCs and hemoglobin was able to stabilize to 8.7.  Hemoglobin on discharge was 8.5.  2. Hypotension: Likely secondary to decreased p.o. intake as all her antihypertensives were stopped in her prior admission.  With multiple boluses of normal saline she was able to normalize to the low 428J systolic.  Highest BP reading during entire admission was 681 systolic.  Started mirtazapine to help with appetite.  3. Lung Adenocarcinoma with pleural metastasis Patient was diagnosed at her prior admission and met with Dr. Irene Limbo.  She was not able to set up an outpatient follow-up prior to her admission for symptomatic anemia.  She requested that she would like to meet with him again while inpatient to find out her options.  After meeting with Dr. Irene Limbo, she decided to go for chemotherapy  treatment that will be initiated in the outpatient setting.  Follow-up to be set up by Dr. Irene Limbo.  4. Bladder Mass Noted on imaging from prior admission however Dr. Irene Limbo from oncology recommended we have a urology referral made.  Contact[facility] and made her an appointment for after discharge.  5. Anorexia Begin mirtazapine to stimulate appetite.  We have stopped citalopram to avoid serotonin syndrome.  Discharge Vitals:   BP 108/78 (BP Location: Right Arm)   Pulse (!) 105   Temp 98.1 F (36.7 C) (Oral)   Resp 19   Ht '5\' 3"'$  (1.6 m)   Wt 61.5 kg   SpO2 100%   BMI 24.02 kg/m   Pertinent Labs, Studies, and Procedures:   Hemoglobin 10/5: 7.0 10/6: 7.0 10/7: 8.7 10/8: 8.5 10/9: 8.5  Chest x-ray 04/15/2019 IMPRESSION: Moderate right pleural effusion, increased in size since prior study. Right chest tube remains  in place.  Multiple pleural base masses in right lung apex, without significant change.  No significant change in mediastinal and right hilar Lymphadenopathy.  CTA chest, PE rule out: 04/15/2019 IMPRESSION: 1. No evidence of pulmonary emboli. 2. Little significant change in RIGHT UPPER lobe mass/masses, bilateral mediastinal and hilar lymphadenopathy and pleural masses compatible with malignancy/metastatic disease. 3. RIGHT pleural catheter in place with decrease in loculated RIGHT pleural effusion since the prior study, but continues to be moderate to large.  Discharge Instructions: Discharge Instructions    Call MD for:  difficulty breathing, headache or visual disturbances   Complete by: As directed    Call MD for:  extreme fatigue   Complete by: As directed    Call MD for:  persistant dizziness or light-headedness   Complete by: As directed    Call MD for:  persistant nausea and vomiting   Complete by: As directed    Call MD for:  severe uncontrolled pain   Complete by: As directed    Call MD for:  temperature >100.4   Complete by: As  directed    Diet - low sodium heart healthy   Complete by: As directed    Discharge instructions   Complete by: As directed    Thank you for allowing [place] to participate in your care.   Please make sure to attend all of your follow up appointments. The times and dates are listed on your discharge paperwork. If you have any questions or concerns, do not hesitate to call our office at [phone].   Increase activity slowly   Complete by: As directed       Signed: Dr. Jose Persia Internal Medicine PGY-1  Pager: [phone] 04/19/2019, 5:29 PM

## 2019-04-19 NOTE — TOC Transition Note (Addendum)
Transition of Care [facility]) - CM/SW Discharge Note   Patient Details  Name: Kristen Chavez MRN: [id number] Date of Birth: August 28, 1950  Transition of Care [facility]) CM/SW Contact:  Maryclare Labrador, RN Phone Number: 04/19/2019, 1:18 PM   Clinical Narrative:   PTA from home independent - daughter helps with ADLS if need - pt declined [place] aide.  Pt is active with [facility]mings for [facility] , PT and OT.  Pt informed CM that she still has oxygen supply from [employer] and is in the process of applying for Medicare A, B, and D and should have it approved by next week.  Pt is aware that [facility] will only cover oxygen with [employer] until 9/24.  Pt informed CM that as soon as she gets approval from Medicare she is aware that she needs to inform [employer] immediately.  Pt is active with [facility] and CM instructed her to contact PCP if she nears the 30 day date and still has not yet secured payment for her home oxygen - pt states she understood.  CM contacted Alvis Lemmings and informed of admit and discharge home today.  Pt confirms she has a Corporate investment banker in the home - RW declined.  Pt declined needing assistance with [employer] resource bank.    Final next level of care: [place]     Patient Goals and CMS Choice Patient states their goals for this hospitalization and ongoing recovery are:: Pt states she is just ready to get back home CMS Medicare.gov Compare Post Acute Care list provided to:: Patient Choice offered to / list presented to : Patient  Discharge Placement                       Discharge Plan and Services                          HH Arranged: RN, PT, OT [facility] Agency: [place] Date [place]: 04/19/19 Time [place]: 9 Representative spoke with at [place]: [place] ([place]) Interventions     Readmission Risk Interventions No flowsheet data found.

## 2019-04-19 NOTE — Progress Notes (Signed)
PT Cancellation Note  Patient Details Name: IFEOMA VALLIN MRN: [id number] DOB: Dec 08, 1950   Cancelled Treatment:    Reason Eval/Treat Not Completed: Pain limiting ability to participate(recent medication) Patient reports unable to ambulate now but would like to ambulate later. RN aware.    Claretha Cooper 04/19/2019, 11:06 AM [place] Pager [phone] Office [phone]

## 2019-04-19 NOTE — Progress Notes (Signed)
..................    Vital Signs MEWS/VS Documentation      04/19/2019 0802 04/19/2019 0945 04/19/2019 1000 04/19/2019 1300   MEWS Score:  0  3  3  2    MEWS Score Color:  Green  Yellow  Yellow  Yellow   Resp:  19  -  -  -   Pulse:  100  (!) 120  -  (!) 111   BP:  129/83  95/81  -  113/81   Temp:  98.9 F (37.2 C)  -  -  98.1 F (36.7 C)   O2 Device:  -  -  -  Nasal Cannula   O2 Flow Rate (L/min):  -  -  -  2.5 L/min   Level of Consciousness:  -  -  Alert  -      Contacted doctor about BP of 95/81 and heart rate of 120. Contacted physician again when blood pressure came up to 113/81. Pt current heart rate 111.      Kristen Chavez 04/19/2019,1:31 PM

## 2019-04-19 NOTE — Progress Notes (Addendum)
      Kristen Chavez       [place] [address]             [phone]           Subjective: Patient states breathing is better this am. She did not have pain during drainage yesterday.  Objective: Vital signs in last 24 hours: Temp:  [99.2 F (37.3 C)-99.6 F (37.6 C)] 99.3 F (37.4 C) (10/08 2325) Pulse Rate:  [106-109] 106 (10/08 2325) Resp:  [16] 16 (10/08 1608) BP: (106-123)/(76-92) 106/76 (10/08 2325) SpO2:  [100 %] 100 % (10/08 2325)      Intake/Output from previous day: 10/08 0701 - 10/09 0700 In: -  Out: 1200 [Urine:400; Chest Tube:800]   Physical Exam:  Cardiovascular: RRR Pulmonary: Slightly diminished right base    Lab Results: CBC: Recent Labs    04/18/19 0405 04/19/19 0619  WBC 10.5 12.6*  HGB 8.4* 8.5*  HCT 26.3* 25.6*  PLT 403* 394   BMET:  Recent Labs    04/17/19 0408 04/18/19 0405  NA 140 140  K 3.6 3.9  CL 114* 111  CO2 20* 20*  GLUCOSE 89 89  BUN 6* 5*  CREATININE 0.77 0.88  CALCIUM 7.8* 8.0*    PT/INR: No results for input(s): LABPROT, INR in the last 72 hours. ABG:  INR: Will add last result for INR, ABG once components are confirmed Will add last 4 CBG results once components are confirmed  Assessment/Plan:  1. CV - SR 2.  Pulmonary - Had right Pleur X catheter placed 09/21 for recurrent, malignant pleural effusion. On 2 liters of oxygen via [place] (and was on PTA). 800 cc removed from right Pleur X drain yesterday. Per Dr. Prescott Gum, daily right pleur X drainage while in hospital. Will  continue QOD drainage at discharge. 3. Anemia-H and H this am 8.5 and 25.6. She has had previous transfusion 4. Patient seen by oncology and palliative care-appreciate their assistance 5. Will arrange follow up and see PRN.  Donielle M ZimmermanPA-C 04/19/2019,7:52 AM [phone]  R pleurx cath drained 800 cc  Last session Cont Pleyrx drainage M-W -F at home Will follow as out patient  patient examined and medical  record reviewed,agree with above note. Tharon Aquas Trigt III 04/19/2019

## 2019-04-19 NOTE — Discharge Instructions (Signed)
Daily Pleurix drainage to be every Monday/Wednesday/Friday If <150 ml /drainage session x3 consecutive occasions on  QOD drainage schedule- call TCTS office  [phone]) for evaluation and possible removal Please record output and bring this record to office appointment with Dr. Prescott Kristen Chavez

## 2019-04-19 NOTE — Progress Notes (Addendum)
Subjective:   Kristen Chavez reports she is feeling a bit better today. She received Morphine this morning for SOB, and reports it helped ease her panic when feeling SOB, as well as let her relax better. She did not eat any of her lunch because the interruptions of people coming in and out of the room lowered her appetite. She endorses feeling generally weak.   She feels mostly ready to go home today but gets nervous thinking about it because it is still hard to grasp that she is having difficulty ADL. She feels bad asking her daughter for help all the time, especially since her daughter is in school and has two children. We discussed that we will make sure home health and an aide are set up, so she won't have to lean on her daughter as much.   Objective:  Vital signs in last 24 hours: Vitals:   04/18/19 0745 04/18/19 1608 04/18/19 1939 04/18/19 2325  BP: 109/84 110/90 (!) 123/92 106/76  Pulse: 93 (!) 109 (!) 107 (!) 106  Resp: 16 16    Temp: 98.6 F (37 C) 99.2 F (37.3 C) 99.6 F (37.6 C) 99.3 F (37.4 C)  TempSrc: Oral Oral Oral Oral  SpO2: 100% 100% 100% 100%  Weight:      Height:       Physical Exam Constitutional:      Appearance: She is normal weight.  Pulmonary:     Effort: No respiratory distress.     Comments: Course breath sounds with cough during deep inspiration Skin:    General: Skin is warm and dry.  Neurological:     General: No focal deficit present.     Mental Status: She is alert and oriented to person, place, and time. Mental status is at baseline.  Psychiatric:        Mood and Affect: Affect is tearful (appropriately).        Behavior: Behavior normal. Behavior is cooperative.   Assessment/Plan:  Active Problems:   Adenocarcinoma (HCC)   Pleural effusion, malignant   Anemia   Adenocarcinoma of right lung, stage 4 (HCC)   Counseling regarding advance care planning and goals of care   Malignant pleural effusion  # Adenocarcinoma of the Lung Stage  IV # Metastatic cancer to the pleural space Kristen Chavez was able to see Dr. Irene Limbo yesterday and start the process of initiating treatment. She will follow up with him in the outpatient setting in 1 week. Dr. Irene Limbo requested some additional cytology be done on the pleural fluid that is drained the pleurx daily, and the nurse has been notified to sent the fluid to the lab. CVTS has recommended pleurx be drained daily now, and home health nurse has been ordered to assist with this.   - CVTS on board and we appreciate their recommendations - Drain pleural fluid with vacuum container daily - Oncology is on board and we appreciate their recommendations.  - Will discharge with Morphine for air hunger  # Symptomatic anemia: Likely secondary to blood loss from Pleurex. S/p two transfusion on day of admission but has stabilized since in the mid 8.0s. No additional transfusions required at this time. Safe for discharge with close outpatient follow up and lab work.   # Hypotension: Continue to hold all prior BP medications that include HCTZ and Lisinopril. Continue Mirtazapine for anorexia.   # Depression and Anxiety Exacerbated by recent diagnosis of lung cancer. Counsel session with Miquel Dunn this AM that went well. They  will follow up again in 1 week. Will follow up in the outpatient setting as well.   - Anxiety: Hydroxyzine  - Depression: Bupropion and Mirtazapine    Dispo: Anticipated discharge in 0-1 days.   Dr. Jose Persia Internal Medicine PGY-1  Pager: [phone] 04/19/2019, 6:38 AM

## 2019-04-22 ENCOUNTER — Telehealth: Payer: Self-pay | Admitting: *Deleted

## 2019-04-22 ENCOUNTER — Telehealth: Payer: Self-pay | Admitting: Internal Medicine

## 2019-04-22 DIAGNOSIS — I10 Essential (primary) hypertension: Secondary | ICD-10-CM | POA: Diagnosis not present

## 2019-04-22 DIAGNOSIS — N3289 Other specified disorders of bladder: Secondary | ICD-10-CM | POA: Diagnosis not present

## 2019-04-22 DIAGNOSIS — Z48813 Encounter for surgical aftercare following surgery on the respiratory system: Secondary | ICD-10-CM | POA: Diagnosis not present

## 2019-04-22 DIAGNOSIS — C3411 Malignant neoplasm of upper lobe, right bronchus or lung: Secondary | ICD-10-CM | POA: Diagnosis not present

## 2019-04-22 DIAGNOSIS — J910 Malignant pleural effusion: Secondary | ICD-10-CM | POA: Diagnosis not present

## 2019-04-22 DIAGNOSIS — D649 Anemia, unspecified: Secondary | ICD-10-CM | POA: Diagnosis not present

## 2019-04-22 LAB — CYTOLOGY - NON PAP

## 2019-04-22 NOTE — Telephone Encounter (Signed)
Received call from [place] from [place] stating that pt was discharged from the hospital on 10/9.  She saw pt today for the first time.  Stated pt still has low BP 90/60, and pt not on any BP meds.  Pt took in very little amount of food or fluids.  Stated pt was very anxious.  Pt expressed concern about going with hospice vs. Palliative care, not sure if she wanted to pursue with treatment.  HHN encouraged pt to discuss with Dr. Irene Limbo about her concerns. Instructed HHN to also contact pt's PCP for low BP.  Kristen Chavez voiced understanding. Pt will need f/u appt with Dr. Irene Limbo post hospitalization. HHN  Phone     [phone].

## 2019-04-22 NOTE — Telephone Encounter (Signed)
Nurse Agapito Games from [facility] called to let provider office know that patient had one episode of coughing out blood and her BP this morning was 90/60. Pt will most likely go back to the emergency room as she does not feel good. Nurse would like a call back to discuss treatment plan. Please follow up -[phone] p

## 2019-04-22 NOTE — Telephone Encounter (Signed)
Will forward to pcp

## 2019-04-23 ENCOUNTER — Emergency Department (HOSPITAL_COMMUNITY): Payer: Medicare Other

## 2019-04-23 ENCOUNTER — Encounter (HOSPITAL_COMMUNITY): Payer: Self-pay | Admitting: Emergency Medicine

## 2019-04-23 ENCOUNTER — Other Ambulatory Visit: Payer: Self-pay

## 2019-04-23 ENCOUNTER — Inpatient Hospital Stay (HOSPITAL_COMMUNITY)
Admission: EM | Admit: 2019-04-23 | Discharge: 2019-04-26 | DRG: 181 | Disposition: A | Discharge status: Home Health | Payer: Medicare Other | Attending: Internal Medicine | Admitting: Internal Medicine

## 2019-04-23 DIAGNOSIS — Z20828 Contact with and (suspected) exposure to other viral communicable diseases: Secondary | ICD-10-CM | POA: Diagnosis not present

## 2019-04-23 DIAGNOSIS — Z8249 Family history of ischemic heart disease and other diseases of the circulatory system: Secondary | ICD-10-CM

## 2019-04-23 DIAGNOSIS — N329 Bladder disorder, unspecified: Secondary | ICD-10-CM | POA: Diagnosis present

## 2019-04-23 DIAGNOSIS — Z9089 Acquired absence of other organs: Secondary | ICD-10-CM

## 2019-04-23 DIAGNOSIS — R64 Cachexia: Secondary | ICD-10-CM | POA: Diagnosis present

## 2019-04-23 DIAGNOSIS — I10 Essential (primary) hypertension: Secondary | ICD-10-CM | POA: Diagnosis present

## 2019-04-23 DIAGNOSIS — D500 Iron deficiency anemia secondary to blood loss (chronic): Secondary | ICD-10-CM | POA: Diagnosis present

## 2019-04-23 DIAGNOSIS — J910 Malignant pleural effusion: Secondary | ICD-10-CM | POA: Diagnosis not present

## 2019-04-23 DIAGNOSIS — D649 Anemia, unspecified: Secondary | ICD-10-CM | POA: Diagnosis present

## 2019-04-23 DIAGNOSIS — R630 Anorexia: Secondary | ICD-10-CM | POA: Diagnosis present

## 2019-04-23 DIAGNOSIS — R0602 Shortness of breath: Secondary | ICD-10-CM | POA: Diagnosis not present

## 2019-04-23 DIAGNOSIS — D630 Anemia in neoplastic disease: Secondary | ICD-10-CM | POA: Diagnosis present

## 2019-04-23 DIAGNOSIS — I959 Hypotension, unspecified: Secondary | ICD-10-CM | POA: Diagnosis present

## 2019-04-23 DIAGNOSIS — R000 Tachycardia, unspecified: Secondary | ICD-10-CM | POA: Diagnosis not present

## 2019-04-23 DIAGNOSIS — Z6823 Body mass index (BMI) 23.0-23.9, adult: Secondary | ICD-10-CM

## 2019-04-23 DIAGNOSIS — C782 Secondary malignant neoplasm of pleura: Secondary | ICD-10-CM | POA: Diagnosis not present

## 2019-04-23 DIAGNOSIS — M170 Bilateral primary osteoarthritis of knee: Secondary | ICD-10-CM | POA: Diagnosis present

## 2019-04-23 DIAGNOSIS — C672 Malignant neoplasm of lateral wall of bladder: Secondary | ICD-10-CM | POA: Diagnosis not present

## 2019-04-23 DIAGNOSIS — Z79899 Other long term (current) drug therapy: Secondary | ICD-10-CM

## 2019-04-23 DIAGNOSIS — C3411 Malignant neoplasm of upper lobe, right bronchus or lung: Secondary | ICD-10-CM | POA: Diagnosis present

## 2019-04-23 DIAGNOSIS — Z7951 Long term (current) use of inhaled steroids: Secondary | ICD-10-CM

## 2019-04-23 DIAGNOSIS — F419 Anxiety disorder, unspecified: Secondary | ICD-10-CM | POA: Diagnosis present

## 2019-04-23 DIAGNOSIS — Z87891 Personal history of nicotine dependence: Secondary | ICD-10-CM

## 2019-04-23 DIAGNOSIS — Z9981 Dependence on supplemental oxygen: Secondary | ICD-10-CM

## 2019-04-23 DIAGNOSIS — F329 Major depressive disorder, single episode, unspecified: Secondary | ICD-10-CM | POA: Diagnosis present

## 2019-04-23 DIAGNOSIS — Z888 Allergy status to other drugs, medicaments and biological substances status: Secondary | ICD-10-CM

## 2019-04-23 DIAGNOSIS — Z8261 Family history of arthritis: Secondary | ICD-10-CM

## 2019-04-23 DIAGNOSIS — Z978 Presence of other specified devices: Secondary | ICD-10-CM

## 2019-04-23 MED ORDER — SODIUM CHLORIDE 0.9 % IV BOLUS
1000.0000 mL | Freq: Once | INTRAVENOUS | Status: AC
  Administered 2019-04-23: 1000 mL via INTRAVENOUS

## 2019-04-23 MED ORDER — ONDANSETRON HCL 4 MG/2ML IJ SOLN
4.0000 mg | Freq: Once | INTRAMUSCULAR | Status: AC
  Administered 2019-04-23: 4 mg via INTRAVENOUS
  Filled 2019-04-23: qty 2

## 2019-04-23 NOTE — ED Triage Notes (Signed)
Pt. Arrived via ems. Pt. Complains of clot in drainage tube in right upper chest. Tube placed 4 weeks ago. PT. States she has had drainage over the past few days from the tube. Pt. Denies pain.No fever.

## 2019-04-23 NOTE — ED Provider Notes (Signed)
[facility] EMERGENCY DEPARTMENT Provider Note   CSN: [id number] Arrival date & time: 04/23/19  2234     History   Chief Complaint No chief complaint on file.   HPI Kristen Chavez is a 68 y.o. female.     HPI   Kristen Chavez is a 68 y.o. female, with a history of metastatic lung cancer, HTN, presenting to the ED with concern for abnormal pleural catheter output.  States her daughter was draining her pleural catheter, as she does every couple days, and noted a blood clot in the drainage.  She was able to continue to drain fluid after noting the blood clot.  Fluid drained from the catheter is typically bloody.   She also had concerns over the patient's hypotension. Patient states she has had intermittent nausea and vomiting over the last several weeks, vomited once today.  She has also had poor oral intake due to poor appetite. She has shortness of breath at baseline due to her developing metastatic lung cancer.  She has not yet started any treatment for her cancer.  She was started on 2 L supplemental O2 during previous admission and has been using this supplemental oxygen at home.  Denies increased shortness of breath, chest pain, syncope, abdominal pain, fever/chills, diarrhea, urinary symptoms, lower extremity edema, orthopnea, hematochezia/melena, or any other complaints.  Oncologist: Dr. Irene Limbo  Past Medical History:  Diagnosis Date  . Anxiety   . Arthritis    osteoarthritis.  . Cancer ([place])    lung  . Depression   . Hypertension     Patient Active Problem List   Diagnosis Date Noted  . Symptomatic anemia 04/24/2019  . Adenocarcinoma of right lung, stage 4 ([place])   . Counseling regarding advance care planning and goals of care   . Malignant pleural effusion   . Anemia 04/15/2019  . Shortness of breath   . DNR (do not resuscitate) discussion   . Palliative care by specialist   . Pleural effusion, malignant   . Adenocarcinoma ([place]) 03/29/2019  .  Mass of upper lobe of right lung 03/27/2019  . Pleural effusion   . Corn of toe 12/11/2017  . Weight loss, unintentional 12/11/2017  . Marijuana use 12/11/2017  . Smoking 08/26/2013  . ANXIETY 11/25/2007  . DEPRESSION 11/25/2007  . Essential hypertension 11/25/2007  . Osteoarthritis 11/25/2007  . 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 CHEST TUBE INSERTION Right 04/01/2019   Procedure: INSERTION PLEURAL DRAINAGE CATHETER;  Surgeon: [name], MD;  Location: [facility];  Service: Thoracic;  Laterality: Right;  . TONSILLECTOMY       OB History   No obstetric history on file.      Home Medications    Prior to Admission medications   Medication Sig Start Date End Date Taking? Authorizing Provider  acetaminophen (TYLENOL) 500 MG tablet Take 1 tablet (500 mg total) by mouth every 6 (six) hours as needed for moderate pain or headache. Patient taking differently: Take 1,000 mg by mouth every 6 (six) hours as needed for moderate pain or headache.  08/18/17  Yes Hairston, Maylon Peppers, FNP  albuterol (VENTOLIN HFA) 108 (90 Base) MCG/ACT inhaler Inhale 1-2 puffs into the lungs every 6 (six) hours as needed for wheezing or shortness of breath. 04/03/19  Yes Santos-Sanchez, Merlene Morse, MD  buPROPion (WELLBUTRIN XL) 300 MG 24 hr tablet TAKE 1 TABLET (300 MG TOTAL) BY MOUTH DAILY.  03/06/19  Yes [name], MD  cetirizine (ZYRTEC) 10 MG tablet Take 1 tablet (10 mg total) by mouth daily. Patient taking differently: Take 10 mg by mouth daily as needed for allergies.  01/07/19  Yes Newlin, Charlane Ferretti, MD  fluticasone (FLONASE) 50 MCG/ACT nasal spray Place 2 sprays into both nostrils daily. Patient taking differently: Place 2 sprays into both nostrils daily as needed for rhinitis.  01/07/19  Yes Charlott Rakes, MD  hydrOXYzine (ATARAX/VISTARIL) 25 MG tablet Take 1 tablet (25 mg total) by mouth 3 (three) times daily as needed.  Patient taking differently: Take 25 mg by mouth 3 (three) times daily as needed for itching.  04/04/19  Yes Jeanmarie Hubert, MD  Lavender Oil OIL Apply 1 application topically 2 (two) times daily as needed (knee pain).   Yes [provider]  methotrexate (RHEUMATREX) 2.5 MG tablet Take 25 mg by mouth once a week. Take one tablet by mouth once a week on fridays.  Caution:Chemotherapy. Protect from light.   Yes [provider]  mirtazapine (REMERON) 15 MG tablet Take 1 tablet (15 mg total) by mouth at bedtime. 04/19/19  Yes Jose Persia, MD  morphine 10 MG/5ML solution Take 1.3 mLs (2.6 mg total) by mouth every 8 (eight) hours as needed (air hunger (difficulty breathing) and anxiety). 04/19/19  Yes Jose Persia, MD  prednisoLONE acetate (PRED FORTE) 1 % ophthalmic suspension Place 1 drop into the left eye 4 (four) times daily.   Yes [provider]  benzonatate (TESSALON) 100 MG capsule Take 1 capsule (100 mg total) by mouth 3 (three) times daily as needed for cough. Patient not taking: Reported on 04/24/2019 04/19/19   Jose Persia, MD  traZODone (DESYREL) 50 MG tablet Take 0.5-1 tablets (25-50 mg total) by mouth at bedtime as needed for sleep. Patient not taking: Reported on 04/24/2019 04/19/19   Jose Persia, MD    Family History Family History  Problem Relation Age of Onset  . Hypertension Mother   . Rheum arthritis Mother   . Other Father        suicide  . Diabetes Paternal Grandmother   . Diabetes Paternal Grandfather     Social History Social History   Tobacco Use  . Smoking status: Former Smoker    Packs/day: 0.25    Types: Cigarettes    Quit date: 04/14/2014    Years since quitting: 5.0  . Smokeless tobacco: Never Used  Substance Use Topics  . Alcohol use: Not Currently    Alcohol/week: 21.0 standard drinks    Types: 21 Glasses of wine per week    Comment: socially wine  . Drug use: Not Currently    Types: Marijuana     Allergies    Trazodone and nefazodone   Review of Systems Review of Systems  Constitutional: Positive for fatigue. Negative for chills and fever.  Respiratory: Positive for shortness of breath (baseline). Negative for cough.   Cardiovascular: Negative for chest pain and leg swelling.  Gastrointestinal: Positive for nausea and vomiting. Negative for abdominal pain, blood in stool and diarrhea.  Genitourinary: Negative for dysuria, frequency and hematuria.  Neurological: Positive for weakness (generalized). Negative for syncope.  All other systems reviewed and are negative.    Physical Exam Updated Vital Signs BP 90/72 (BP Location: Right Arm)   Pulse (!) 102   Temp 98.7 F (37.1 C)   Resp 18   Ht 5\' 3"  (1.6 m)   Wt 59 kg   SpO2 99%  BMI 23.03 kg/m   Physical Exam Vitals signs and nursing note reviewed.  Constitutional:      General: She is not in acute distress.    Appearance: She is well-developed. She is not diaphoretic.  HENT:     Head: Normocephalic and atraumatic.     Mouth/Throat:     Mouth: Mucous membranes are moist.     Pharynx: Oropharynx is clear.  Eyes:     Conjunctiva/sclera: Conjunctivae normal.  Neck:     Musculoskeletal: Neck supple.  Cardiovascular:     Rate and Rhythm: Normal rate and regular rhythm.     Pulses: Normal pulses.          Radial pulses are 2+ on the right side and 2+ on the left side.       Posterior tibial pulses are 2+ on the right side and 2+ on the left side.     Heart sounds: Normal heart sounds.     Comments: Tactile temperature in the extremities appropriate and equal bilaterally. Not tachycardic on my exam. Pulmonary:     Effort: Pulmonary effort is normal. No respiratory distress.     Breath sounds: Normal breath sounds.  Chest:     Comments: Right pleural catheter in place.  Contains what appears to be diluted bloody fluid. Surrounding skin appears clean, nontender, no erythema, no no swelling. Abdominal:     Palpations: Abdomen  is soft.     Tenderness: There is no abdominal tenderness. There is no guarding.  Genitourinary:    Rectum: Guaiac result negative.     Comments: Rectal Exam:  No external hemorrhoids, fissures, or lesions noted.  No frank blood or melena. No stool burden.  No rectal tenderness. No foreign bodies noted.   Musculoskeletal:     Right lower leg: No edema.     Left lower leg: No edema.  Lymphadenopathy:     Cervical: No cervical adenopathy.  Skin:    General: Skin is warm and dry.  Neurological:     Mental Status: She is alert and oriented to person, place, and time.  Psychiatric:        Mood and Affect: Mood and affect normal.        Speech: Speech normal.        Behavior: Behavior normal.      ED Treatments / Results  Labs (all labs ordered are listed, but only abnormal results are displayed) Labs Reviewed  CBC WITH DIFFERENTIAL/PLATELET - Abnormal; Notable for the following components:      Result Value   WBC 17.8 (*)    RBC 2.49 (*)    Hemoglobin 6.4 (*)    HCT 20.9 (*)    MCH 25.7 (*)    RDW 18.1 (*)    Platelets 420 (*)    Neutro Abs 14.0 (*)    Monocytes Absolute 1.4 (*)    Abs Immature Granulocytes 1.03 (*)    All other components within normal limits  COMPREHENSIVE METABOLIC PANEL - Abnormal; Notable for the following components:   CO2 21 (*)    Glucose, Bld 131 (*)    Calcium 8.0 (*)    Total Protein 5.1 (*)    Albumin 1.9 (*)    GFR calc non Af Amer 58 (*)    All other components within normal limits  URINE CULTURE  SARS CORONAVIRUS 2 (TAT 6-24 HRS)  BRAIN NATRIURETIC PEPTIDE  LACTIC ACID, PLASMA  URINALYSIS, ROUTINE W REFLEX MICROSCOPIC  LACTIC ACID, PLASMA  POC  OCCULT BLOOD, ED  TYPE AND SCREEN  PREPARE RBC (CROSSMATCH)  TROPONIN I (HIGH SENSITIVITY)  TROPONIN I (HIGH SENSITIVITY)   Hemoglobin  Date Value Ref Range Status  04/23/2019 6.4 (LL) 12.0 - 15.0 g/dL Final    Comment:    REPEATED TO VERIFY THIS CRITICAL RESULT HAS VERIFIED AND BEEN  CALLED TO B.JOHNSON,RN BY MELISSA BROGDON ON 10 14 2020 AT 0002, AND HAS BEEN READ BACK.    04/19/2019 8.5 (L) 12.0 - 15.0 g/dL Final  04/18/2019 8.4 (L) 12.0 - 15.0 g/dL Final  04/17/2019 8.5 (L) 12.0 - 15.0 g/dL Final    EKG EKG Interpretation  Date/Time:  Tuesday April 23 2019 23:38:46 EDT Ventricular Rate:  99 PR Interval:    QRS Duration: 80 QT Interval:  340 QTC Calculation: 437 R Axis:   58 Text Interpretation:  Sinus rhythm Borderline T wave abnormalities No significant change since last tracing Confirmed by Orpah Greek [phone]) on 04/24/2019 4:00:56 AM   Radiology Dg Chest 2 View  Result Date: 04/24/2019 CLINICAL DATA:  68 year old female with shortness of breath. EXAM: CHEST - 2 VIEW COMPARISON:  Chest radiograph dated 04/15/2019 and CT dated 04/15/2019 FINDINGS: A right-sided chest tube appears to have been advanced with tip now over the posterior right upper lobe pleural surface. Loculated pleural effusion and right apical pleural based loculated effusion or masses as well as right hilar mass. Overall no significant interval change aeration of the right lung since the radiograph of 04/25/2019. The left lung remains clear. No pneumothorax. Stable cardiomediastinal silhouette. No acute osseous pathology. IMPRESSION: No significant interval change in the appearance of the loculated right pleural effusion and right hilar mass. No pneumothorax. Electronically Signed   By: Anner Crete M.D.   On: 04/24/2019 00:09    Procedures .Critical Care Performed by: Lorayne Bender, PA-C Authorized by: Lorayne Bender, PA-C   Critical care provider statement:    Critical care time (minutes):  35   Critical care time was exclusive of:  Separately billable procedures and treating other patients   Critical care was necessary to treat or prevent imminent or life-threatening deterioration of the following conditions: Symptomatic anemia requiring blood transfusion.   Critical care  was time spent personally by me on the following activities:  Development of treatment plan with patient or surrogate, discussions with consultants, examination of patient, evaluation of patient's response to treatment, obtaining history from patient or surrogate, ordering and performing treatments and interventions, ordering and review of radiographic studies, ordering and review of laboratory studies, pulse oximetry and re-evaluation of patient's condition   I assumed direction of critical care for this patient from another provider in my specialty: no     (including critical care time)  Medications Ordered in ED Medications  0.9 %  sodium chloride infusion (Manually program via Guardrails IV Fluids) (has no administration in time range)  morphine 10 MG/5ML solution 2.5 mg (has no administration in time range)  buPROPion (WELLBUTRIN XL) 24 hr tablet 300 mg (has no administration in time range)  mirtazapine (REMERON) tablet 15 mg (has no administration in time range)  albuterol (PROVENTIL) (2.5 MG/3ML) 0.083% nebulizer solution 2.5 mg (has no administration in time range)  benzonatate (TESSALON) capsule 100 mg (has no administration in time range)  loratadine (CLARITIN) tablet 10 mg (has no administration in time range)  ondansetron (ZOFRAN) injection 4 mg (4 mg Intravenous Given 04/23/19 2336)  sodium chloride 0.9 % bolus 1,000 mL (0 mLs Intravenous Stopped 04/24/19 0204)  sodium chloride 0.9 % bolus 1,000 mL (0 mLs Intravenous Stopped 04/24/19 0345)     Initial Impression / Assessment and Plan / ED Course  I have reviewed the triage vital signs and the nursing notes.  Pertinent labs & imaging results that were available during my care of the patient were reviewed by me and considered in my medical decision making (see chart for details).  Clinical Course as of Apr 24 427  Wed Apr 24, 2019  0233 Spoke with Dr. Sherry Ruffing, IM resident. States they will come see the patient for admission.    [SJ]    Clinical Course User Index [SJ] Merrick Feutz C, PA-C       Patient presents originally concerns with seeing a blood clot in the drainage from the Pleurx tube.  She also complains of generalized weakness and fatigue. She was noted to have some hypotension that had been appreciated previously on her last admission.  Maintained normal mental status no apparent distress. Hemoglobin today of 6.4, down from 8.5 on October 9.  Nontoxic-appearing, afebrile, maintaining excellent SPO2 on her home supplemental 2 L O2. The source of her anemia may actually be blood loss through the Pleurx.  Anemia of chronic disease is also a consideration. Patient admitted via internal medicine teaching service.  Findings and plan of care discussed with Malachy Moan, MD.    Final Clinical Impressions(s) / ED Diagnoses   Final diagnoses:  Symptomatic anemia    ED Discharge Orders    None       Layla Maw 04/24/19 0428    Orpah Greek, MD 04/24/19 [phone]

## 2019-04-23 NOTE — ED Notes (Signed)
ED Provider at bedside. 

## 2019-04-23 NOTE — ED Notes (Signed)
Patient transported to XR. 

## 2019-04-24 ENCOUNTER — Other Ambulatory Visit: Payer: Self-pay | Admitting: *Deleted

## 2019-04-24 ENCOUNTER — Inpatient Hospital Stay (HOSPITAL_COMMUNITY): Payer: Medicare Other

## 2019-04-24 ENCOUNTER — Telehealth: Payer: Self-pay | Admitting: Hematology

## 2019-04-24 ENCOUNTER — Telehealth: Payer: Self-pay | Admitting: Internal Medicine

## 2019-04-24 DIAGNOSIS — D649 Anemia, unspecified: Secondary | ICD-10-CM | POA: Diagnosis present

## 2019-04-24 DIAGNOSIS — F419 Anxiety disorder, unspecified: Secondary | ICD-10-CM | POA: Diagnosis present

## 2019-04-24 DIAGNOSIS — Z9981 Dependence on supplemental oxygen: Secondary | ICD-10-CM | POA: Diagnosis not present

## 2019-04-24 DIAGNOSIS — C3411 Malignant neoplasm of upper lobe, right bronchus or lung: Secondary | ICD-10-CM | POA: Diagnosis present

## 2019-04-24 DIAGNOSIS — Z7951 Long term (current) use of inhaled steroids: Secondary | ICD-10-CM | POA: Diagnosis not present

## 2019-04-24 DIAGNOSIS — J910 Malignant pleural effusion: Secondary | ICD-10-CM | POA: Diagnosis present

## 2019-04-24 DIAGNOSIS — D500 Iron deficiency anemia secondary to blood loss (chronic): Secondary | ICD-10-CM

## 2019-04-24 DIAGNOSIS — Z978 Presence of other specified devices: Secondary | ICD-10-CM | POA: Diagnosis not present

## 2019-04-24 DIAGNOSIS — Z79899 Other long term (current) drug therapy: Secondary | ICD-10-CM | POA: Diagnosis not present

## 2019-04-24 DIAGNOSIS — N329 Bladder disorder, unspecified: Secondary | ICD-10-CM | POA: Diagnosis present

## 2019-04-24 DIAGNOSIS — Z87891 Personal history of nicotine dependence: Secondary | ICD-10-CM | POA: Diagnosis not present

## 2019-04-24 DIAGNOSIS — Z8249 Family history of ischemic heart disease and other diseases of the circulatory system: Secondary | ICD-10-CM | POA: Diagnosis not present

## 2019-04-24 DIAGNOSIS — J90 Pleural effusion, not elsewhere classified: Secondary | ICD-10-CM

## 2019-04-24 DIAGNOSIS — Z9089 Acquired absence of other organs: Secondary | ICD-10-CM | POA: Diagnosis not present

## 2019-04-24 DIAGNOSIS — I959 Hypotension, unspecified: Secondary | ICD-10-CM | POA: Diagnosis present

## 2019-04-24 DIAGNOSIS — D630 Anemia in neoplastic disease: Secondary | ICD-10-CM | POA: Diagnosis present

## 2019-04-24 DIAGNOSIS — F329 Major depressive disorder, single episode, unspecified: Secondary | ICD-10-CM

## 2019-04-24 DIAGNOSIS — R64 Cachexia: Secondary | ICD-10-CM | POA: Diagnosis present

## 2019-04-24 DIAGNOSIS — Z8261 Family history of arthritis: Secondary | ICD-10-CM | POA: Diagnosis not present

## 2019-04-24 DIAGNOSIS — M170 Bilateral primary osteoarthritis of knee: Secondary | ICD-10-CM | POA: Diagnosis present

## 2019-04-24 DIAGNOSIS — I10 Essential (primary) hypertension: Secondary | ICD-10-CM | POA: Diagnosis present

## 2019-04-24 DIAGNOSIS — Z6823 Body mass index (BMI) 23.0-23.9, adult: Secondary | ICD-10-CM | POA: Diagnosis not present

## 2019-04-24 DIAGNOSIS — R630 Anorexia: Secondary | ICD-10-CM | POA: Diagnosis present

## 2019-04-24 DIAGNOSIS — Z888 Allergy status to other drugs, medicaments and biological substances status: Secondary | ICD-10-CM | POA: Diagnosis not present

## 2019-04-24 DIAGNOSIS — C3491 Malignant neoplasm of unspecified part of right bronchus or lung: Secondary | ICD-10-CM

## 2019-04-24 DIAGNOSIS — Z9889 Other specified postprocedural states: Secondary | ICD-10-CM

## 2019-04-24 DIAGNOSIS — Z20828 Contact with and (suspected) exposure to other viral communicable diseases: Secondary | ICD-10-CM | POA: Diagnosis present

## 2019-04-24 DIAGNOSIS — C782 Secondary malignant neoplasm of pleura: Secondary | ICD-10-CM | POA: Diagnosis present

## 2019-04-24 DIAGNOSIS — R0989 Other specified symptoms and signs involving the circulatory and respiratory systems: Secondary | ICD-10-CM

## 2019-04-24 LAB — URINALYSIS, ROUTINE W REFLEX MICROSCOPIC
Bilirubin Urine: NEGATIVE
Glucose, UA: NEGATIVE mg/dL
Hgb urine dipstick: NEGATIVE
Ketones, ur: NEGATIVE mg/dL
Nitrite: NEGATIVE
Protein, ur: NEGATIVE mg/dL
Specific Gravity, Urine: 1.017 (ref 1.005–1.030)
pH: 6 (ref 5.0–8.0)

## 2019-04-24 LAB — COMPREHENSIVE METABOLIC PANEL
ALT: 20 U/L (ref 0–44)
AST: 25 U/L (ref 15–41)
Albumin: 1.9 g/dL — ABNORMAL LOW (ref 3.5–5.0)
Alkaline Phosphatase: 92 U/L (ref 38–126)
Anion gap: 11 (ref 5–15)
BUN: 13 mg/dL (ref 8–23)
CO2: 21 mmol/L — ABNORMAL LOW (ref 22–32)
Calcium: 8 mg/dL — ABNORMAL LOW (ref 8.9–10.3)
Chloride: 103 mmol/L (ref 98–111)
Creatinine, Ser: 0.99 mg/dL (ref 0.44–1.00)
GFR calc Af Amer: >60 mL/min (ref >60)
GFR calc non Af Amer: 58 mL/min — ABNORMAL LOW (ref >60)
Glucose, Bld: 131 mg/dL — ABNORMAL HIGH (ref 70–99)
Potassium: 4.4 mmol/L (ref 3.5–5.1)
Sodium: 135 mmol/L (ref 135–145)
Total Bilirubin: 0.4 mg/dL (ref 0.3–1.2)
Total Protein: 5.1 g/dL — ABNORMAL LOW (ref 6.5–8.1)

## 2019-04-24 LAB — POC OCCULT BLOOD, ED: Fecal Occult Bld: NEGATIVE

## 2019-04-24 LAB — CBC WITH DIFFERENTIAL/PLATELET
Abs Immature Granulocytes: 1.03 10*3/uL — ABNORMAL HIGH (ref 0.00–0.07)
Basophils Absolute: 0.1 10*3/uL (ref 0.0–0.1)
Basophils Relative: 0 %
Eosinophils Absolute: 0.2 10*3/uL (ref 0.0–0.5)
Eosinophils Relative: 1 %
HCT: 20.9 % — ABNORMAL LOW (ref 36.0–46.0)
Hemoglobin: 6.4 g/dL — CL (ref 12.0–15.0)
Immature Granulocytes: 6 %
Lymphocytes Relative: 6 %
Lymphs Abs: 1.1 10*3/uL (ref 0.7–4.0)
MCH: 25.7 pg — ABNORMAL LOW (ref 26.0–34.0)
MCHC: 30.6 g/dL (ref 30.0–36.0)
MCV: 83.9 fL (ref 80.0–100.0)
Monocytes Absolute: 1.4 10*3/uL — ABNORMAL HIGH (ref 0.1–1.0)
Monocytes Relative: 8 %
Neutro Abs: 14 10*3/uL — ABNORMAL HIGH (ref 1.7–7.7)
Neutrophils Relative %: 79 %
Platelets: 420 10*3/uL — ABNORMAL HIGH (ref 150–400)
RBC: 2.49 MIL/uL — ABNORMAL LOW (ref 3.87–5.11)
RDW: 18.1 % — ABNORMAL HIGH (ref 11.5–15.5)
WBC: 17.8 10*3/uL — ABNORMAL HIGH (ref 4.0–10.5)
nRBC: 0 % (ref 0.0–0.2)

## 2019-04-24 LAB — SARS CORONAVIRUS 2 (TAT 6-24 HRS): SARS Coronavirus 2: NEGATIVE

## 2019-04-24 LAB — TROPONIN I (HIGH SENSITIVITY)
Troponin I (High Sensitivity): 6 ng/L (ref <18)
Troponin I (High Sensitivity): 6 ng/L (ref <18)

## 2019-04-24 LAB — PREPARE RBC (CROSSMATCH)

## 2019-04-24 LAB — BRAIN NATRIURETIC PEPTIDE: B Natriuretic Peptide: 20.1 pg/mL (ref 0.0–100.0)

## 2019-04-24 LAB — LACTIC ACID, PLASMA: Lactic Acid, Venous: 1.1 mmol/L (ref 0.5–1.9)

## 2019-04-24 LAB — HEMOGLOBIN AND HEMATOCRIT, BLOOD
HCT: 30.5 % — ABNORMAL LOW (ref 36.0–46.0)
Hemoglobin: 9.6 g/dL — ABNORMAL LOW (ref 12.0–15.0)

## 2019-04-24 MED ORDER — SODIUM CHLORIDE 0.9% IV SOLUTION: Freq: Once | INTRAVENOUS | Status: DC

## 2019-04-24 MED ORDER — BENZONATATE 100 MG PO CAPS: 100.0000 mg | ORAL_CAPSULE | Freq: Three times a day (TID) | ORAL | Status: DC | PRN

## 2019-04-24 MED ORDER — BUPROPION HCL ER (XL) 300 MG PO TB24
300.0000 mg | ORAL_TABLET | Freq: Every day | ORAL | Status: DC
  Administered 2019-04-24 – 2019-04-26 (×3): 300 mg via ORAL
  Filled 2019-04-24 (×2): qty 1
  Filled 2019-04-24: qty 2

## 2019-04-24 MED ORDER — MIRTAZAPINE 15 MG PO TABS
15.0000 mg | ORAL_TABLET | Freq: Every day | ORAL | Status: DC
  Administered 2019-04-24 – 2019-04-25 (×2): 15 mg via ORAL
  Filled 2019-04-24 (×2): qty 1

## 2019-04-24 MED ORDER — ALBUTEROL SULFATE (2.5 MG/3ML) 0.083% IN NEBU
2.5000 mg | INHALATION_SOLUTION | Freq: Four times a day (QID) | RESPIRATORY_TRACT | Status: DC | PRN
  Administered 2019-04-24: 2.5 mg via RESPIRATORY_TRACT
  Filled 2019-04-24: qty 3

## 2019-04-24 MED ORDER — SODIUM CHLORIDE 0.9 % IV BOLUS
1000.0000 mL | Freq: Once | INTRAVENOUS | Status: AC
  Administered 2019-04-24: 02:00:00 1000 mL via INTRAVENOUS

## 2019-04-24 MED ORDER — LORATADINE 10 MG PO TABS
10.0000 mg | ORAL_TABLET | Freq: Every day | ORAL | Status: DC
  Administered 2019-04-24 – 2019-04-26 (×2): 10 mg via ORAL
  Filled 2019-04-24 (×3): qty 1

## 2019-04-24 MED ORDER — MORPHINE SULFATE (CONCENTRATE) 10 MG/0.5ML PO SOLN
2.5000 mg | Freq: Three times a day (TID) | ORAL | Status: DC | PRN
  Administered 2019-04-25: 2.6 mg via ORAL
  Filled 2019-04-24: qty 0.5

## 2019-04-24 NOTE — Consult Note (Signed)
[address]       [place] [address]             [phone]        Kristen Chavez [place] Medical Record #[id number] Date of Birth: 12-04-1950  Referring: No ref. provider found Primary Care: [name], MD Primary Cardiologist:No primary care provider on file.  Chief Complaint: Patient with right Pleurx catheter in for pleural effusion, presents to the ER anemic Cancer Staging No matching staging information was found for the patient.   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  The patient was discharged home with a hemoglobin 8.5 returns yesterday to the emergency room feeling weak.  Daughter notes that the catheter was being drained every other day, 1 day was greater than 1000 mL 2 days later 836ml, bloody effusion as it was on the initial drainage and presentation.  Patient notes frequent cough  Chest x-ray done in the emergency room appears stable from previous ones On return the emergency room the patient's hemoglobin had dropped to 6.5 and she was transfused 2 units of packed red blood cells  She denies any history of blood in her stool, she also has a bladder mass.  Current Activity/ Functional Status: Patient is not independent with mobility/ambulation, transfers, ADL's, IADL's.   Zubrod Score: At the time of surgery this patient's most appropriate activity status/level should be described as: []     0    Normal activity, no symptoms []     1    Restricted in physical strenuous activity but ambulatory, able to do out light work [x]     2    Ambulatory and capable of self care, unable to do work activities, up and about                 more than 50%  Of the time                            []     3    Only limited self care, in bed greater than 50% of waking hours []      4    Completely disabled, no self care, confined to bed or chair []     5    Moribund  Past Medical History:  Diagnosis Date  . Anxiety   . Arthritis    osteoarthritis.  . Cancer ([place])    lung  . Depression   . Hypertension     Past Surgical History:  Procedure Laterality Date  . CHEST TUBE INSERTION Right 04/01/2019   Procedure: INSERTION PLEURAL DRAINAGE CATHETER;  Surgeon: [name], MD;  Location: [facility];  Service: Thoracic;  Laterality: Right;  . TONSILLECTOMY      Social History   Tobacco Use  Smoking Status Former Smoker  . Packs/day: 0.25  . Types: Cigarettes  . Quit date: 04/14/2014  . Years since quitting: 5.0  Smokeless Tobacco Never Used    Social History   Substance and Sexual Activity  Alcohol Use Not Currently  . Alcohol/week: 21.0 standard drinks  . Types: 21 Glasses of wine per week   Comment: socially wine     Allergies  Allergen Reactions  . Trazodone And Nefazodone Other (See Comments)  Made feel funny    Current Facility-Administered Medications  Medication Dose Route Frequency Provider Last Rate Last Dose  . 0.9 %  sodium chloride infusion (Manually program via Guardrails IV Fluids)   Intravenous Once Asencion Noble, MD      . albuterol (PROVENTIL) (2.5 MG/3ML) 0.083% nebulizer solution 2.5 mg  2.5 mg Inhalation Q6H PRN Asencion Noble, MD   2.5 mg at 04/24/19 1202  . benzonatate (TESSALON) capsule 100 mg  100 mg Oral TID PRN Asencion Noble, MD      . buPROPion (WELLBUTRIN XL) 24 hr tablet 300 mg  300 mg Oral Daily Lonia Skinner M, MD   300 mg at 04/24/19 1157  . loratadine (CLARITIN) tablet 10 mg  10 mg Oral Daily Asencion Noble, MD   10 mg at 04/24/19 1157  . mirtazapine (REMERON) tablet 15 mg  15 mg Oral QHS Asencion Noble, MD      . morphine 10 MG/5ML solution 2.5 mg  2.5 mg Oral Q8H PRN Asencion Noble, MD       Current Outpatient Medications  Medication Sig Dispense Refill  . acetaminophen (TYLENOL)  500 MG tablet Take 1 tablet (500 mg total) by mouth every 6 (six) hours as needed for moderate pain or headache. (Patient taking differently: Take 1,000 mg by mouth every 6 (six) hours as needed for moderate pain or headache. ) 30 tablet 1  . albuterol (VENTOLIN HFA) 108 (90 Base) MCG/ACT inhaler Inhale 1-2 puffs into the lungs every 6 (six) hours as needed for wheezing or shortness of breath. 8 g 1  . buPROPion (WELLBUTRIN XL) 300 MG 24 hr tablet TAKE 1 TABLET (300 MG TOTAL) BY MOUTH DAILY. 30 tablet 2  . cetirizine (ZYRTEC) 10 MG tablet Take 1 tablet (10 mg total) by mouth daily. (Patient taking differently: Take 10 mg by mouth daily as needed for allergies. ) 30 tablet 1  . fluticasone (FLONASE) 50 MCG/ACT nasal spray Place 2 sprays into both nostrils daily. (Patient taking differently: Place 2 sprays into both nostrils daily as needed for rhinitis. ) 16 g 1  . hydrOXYzine (ATARAX/VISTARIL) 25 MG tablet Take 1 tablet (25 mg total) by mouth 3 (three) times daily as needed. (Patient taking differently: Take 25 mg by mouth 3 (three) times daily as needed for itching. ) 90 tablet 0  . Lavender Oil OIL Apply 1 application topically 2 (two) times daily as needed (knee pain).    . methotrexate (RHEUMATREX) 2.5 MG tablet Take 25 mg by mouth once a week. Take one tablet by mouth once a week on fridays.  Caution:Chemotherapy. Protect from light.    . mirtazapine (REMERON) 15 MG tablet Take 1 tablet (15 mg total) by mouth at bedtime. 30 tablet 3  . morphine 10 MG/5ML solution Take 1.3 mLs (2.6 mg total) by mouth every 8 (eight) hours as needed (air hunger (difficulty breathing) and anxiety). 30 mL 0  . prednisoLONE acetate (PRED FORTE) 1 % ophthalmic suspension Place 1 drop into the left eye 4 (four) times daily.    . benzonatate (TESSALON) 100 MG capsule Take 1 capsule (100 mg total) by mouth 3 (three) times daily as needed for cough. (Patient not taking: Reported on 04/24/2019) 30 capsule 0  . traZODone  (DESYREL) 50 MG tablet Take 0.5-1 tablets (25-50 mg total) by mouth at bedtime as needed for sleep. (Patient not taking: Reported on 04/24/2019) 30 tablet 3      Family History  Problem  Relation Age of Onset  . Hypertension Mother   . Rheum arthritis Mother   . Other Father        suicide  . Diabetes Paternal Grandmother   . Diabetes Paternal Grandfather      Review of Systems:      Cardiac Review of Systems: Y or  [    ]= no  Chest Pain [    n]  Resting SOB [ y  ] Exertional SOB  [ y ]  Orthopnea [  y]   Pedal Edema [n   ]    Palpitations [ n ] Syncope  [ n ]   Presyncope [ n  ]  General Review of Systems: [Y] = yes [  ]=no Constitional: recent weight change [  ]; anorexia [ y ]; fatigue [ y ]; nausea [  ]; night sweats [  ]; fever [  ]; or chills [  ]                                                               Dental: Last Dentist visit:   Eye : blurred vision [  ]; diplopia [   ]; vision changes [  ];  Amaurosis fugax[  ]; Resp: cough [  ];  wheezing[  ];  hemoptysis[  ]; shortness of breath[  ]; paroxysmal nocturnal dyspnea[  ]; dyspnea on exertion[  ]; or orthopnea[  ];  GI:  gallstones[  ], vomiting[  ];  dysphagia[  ]; melena[  ];  hematochezia [  ]; heartburn[  ];   Hx of  Colonoscopy[  ]; GU: kidney stones [  ]; hematuria[  ];   dysuria [  ];  nocturia[  ];  history of     obstruction [  ]; urinary frequency [  ]             Skin: rash, swelling[  ];, hair loss[  ];  peripheral edema[  ];  or itching[  ]; Musculosketetal: myalgias[  ];  joint swelling[  ];  joint erythema[  ];  joint pain[  ];  back pain[  ];  Heme/Lymph: bruising[  ];  bleeding[  ];  anemia[  ];  Neuro: TIA[  ];  headaches[  ];  stroke[  ];  vertigo[  ];  seizures[  ];   paresthesias[  ];  difficulty walking[  ];  Psych:depression[  ]; anxiety[  ];  Endocrine: diabetes[  ];  thyroid dysfunction[  ];              Physical Exam: BP 104/73   Pulse 80   Temp 98.3 F (36.8 C) (Oral)   Resp 20   Ht  5\' 3"  (1.6 m)   Wt 59 kg   SpO2 100%   BMI 23.03 kg/m    General appearance: alert, cooperative and appears older than stated age Neck: no adenopathy, no carotid bruit, no JVD, supple, symmetrical, trachea midline and thyroid not enlarged, symmetric, no tenderness/mass/nodules Lymph nodes: Cervical, supraclavicular, and axillary nodes normal. Resp: diminished breath sounds RLL and RML Cardio: regular rate and rhythm, S1, S2 normal, no murmur, click, rub or gallop GI: soft, non-tender; bowel sounds normal; no masses,  no organomegaly Extremities: extremities normal, atraumatic, no cyanosis or  edema and Homans sign is negative, no sign of DVT Neurologic: Grossly normal  Diagnostic Studies & Laboratory data:     Recent Radiology Findings:   Dg Chest 2 View  Result Date: 04/24/2019 CLINICAL DATA:  68 year old female with shortness of breath. EXAM: CHEST - 2 VIEW COMPARISON:  Chest radiograph dated 04/15/2019 and CT dated 04/15/2019 FINDINGS: A right-sided chest tube appears to have been advanced with tip now over the posterior right upper lobe pleural surface. Loculated pleural effusion and right apical pleural based loculated effusion or masses as well as right hilar mass. Overall no significant interval change aeration of the right lung since the radiograph of 04/25/2019. The left lung remains clear. No pneumothorax. Stable cardiomediastinal silhouette. No acute osseous pathology. IMPRESSION: No significant interval change in the appearance of the loculated right pleural effusion and right hilar mass. No pneumothorax. Electronically Signed   By: Anner Crete M.D.   On: 04/24/2019 00:09     I have independently reviewed the above radiologic studies and discussed with the patient   Recent Lab Findings: Lab Results  Component Value Date   WBC 17.8 (H) 04/23/2019   HGB 9.6 (L) 04/24/2019   HCT 30.5 (L) 04/24/2019   PLT 420 (H) 04/23/2019   GLUCOSE 131 (H) 04/23/2019   CHOL 166  02/10/2017   TRIG 68 02/10/2017   HDL 84 02/10/2017   LDLDIRECT 51 04/15/2010   LDLCALC 68 02/10/2017   ALT 20 04/23/2019   AST 25 04/23/2019   NA 135 04/23/2019   K 4.4 04/23/2019   CL 103 04/23/2019   CREATININE 0.99 04/23/2019   BUN 13 04/23/2019   CO2 21 (L) 04/23/2019   TSH 0.640 12/11/2017   INR 1.1 04/01/2019   HGBA1C 5.4 02/10/2017      Assessment / Plan:   1/Bloody pericardial effusion related to pleural involvement with adenocarcinoma right pleural space-chest x-ray appears stable would recommend follow-up CT scan to fully evaluate.  Would increase the drainage of the Pleurx catheter to daily with the volume removed  Avoid any anticoagulation at this point until the source of blood loss is identified      Grace Isaac MD      [address] [place] [address] Office [phone]   [place]  04/24/2019 4:07 PM

## 2019-04-24 NOTE — ED Notes (Signed)
Pt has wheezing cough upon exertion to use bedside commode.

## 2019-04-24 NOTE — H&P (Addendum)
Date: 04/24/2019               Patient Name:  Kristen Chavez MRN: [id number]  DOB: 02-28-1951 Age / Sex: 68 y.o., female   PCP: [name], MD         Medical Service: Internal Medicine Teaching Service         Attending Physician: Dr. Lynnae January, Real Cons, MD    First Contact: Charleen Kirks, MD, Albertine Grates Pager: IB [phone])  Second Contact: Myrtie Hawk, MD, [place] Pager: EM [phone])       After Hours (After 5p/  First Contact Pager: [phone]  weekends / holidays): Second Contact Pager: [phone]   Chief Complaint: abnormal pleurix drainage and fatigue  History of Present Illness: 68 y.o. yo female w/ PMHx significant for metastatic lung cancer with malignant pleural effusion, hypertension, depression and anxiety presenting with concerns of abnormal pleural catheter drainage. History obtained by patient and chart review. Patient reports she has had progressively worsening fatigue and dizziness, especially on standing, since discharge from the hospital. She has had associated dyspnea, especially on exertion which has been slightly improved with the morphine she was discharged with. She also reports a productive cough with whitish-pinkish phlegm. She is unsure if this is worse from her baseline. She notes increased amount of drainage from her Pleurx.Today, her daughter drained her pleurx and noted to have large blood clots in the fluid. They called Dr. Pollie Meyer office and was instructed to return to the hospital. Patient also endorses nausea since discharge with decreased appetite and fluid intake. She had one episode of emesis today that appeared to be pink. Patient denies any fevers, chills, headaches, vision changes, chest pain, abdominal pain, diarrhea, constipation, urinary symptoms or any focal weakness.  Of note, patient was recently admitted for symptomatic anemia requiring transfusion and fluid resuscitation for hypotension. Patient was seen by oncologist during hospitalization and  palliative care team for goals of care discussion. Patient stabilized for discharge to home with outpatient follow up with oncology, cardiothoracic surgery, urology and [facility]. Patient reports that she saw urologist today and there is plan for surgical removal of the 2cm mass in her bladder noted on prior CT Abdomen/Pelvis. Patient has not been able to follow up with oncology or CVTS or [facility] since discharge.   ED course:  Patient presented with generalized fatigue and concern of abnormal pleural catheter output. Hb 6.4 (down from 8.5 on discharge) and hypotensive to SBP 80-90s. Patient is otherwise hemodynamically stable and no change in mental status.  ED physician ordered 2u pRBC. Patient to be admitted for further evaluation and management.   Meds:  Current Meds  Medication Sig  . acetaminophen (TYLENOL) 500 MG tablet Take 1 tablet (500 mg total) by mouth every 6 (six) hours as needed for moderate pain or headache. (Patient taking differently: Take 1,000 mg by mouth every 6 (six) hours as needed for moderate pain or headache. )  . albuterol (VENTOLIN HFA) 108 (90 Base) MCG/ACT inhaler Inhale 1-2 puffs into the lungs every 6 (six) hours as needed for wheezing or shortness of breath.  Marland Kitchen buPROPion (WELLBUTRIN XL) 300 MG 24 hr tablet TAKE 1 TABLET (300 MG TOTAL) BY MOUTH DAILY.  . cetirizine (ZYRTEC) 10 MG tablet Take 1 tablet (10 mg total) by mouth daily. (Patient taking differently: Take 10 mg by mouth daily as needed for allergies. )  . fluticasone (FLONASE) 50 MCG/ACT nasal spray Place 2 sprays into both nostrils daily. (Patient taking differently: Place  2 sprays into both nostrils daily as needed for rhinitis. )  . hydrOXYzine (ATARAX/VISTARIL) 25 MG tablet Take 1 tablet (25 mg total) by mouth 3 (three) times daily as needed. (Patient taking differently: Take 25 mg by mouth 3 (three) times daily as needed for itching. )  . Lavender Oil OIL Apply 1 application topically 2 (two) times daily as needed  (knee pain).  . methotrexate (RHEUMATREX) 2.5 MG tablet Take 25 mg by mouth once a week. Take one tablet by mouth once a week on fridays.  Caution:Chemotherapy. Protect from light.  . mirtazapine (REMERON) 15 MG tablet Take 1 tablet (15 mg total) by mouth at bedtime.  Marland Kitchen morphine 10 MG/5ML solution Take 1.3 mLs (2.6 mg total) by mouth every 8 (eight) hours as needed (air hunger (difficulty breathing) and anxiety).  . prednisoLONE acetate (PRED FORTE) 1 % ophthalmic suspension Place 1 drop into the left eye 4 (four) times daily.     Allergies: Allergies as of 04/23/2019  . (No Known Allergies)   Past Medical History:  Diagnosis Date  . Anxiety   . Arthritis    osteoarthritis.  . Cancer ([place])    lung  . Depression   . Hypertension     Family History:  Family History  Problem Relation Age of Onset  . Hypertension Mother   . Rheum arthritis Mother   . Other Father        suicide  . Diabetes Paternal Grandmother   . Diabetes Paternal Grandfather      Social History:  Social History   Tobacco Use  . Smoking status: Former Smoker    Packs/day: 0.25    Types: Cigarettes    Quit date: 04/14/2014    Years since quitting: 5.0  . Smokeless tobacco: Never Used  Substance Use Topics  . Alcohol use: Not Currently    Alcohol/week: 21.0 standard drinks    Types: 21 Glasses of wine per week    Comment: socially wine  . Drug use: Not Currently    Types: Marijuana     Review of Systems: A complete ROS was negative except as per HPI.   Physical Exam: Blood pressure (!) 87/61, pulse 87, temperature 98.2 F (36.8 C), resp. rate 15, height 5\' 3"  (1.6 m), weight 59 kg, SpO2 100 %. Physical Exam Vitals signs reviewed.  Constitutional:      General: She is not in acute distress.    Appearance: She is ill-appearing. She is not diaphoretic.     Comments: Patient is a cachectic appearing African American female; tearful during interview.   HENT:     Head: Normocephalic and  atraumatic.     Mouth/Throat:     Mouth: Mucous membranes are moist.     Pharynx: Oropharynx is clear. No posterior oropharyngeal erythema.  Neck:     Musculoskeletal: Normal range of motion.  Cardiovascular:     Rate and Rhythm: Normal rate and regular rhythm.     Pulses: Normal pulses.     Heart sounds: Normal heart sounds. No murmur. No friction rub. No gallop.   Pulmonary:     Effort: Pulmonary effort is normal. No respiratory distress.     Breath sounds: Rales present. No wheezing.     Comments: Decreased breath sounds at right base; diffuse crackles on remainder of lung exam Abdominal:     General: Bowel sounds are normal. There is no distension.     Palpations: Abdomen is soft.     Tenderness: There is  no abdominal tenderness. There is no guarding.     Hernia: No hernia is present.  Musculoskeletal: Normal range of motion.        General: No swelling, tenderness or deformity.  Skin:    General: Skin is warm and dry.     Capillary Refill: Capillary refill takes less than 2 seconds.  Neurological:     General: No focal deficit present.     Mental Status: She is alert and oriented to person, place, and time. Mental status is at baseline.     Cranial Nerves: No cranial nerve deficit.     Motor: No weakness.     Gait: Gait normal.    EKG: personally reviewed my interpretation is normal sinus rhythm, no significant changes from prior EKG.  CXR: personally reviewed my interpretation is no significant changes in loculated right pleural effusion and right hilar mass.    Assessment & Plan by Problem: Active Problems:   Symptomatic anemia  Patient is 68yo female with recent diagnosis of metastatic lung adenocarcinoma with pleurx in place presenting with progressively worsening fatigue and dizziness and abnormal pleurx output today consistent with symptomatic anemia.   Symptomatic anemia:  Patient with progressively worsening fatigue and dizziness since discharge. She has had  increased dyspnea on exertion and decreased PO intake. She also noted to have increased drainage into her pleurx since discharge with a large blood clot noted by daughter today.  Instructed by on-call oncologist to present to ED. On arrival, patient hypotensive and found to have Hb 6.4 (8.5 on discharge). On exam, patient is cachectic but does not appear to be in acute distress. 2u pRBC ordered by ED physician.  - Post transfusion H/H - Cardiac monitoring - Consult oncology in AM   Metastatic lung cancer to pleural space:  Ms. Bucks has a recent diagnosis of metastatic lung cancer. She has a pleurx in place for drainage, of which she has noted increased amount of bloody drainage with a large blood clot earlier today. She was seen by Dr.Kale, oncologist, on previous admission and plan was to start process of initiating treatment. However, patient has not been able to schedule a follow up with them yet. She contacted Dr. Grier Mitts office today for concerns of this blood clot in her pleurx and was advised to present tot he ED. Patient does note a cough with productive sputum that appears to be white/pink in color. However, she is unsure if this is unchanged from baseline. Lung auscultation significant for decreased breath sounds at right base with crackles in remainder of lung fields.However, no significant respiratory distress noted.  - Continue morphine q8h prn for air hunger - Albuterol neb q6h prn  - Benzonatate tid prn cough   Hypotension: Patient hypotensive on admission SBP 80s-90s. This was an issue on her previous admission as well, requiring resuscitation. Patient given 1L bolus in the ED with stabilization in BP.  - Holding home BP meds - Continue to monitor - Fluid resuscitation   Hx of depression and anxiety:  Patient with depression/anxiety and having difficulty in coping with her relatively new diagnosis of metastatic lung cancer. On discharge, there was planning for her to see Miquel Dunn in  [facility]. However, patient has not been able to follow up with that yet. On exam today, patient is tearful. However, she is able to be consoled.  - Continue home wellbutrin and mirtazapine   Diet: NPO Code: Full VTE Prophylaxis: SCDs  Dispo: Admit patient to Inpatient with expected length of  stay greater than 2 midnights.  SignedHarvie Heck, MD 04/24/2019, 5:07 AM  Pager: [phone]

## 2019-04-24 NOTE — ED Notes (Signed)
Pt returned from CT °

## 2019-04-24 NOTE — Progress Notes (Signed)
adenocarcinoma

## 2019-04-24 NOTE — Progress Notes (Signed)
HEMATOLOGY-ONCOLOGY PROGRESS NOTE  SUBJECTIVE: Kristen Chavez was seen today in the ER.  Thre patient came back in for evaluation in the emergency room due to bloody drainage from her Pleurx catheter.  The patient states that her daughter has been draining her Pleurx catheter every other day with bloody drainage of approximately 1 L.  She stated that on the day of admission, he started to feel lightheaded, felt clammy, and developed nausea and vomiting.  She is also having ongoing shortness of breath.  On admission, she was found to have a hemoglobin of 6.4 with an elevated white blood count of 17.8.  Albumin is low at 1.9.  Chest x-ray showed no significant interval change in the appearance of the loculated right pleural effusion and right hilar mass, no pneumothorax.  Today, the patient reports that she feels somewhat better after receiving 2 units of packed red blood cells.  Hemoglobin is improved to 9.6.  She denies chest discomfort today but reports ongoing shortness of breath.  Her nausea and vomiting have resolved.  She has not noticed any bleeding other than the blood in her pleural fluid.  She reports ongoing anorexia and weight loss.  REVIEW OF SYSTEMS:   Constitutional: Denies fevers and chills.  Reports ongoing weight loss and fatigue. Respiratory: Reports shortness of breath and cough.  No hemoptysis. Cardiovascular: Denies palpitation, chest discomfort Gastrointestinal: Nausea and vomiting have resolved. Skin: Denies abnormal skin rashes Lymphatics: Denies new lymphadenopathy or easy bruising Neurological:Denies numbness, tingling or new weaknesses Behavioral/Psych: Mood is stable, no new changes  Extremities: No lower extremity edema All other systems were reviewed with the patient and are negative.  I have reviewed the past medical history, past surgical history, social history and family history with the patient and they are unchanged from previous note.   PHYSICAL EXAMINATION: ECOG  PERFORMANCE STATUS: 3 - Symptomatic, >50% confined to bed  Vitals:   04/24/19 1341 04/24/19 1430  BP: 103/81 104/73  Pulse: 80   Resp: (!) 21 20  Temp: 98.3 F (36.8 C)   SpO2: 100%    Filed Weights   04/23/19 2245  Weight: 130 lb (59 kg)    Intake/Output from previous day: 10/13 0701 - 10/14 0700 In: 1315 [Blood:315; IV Piggyback:1000] Out: -   GENERAL:alert, no distress and comfortable SKIN: skin color, texture, turgor are normal, no rashes or significant lesions EYES: normal, Conjunctiva are pink and non-injected, sclera clear OROPHARYNX:no exudate, no erythema and lips, buccal mucosa, and tongue normal  NECK: supple, thyroid normal size, non-tender, without nodularity LYMPH:  no palpable lymphadenopathy in the cervical, axillary or inguinal LUNGS: Diminished breath sounds on the right.  Right Pleurx catheter in place with bloody drainage noted in the tubing. HEART: regular rate & rhythm and no murmurs and no lower extremity edema ABDOMEN:abdomen soft, non-tender and normal bowel sounds Musculoskeletal:no cyanosis of digits and no clubbing  NEURO: alert & oriented x 3 with fluent speech, no focal motor/sensory deficits  LABORATORY DATA:  I have reviewed the data as listed CMP Latest Ref Rng & Units 04/23/2019 04/18/2019 04/17/2019  Glucose 70 - 99 mg/dL 131(H) 89 89  BUN 8 - 23 mg/dL 13 5(L) 6(L)  Creatinine 0.44 - 1.00 mg/dL 0.99 0.88 0.77  Sodium 135 - 145 mmol/L 135 140 140  Potassium 3.5 - 5.1 mmol/L 4.4 3.9 3.6  Chloride 98 - 111 mmol/L 103 111 114(H)  CO2 22 - 32 mmol/L 21(L) 20(L) 20(L)  Calcium 8.9 - 10.3 mg/dL 8.0(L) 8.0(L)  7.8(L)  Total Protein 6.5 - 8.1 g/dL 5.1(L) - 4.6(L)  Total Bilirubin 0.3 - 1.2 mg/dL 0.4 - 1.0  Alkaline Phos 38 - 126 U/L 92 - 68  AST 15 - 41 U/L 25 - 15  ALT 0 - 44 U/L 20 - 17    Lab Results  Component Value Date   WBC 17.8 (H) 04/23/2019   HGB 9.6 (L) 04/24/2019   HCT 30.5 (L) 04/24/2019   MCV 83.9 04/23/2019   PLT 420 (H)  04/23/2019   NEUTROABS 14.0 (H) 04/23/2019    Dg Chest 2 View  Result Date: 04/24/2019 CLINICAL DATA:  68 year old female with shortness of breath. EXAM: CHEST - 2 VIEW COMPARISON:  Chest radiograph dated 04/15/2019 and CT dated 04/15/2019 FINDINGS: A right-sided chest tube appears to have been advanced with tip now over the posterior right upper lobe pleural surface. Loculated pleural effusion and right apical pleural based loculated effusion or masses as well as right hilar mass. Overall no significant interval change aeration of the right lung since the radiograph of 04/25/2019. The left lung remains clear. No pneumothorax. Stable cardiomediastinal silhouette. No acute osseous pathology. IMPRESSION: No significant interval change in the appearance of the loculated right pleural effusion and right hilar mass. No pneumothorax. Electronically Signed   By: Anner Crete M.D.   On: 04/24/2019 00:09   Dg Chest 2 View  Result Date: 04/15/2019 CLINICAL DATA:  Chest pain and dizziness. Lung carcinoma. EXAM: CHEST - 2 VIEW COMPARISON:  04/02/2019 FINDINGS: Heart size remains normal. Left chest tube remains in place. Moderate right pleural effusion is increased in size since previous study. No pneumothorax visualized. Multiple base masses are again seen in the right lung apex, without significant change. Mediastinal and right hilar lymphadenopathy remains stable. Left lung remains clear. IMPRESSION: Moderate right pleural effusion, increased in size since prior study. Right chest tube remains in place. Multiple pleural base masses in right lung apex, without significant change. No significant change in mediastinal and right hilar lymphadenopathy. Electronically Signed   By: Marlaine Hind M.D.   On: 04/15/2019 13:18   Dg Chest 2 View  Result Date: 03/26/2019 CLINICAL DATA:  Right-sided chest pain and shortness of breath EXAM: CHEST - 2 VIEW COMPARISON:  10/28/2017 FINDINGS: Cardiac shadow is within normal  limits. Right-sided pleural effusion and basilar consolidation is noted. Significant increased density is noted in the right paratracheal region suspicious for lymphadenopathy. Changes are suspicious for underlying neoplasm. Pleural based mass lesions in the right apex are noted as well. IMPRESSION: Changes suspicious for right-sided pulmonary neoplasm with pleural base metastatic disease and mediastinal adenopathy. Large right pleural effusion is noted with likely underlying atelectasis. CT of the chest is recommended with contrast for further evaluation. Electronically Signed   By: Inez Catalina M.D.   On: 03/26/2019 23:03   Ct Head W & Wo Contrast  Result Date: 03/28/2019 CLINICAL DATA:  Lung mass. Rule out intracranial metastases. EXAM: CT HEAD WITHOUT AND WITH CONTRAST TECHNIQUE: Contiguous axial images were obtained from the base of the skull through the vertex without and with intravenous contrast CONTRAST:  167mL OMNIPAQUE IOHEXOL 300 MG/ML  SOLN COMPARISON:  None. FINDINGS: Brain: There is no evidence of acute infarct, intracranial hemorrhage, mass, midline shift, or extra-axial fluid collection. The ventricles and sulci are normal. No abnormal enhancement is identified. Vascular: Calcified atherosclerosis at the skull base. Major dural venous sinuses and large arteries at the base of the brain are patent. Skull: No fracture or focal  osseous lesion. Sinuses/Orbits: Visualized paranasal sinuses and mastoid air cells are clear. Left cataract extraction is noted. Other: None. IMPRESSION: Unremarkable CT appearance of the brain. No evidence of intracranial metastatic disease. Electronically Signed   By: Logan Bores M.D.   On: 03/28/2019 13:37   Ct Chest W Contrast  Result Date: 03/27/2019 CLINICAL DATA:  Pleural effusion.  Increasing shortness of breath EXAM: CT CHEST WITH CONTRAST TECHNIQUE: Multidetector CT imaging of the chest was performed during intravenous contrast administration. CONTRAST:  80mL  OMNIPAQUE IOHEXOL 300 MG/ML  SOLN COMPARISON:  Chest x-ray from earlier today FINDINGS: Cardiovascular: No cardiomegaly. No pericardial effusion. Malignant distortion/narrowing of right-sided pulmonary arteries. No acute vascular finding. Mediastinum/Nodes: Right upper lobe mass encasing the central vessels with heterogeneous central enhancement attributed to necrosis. Thoracic adenopathy at the right hilum, subcarinal station, paratracheal station, and AP window, and left pulmonary hilum. Confluent right hilar nodal mass measures up to 4.3 cm on axial slices. Lungs/Pleura: Right upper lobe mass which is branching and blends with the hilar mass, limiting reproducible measurement. There is a large right pleural effusion with multifocal pleural nodularity. No left lung metastasis. No suspected superimposed edema or pneumonia. Upper Abdomen: No acute finding or evident metastasis. Musculoskeletal: Advanced thoracic spine degeneration with multilevel osteitis. No acute or aggressive finding. 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 The right pleural effusion is large volume. Electronically Signed   By: Monte Fantasia M.D.   On: 03/27/2019 09:37   Ct Angio Chest Pe W And/or Wo Contrast  Result Date: 04/15/2019 CLINICAL DATA:  68 year old female with acute shortness of breath for several weeks. Recent diagnosis of stage IV lung cancer. EXAM: CT ANGIOGRAPHY CHEST WITH CONTRAST TECHNIQUE: Multidetector CT imaging of the chest was performed using the standard protocol during bolus administration of intravenous contrast. Multiplanar CT image reconstructions and MIPs were obtained to evaluate the vascular anatomy. CONTRAST:  12mL OMNIPAQUE IOHEXOL 300 MG/ML  SOLN COMPARISON:  03/27/2019 CT and prior studies. FINDINGS: Cardiovascular: This is a technically satisfactory study. No pulmonary emboli are identified. Heart size is  normal. No thoracic aortic aneurysm or pericardial effusion. Mediastinum/Nodes: Bilateral mediastinal and hilar lymphadenopathy are unchanged with the index RIGHT hilar node measuring 3.6 x 4.3 cm (series 7: Image 63). No new mediastinal abnormalities are identified. Lungs/Pleura: Confluent and nodular masses within the MEDIAL RIGHT UPPER lobe again identified and unchanged with approximate measurements of 5.3 x 7.8 x 6.7 cm. This mass is again noted to case the central RIGHT pulmonary vessels. There is been interval placement of a RIGHT pleural catheter with decrease in loculated RIGHT pleural effusion but still moderate to large. Pleural nodules are again identified. Associated atelectasis within the RIGHT lung noted. Interlobular septal thickening throughout the UPPER and mid RIGHT lung noted. A 4 mm LEFT UPPER lobe nodule (8:40) is unchanged. Mild LEFT basilar atelectasis noted. No pneumothorax. Upper Abdomen: No acute abnormality. Musculoskeletal: No acute abnormality. Sclerosis within scattered cervical, thoracic and UPPER lumbar vertebra noted and may be related to degenerative changes but nonspecific. Review of the MIP images confirms the above findings. IMPRESSION: 1. No evidence of pulmonary emboli. 2. Little significant change in RIGHT UPPER lobe mass/masses, bilateral mediastinal and hilar lymphadenopathy and pleural masses compatible with malignancy/metastatic disease. 3. RIGHT pleural catheter in place with decrease in loculated RIGHT pleural effusion since the prior study, but continues to be moderate to large. Electronically Signed   By: Margarette Canada  M.D.   On: 04/15/2019 17:32   Ct Abdomen Pelvis W Contrast  Result Date: 03/28/2019 CLINICAL DATA:  Newly diagnosed right lung carcinoma. Staging. EXAM: CT ABDOMEN AND PELVIS WITH CONTRAST TECHNIQUE: Multidetector CT imaging of the abdomen and pelvis was performed using the standard protocol following bolus administration of intravenous contrast.  CONTRAST:  160mL OMNIPAQUE IOHEXOL 300 MG/ML  SOLN COMPARISON:  Chest CT on 03/27/2019 FINDINGS: Lower Chest: Right pleural effusion with enhancing pleural soft tissue density and bilateral hilar lymphadenopathy, as demonstrated on recent chest CT. Hepatobiliary: No hepatic masses identified. Gallbladder is unremarkable. No evidence of biliary ductal dilatation. Pancreas:  No mass or inflammatory changes. Spleen: Within normal limits in size and appearance. Adrenals/Urinary Tract: No adrenal or renal masses identified. No evidence of hydronephrosis. Enhancing mucosal soft tissue mass is seen in the urinary bladder along the right posterior wall measuring 2.0 x 1.9 cm. This is highly suspicious for primary bladder carcinoma. Stomach/Bowel: No evidence of obstruction, inflammatory process or abnormal fluid collections. Vascular/Lymphatic: No pathologically enlarged lymph nodes. No abdominal aortic aneurysm. Aortic atherosclerosis. Reproductive:  No mass or other significant abnormality. Other:  None. Musculoskeletal: No suspicious bone lesions identified. Severe lumbar spine degenerative disc disease noted. IMPRESSION: No evidence of metastatic lung carcinoma within the abdomen or pelvis. 2 cm mucosal soft tissue mass in the urinary bladder, highly suspicious for primary bladder carcinoma. Recommend urology referral for cystoscopy. Malignant right pleural effusion and lymphadenopathy, as better demonstrated on recent chest CT. Electronically Signed   By: Marlaine Hind M.D.   On: 03/28/2019 12:07   Dg Chest Port 1 View  Result Date: 04/02/2019 CLINICAL DATA:  Pleural effusion, shortness of breath EXAM: PORTABLE CHEST 1 VIEW COMPARISON:  04/01/2019 FINDINGS: Small bore right-sided pleural drainage catheter remains unchanged in positioning. Stable heart size. Bulky right hilar adenopathy. Rounded peripheral opacities at the right lung apex and lateral right chest wall, unchanged. Small right pleural effusion persists.  Bandlike opacity in the periphery of the right lung may represent atelectasis versus loculated pleural fluid within the minor fissure. Increasing hazy opacities within the right mid and lower lung field. Left lung remains clear. No pneumothorax. IMPRESSION: 1. Persistent small right pleural effusion status post pleural drainage catheter placement. No pneumothorax. 2. Increasing opacity within the right lung, atelectasis versus infection. Electronically Signed   By: Davina Poke M.D.   On: 04/02/2019 09:21   Dg Chest Port 1 View  Result Date: 04/01/2019 CLINICAL DATA:  68 year old female with a history of right tunneled pleural tube placement. EXAM: PORTABLE CHEST 1 VIEW COMPARISON:  March 31, 2019 FINDINGS: Cardiomediastinal silhouette unchanged in size and contour. Compared to the prior plain film there has been significant reduction in the right-sided pleural fluid with lentiform opacity at the lateral right chest, and persisting opacity at the right lung apex. Blunting of the right costophrenic angle. Lobulated opacity in the right hilar region likely corresponds to adenopathy on prior CT. Pleural catheter at the left lung base.  No pneumothorax. Reticular opacities throughout the right lung. Asymmetric elevation of the right hemidiaphragm Left lung relatively well aerated with no large left pleural effusion or pneumothorax. IMPRESSION: Interval placement of right-sided pleural catheter with near complete resolution of right-sided pleural effusion, and no pneumothorax. Lobulated soft tissue in the right hilar region likely corresponds to adenopathy on prior CT. Peripheral opacities at the right apex and right lateral lung may represent trapped pleural fluid or soft tissue implants. Electronically Signed   By: Corrie Mckusick  D.O.   On: 04/01/2019 16:00   Dg Chest Port 1 View  Result Date: 03/31/2019 CLINICAL DATA:  Shortness of breath.  Cough. EXAM: PORTABLE CHEST 1 VIEW COMPARISON:  Chest  radiographs, most recent 03/27/2019. Chest CT, 03/27/2019. FINDINGS: There has been interval worsening. There is opacification of most of the right hemithorax with aeration of a portion of the right upper lobe at the apex. Pleural base masses noted at the right apex stable from prior exam. Left lung is hyperexpanded, but clear. No left pleural effusion. No pneumothorax. IMPRESSION: 1. Significant interval worsening. There is opacification of most of the right hemithorax. This is likely a combination of an enlarged pleural effusion with atelectasis and lung masses and nodules. 2. Left lung remains clear. Electronically Signed   By: Lajean Manes M.D.   On: 03/31/2019 07:59   Dg Chest Port 1 View  Result Date: 03/27/2019 CLINICAL DATA:  Status post right-sided thoracentesis EXAM: PORTABLE CHEST 1 VIEW COMPARISON:  03/26/2019 FINDINGS: Heart size is within normal limits. Unchanged appearance of large irregular right hilar mass. Multiple right-sided pleural based masses. Interval decrease in right-sided pleural effusion, trace residual pleural fluid remaining. No pneumothorax. Left lung is clear. IMPRESSION: 1. Interval decrease in a right sided pleural effusion. No pneumothorax. 2. Additional findings are unchanged from prior. Electronically Signed   By: Davina Poke M.D.   On: 03/27/2019 15:59   Dg C-arm 1-60 Min-no Report  Result Date: 04/01/2019 Fluoroscopy was utilized by the requesting physician.  No radiographic interpretation.    ASSESSMENT AND PLAN: Leoma B Rogersis a68 y.o.femalewith:  1. 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 Malignant pleural effusion with rapid reaccumulation s/p indwelling rt sided chest tube 3. 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 Extensive smoking history. 5. Anemia -- likely from blood loss from pleural effusion and Anemia of chronic disease from metastatic malignancy.  PLAN: -goals of care  discussed had with patient, no family at bedside but have discussed with daughter previously.  -recommend evaluation by CT surgery for appropriate positioning of chest tube to optimize drainage of her loculated effusion/minimizing blood loss -Ferritin on 04/19/2019 was 179 with a low percent saturation and iron level.  She is status post Feraheme 510 mg on 04/19/2019.  We can consider repeating x1 which can be done as an outpatient at the cancer center.  Recommend PRBC transfusion for hemoglobin less than 8. -Recommend urology consultation for bladder tumor which can be done as an outpatient. -pending foundation one testing to determine presence of actionable mutation.  -discussed treatment approaches including best supportive care through hospice vs palliative chemotherapy. -patient prefers to consider palliative treatments -will plan for outpatient treatment with carboplatin/Alimta/Pembrolizumab with G-CSF support -if patient has a targetable mutation then will switch to targeted treatment options. -Recommend tessalon perles prn for cough -may consider addition marinol for nausea and to boost appetite -We will plan for outpatient follow-up as soon as she is discharged for consideration of chemotherapy.    LOS: 0 days   Mikey Bussing, DNP, AGPCNP-BC, AOCNP 04/24/19

## 2019-04-24 NOTE — Telephone Encounter (Signed)
Mariann Laster from [facility] called to request an update on outstanding orders for home health. please follow up  [phone] p

## 2019-04-24 NOTE — Telephone Encounter (Signed)
Returned patient's phone call regarding rescheduling an appointment, left a voicemail. 

## 2019-04-24 NOTE — Telephone Encounter (Signed)
Pt admitted to hospital yesterday.

## 2019-04-24 NOTE — ED Notes (Signed)
Oncology at bedside.

## 2019-04-24 NOTE — ED Notes (Signed)
Patient transported to CT 

## 2019-04-24 NOTE — ED Notes (Signed)
Bedside commode provided

## 2019-04-24 NOTE — ED Notes (Signed)
Tegaderm reinforced at plurex site. No discomfort noted at site with frank blood in line.

## 2019-04-24 NOTE — ED Notes (Signed)
Ordered diet tray for pt  

## 2019-04-24 NOTE — ED Notes (Signed)
Kristen Chavez (SR)-Lunch Tray Ordered @1011 .

## 2019-04-24 NOTE — ED Notes (Signed)
Dinner tray ordered.

## 2019-04-24 NOTE — Progress Notes (Signed)
Subjective:   [name] states that she is doing okay this morning but continues to feel dizzy when she sits up or moves around. She denies any dizziness when at rest. States that she has been having increased bloody output from the pleurx since discharge, however yesterday her daughter drained it and saw a large blood clot. This prompted her to come to the ED.   Objective:  Vital signs in last 24 hours: Vitals:   04/24/19 0830 04/24/19 1037 04/24/19 1045 04/24/19 1056  BP: 100/74 112/80 105/77 112/80  Pulse:  97 99 97  Resp: (!) 25 (!) 24 (!) 21 (!) 24  Temp:  98.6 F (37 C)  98.6 F (37 C)  TempSrc:  Oral  Oral  SpO2:   96% 100%  Weight:      Height:       Physical Exam Vitals signs and nursing note reviewed.  Constitutional:      Appearance: She is cachectic.  Cardiovascular:     Rate and Rhythm: Normal rate and regular rhythm.     Heart sounds: No murmur.  Pulmonary:     Effort: No respiratory distress.     Breath sounds: Examination of the right-middle field reveals decreased breath sounds. Examination of the right-lower field reveals decreased breath sounds. Decreased breath sounds present. No wheezing, rhonchi or rales.  Skin:    General: Skin is warm and dry.  Neurological:     Mental Status: She is alert and oriented to person, place, and time. Mental status is at baseline.  Psychiatric:        Mood and Affect: Mood normal.        Behavior: Behavior normal. Behavior is cooperative.    Assessment/Plan:  Active Problems:   Symptomatic anemia  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    # Symptomatic anemia:  Secondary to worsening output from Pleurx. Will contact [facility] in regards finding and possibly controlling bleeding source, as she has needed multiple transfusions in the past few weeks.  After receiving 2 units of pRBCs in the ED, her hemoglobin has increased from 6.4 to 9.6. Will continue to monitor closely with CBC daily  - Cardiac monitoring - TCTS consult - CBC daily  - Transfuse PRN for hemoglobin <7.0   # Adenocarcinoma of the Lung Stage IV # Metastatic cancer to the pleural space  [name] was seen by Dr. Irene Limbo, oncologist, on previous admission and plan was to start process of initiating chemotherapy. She has not had a chance to follow up with him outpatient. Since she called his office in regards to increased pleural bleeding, likely Dr. Irene Limbo is aware she is hospitalized again. Plan to contact him tomorrow.   - Morphine 2.5 mg q8h PRN for air hunger - Albuterol neb q6h PRN - Tessalon Pearls TID PRN for cough  # Hypotension: Patient hypotensive on admission SBP 80s-90s. This was an issue on her previous admission as well, requiring resuscitation. Improved after bolus in the ED.    - Continue to monitor - 1L NS bolus PRN for hypotension  # Hx of depression and anxiety:  Continues to have significant depression and anxiety in light of recent cancer diagnosis. Follow up with Miquel Dunn tomorrow, via tele appointment, so hopefully this can still occur.   - Continue Wellbutrin and Mirtazapine    Dispo: Anticipated discharge pending medical  improvement.  Dr. Jose Persia Internal Medicine PGY-1  Pager: [phone] 04/24/2019, 1:01 PM

## 2019-04-25 ENCOUNTER — Encounter: Payer: Self-pay | Admitting: Licensed Clinical Social Worker

## 2019-04-25 ENCOUNTER — Ambulatory Visit (INDEPENDENT_AMBULATORY_CARE_PROVIDER_SITE_OTHER): Payer: Self-pay | Admitting: Licensed Clinical Social Worker

## 2019-04-25 DIAGNOSIS — C3490 Malignant neoplasm of unspecified part of unspecified bronchus or lung: Secondary | ICD-10-CM

## 2019-04-25 DIAGNOSIS — F331 Major depressive disorder, recurrent, moderate: Secondary | ICD-10-CM

## 2019-04-25 DIAGNOSIS — D649 Anemia, unspecified: Secondary | ICD-10-CM

## 2019-04-25 DIAGNOSIS — R64 Cachexia: Secondary | ICD-10-CM

## 2019-04-25 LAB — TYPE AND SCREEN
ABO/RH(D): O POS
Antibody Screen: NEGATIVE
Unit division: 0
Unit division: 0

## 2019-04-25 LAB — BPAM RBC
Blood Product Expiration Date: 202011102359
Blood Product Expiration Date: 202011172359
ISSUE DATE / TIME: 202010140342
ISSUE DATE / TIME: 202010140653
Unit Type and Rh: 5100
Unit Type and Rh: 5100

## 2019-04-25 LAB — CBC WITH DIFFERENTIAL/PLATELET
Abs Immature Granulocytes: 0.63 10*3/uL — ABNORMAL HIGH (ref 0.00–0.07)
Basophils Absolute: 0.1 10*3/uL (ref 0.0–0.1)
Basophils Relative: 1 %
Eosinophils Absolute: 0.3 10*3/uL (ref 0.0–0.5)
Eosinophils Relative: 3 %
HCT: 27.8 % — ABNORMAL LOW (ref 36.0–46.0)
Hemoglobin: 9.2 g/dL — ABNORMAL LOW (ref 12.0–15.0)
Immature Granulocytes: 5 %
Lymphocytes Relative: 8 %
Lymphs Abs: 1.1 10*3/uL (ref 0.7–4.0)
MCH: 27.6 pg (ref 26.0–34.0)
MCHC: 33.1 g/dL (ref 30.0–36.0)
MCV: 83.5 fL (ref 80.0–100.0)
Monocytes Absolute: 1.5 10*3/uL — ABNORMAL HIGH (ref 0.1–1.0)
Monocytes Relative: 11 %
Neutro Abs: 9.6 10*3/uL — ABNORMAL HIGH (ref 1.7–7.7)
Neutrophils Relative %: 72 %
Platelets: 357 10*3/uL (ref 150–400)
RBC: 3.33 MIL/uL — ABNORMAL LOW (ref 3.87–5.11)
RDW: 16.7 % — ABNORMAL HIGH (ref 11.5–15.5)
WBC: 13.2 10*3/uL — ABNORMAL HIGH (ref 4.0–10.5)
nRBC: 0 % (ref 0.0–0.2)

## 2019-04-25 LAB — FUNGUS CULTURE WITH STAIN

## 2019-04-25 LAB — COMPREHENSIVE METABOLIC PANEL
ALT: 23 U/L (ref 0–44)
AST: 23 U/L (ref 15–41)
Albumin: 1.7 g/dL — ABNORMAL LOW (ref 3.5–5.0)
Alkaline Phosphatase: 92 U/L (ref 38–126)
Anion gap: 8 (ref 5–15)
BUN: 7 mg/dL — ABNORMAL LOW (ref 8–23)
CO2: 20 mmol/L — ABNORMAL LOW (ref 22–32)
Calcium: 8 mg/dL — ABNORMAL LOW (ref 8.9–10.3)
Chloride: 109 mmol/L (ref 98–111)
Creatinine, Ser: 0.88 mg/dL (ref 0.44–1.00)
GFR calc Af Amer: >60 mL/min (ref >60)
GFR calc non Af Amer: >60 mL/min (ref >60)
Glucose, Bld: 84 mg/dL (ref 70–99)
Potassium: 4.1 mmol/L (ref 3.5–5.1)
Sodium: 137 mmol/L (ref 135–145)
Total Bilirubin: 0.4 mg/dL (ref 0.3–1.2)
Total Protein: 5 g/dL — ABNORMAL LOW (ref 6.5–8.1)

## 2019-04-25 LAB — FUNGUS CULTURE RESULT

## 2019-04-25 LAB — FUNGAL ORGANISM REFLEX

## 2019-04-25 MED ORDER — PREDNISOLONE ACETATE 1 % OP SUSP
1.0000 [drp] | Freq: Four times a day (QID) | OPHTHALMIC | Status: DC
  Administered 2019-04-25 – 2019-04-26 (×4): 1 [drp] via OPHTHALMIC
  Filled 2019-04-25: qty 5

## 2019-04-25 MED ORDER — ENSURE ENLIVE PO LIQD
237.0000 mL | Freq: Two times a day (BID) | ORAL | Status: DC
  Administered 2019-04-25 (×2): 237 mL via ORAL

## 2019-04-25 NOTE — Progress Notes (Signed)
[address]       [place] [address]             [phone]                     LOS: 1 day   Subjective: Patient feels better , patient up set when nursing came to drain pleurix with a urinal - she knew this was wrong   Objective: Vital signs in last 24 hours: Patient Vitals for the past 24 hrs:  BP Temp Temp src Pulse Resp SpO2 Height Weight  04/25/19 1638 98/86 98.3 F (36.8 C) Oral (!) 108 - 97 % - -  04/25/19 1107 109/82 98.2 F (36.8 C) Oral (!) 103 - 100 % - -  04/25/19 0837 108/85 97.9 F (36.6 C) Oral (!) 139 - (!) 86 % - -  04/25/19 0120 110/77 98.8 F (37.1 C) Oral (!) 102 20 99 % 5\' 3"  (1.6 m) 60.4 kg    Filed Weights   04/23/19 2245 04/25/19 0120  Weight: 59 kg 60.4 kg    Hemodynamic parameters for last 24 hours:    Intake/Output from previous day: 10/14 0701 - 10/15 0700 In: 945 [P.O.:240; Blood:630] Out: 500 [Urine:500] Intake/Output this shift: Total I/O In: -  Out: 1000 [Chest Tube:1000]  Scheduled Meds: . sodium chloride   Intravenous Once  . buPROPion  300 mg Oral Daily  . feeding supplement (ENSURE ENLIVE)  237 mL Oral BID BM  . loratadine  10 mg Oral Daily  . mirtazapine  15 mg Oral QHS  . prednisoLONE acetate  1 drop Left Eye QID   Continuous Infusions: PRN Meds:.albuterol, benzonatate, morphine CONCENTRATE  General appearance: alert, cooperative and no distress Neurologic: intact Heart: regular rate and rhythm, S1, S2 normal, no murmur, click, rub or gallop Lungs: diminished breath sounds RLL and RML Abdomen: soft, non-tender; bowel sounds normal; no masses,  no organomegaly Extremities: extremities normal, atraumatic, no cyanosis or edema and Homans sign is negative, no sign of DVT  Lab Results: CBC: Recent Labs    04/23/19 2315 04/24/19 1046 04/25/19 0444  WBC 17.8*  --  13.2*  HGB 6.4* 9.6* 9.2*  HCT 20.9* 30.5* 27.8*  PLT 420*  --  357   BMET:  Recent Labs    04/23/19 2315 04/25/19 0444   NA 135 137  K 4.4 4.1  CL 103 109  CO2 21* 20*  GLUCOSE 131* 84  BUN 13 7*  CREATININE 0.99 0.88  CALCIUM 8.0* 8.0*    PT/INR: No results for input(s): LABPROT, INR in the last 72 hours.   Radiology Dg Chest 2 View  Result Date: 04/24/2019 CLINICAL DATA:  68 year old female with shortness of breath. EXAM: CHEST - 2 VIEW COMPARISON:  Chest radiograph dated 04/15/2019 and CT dated 04/15/2019 FINDINGS: A right-sided chest tube appears to have been advanced with tip now over the posterior right upper lobe pleural surface. Loculated pleural effusion and right apical pleural based loculated effusion or masses as well as right hilar mass. Overall no significant interval change aeration of the right lung since the radiograph of 04/25/2019. The left lung remains clear. No pneumothorax. Stable cardiomediastinal silhouette. No acute osseous pathology. IMPRESSION: No significant interval change in the appearance of the loculated right pleural effusion and right hilar mass. No pneumothorax. Electronically Signed   By: Anner Crete M.D.   On: 04/24/2019 00:09   Ct Chest Wo Contrast  Result Date:  04/24/2019 CLINICAL DATA:  68 year old female with pleural effusion. Recent diagnosis of stage IV lung cancer. EXAM: CT CHEST WITHOUT CONTRAST TECHNIQUE: Multidetector CT imaging of the chest was performed following the standard protocol without IV contrast. COMPARISON:  Chest radiograph dated 04/23/2019 and CT dated 04/15/2019 FINDINGS: Evaluation of this exam is limited in the absence of intravenous contrast. Cardiovascular: There is no cardiomegaly or pericardial effusion. The thoracic aorta and central pulmonary arteries are grossly unremarkable on this noncontrast CT. Mediastinum/Nodes: Right hilar/suprahilar mass measures approximately 6.9 x 3.9 cm in greatest axial dimensions in the suprahilar region and 4.6 x 3.7 cm along the right aspect of the upper mediastinum. This is similar to prior CT. No  mediastinal fluid collection. Lungs/Pleura: Right-sided pleural catheter with tip in the posterior right upper lobe pleural surface, slightly more superior compared to the prior CT. No significant interval change in the lobulated pleural based low attenuating masses/collections since the prior CT. These likely represent combination of pleural implants and loculated effusions. There is diffuse interstitial prominence and nodularity in the right upper lobe concerning for metastatic disease and lymphangitic spread of tumor. There is associated mass effect and compressive atelectasis of the majority of the right lower lobe similar to prior CT. There is a 5 mm nodule in the left lower lobe. There is no pneumothorax. There is occlusion of the right upper lobe bronchus by the suprahilar mass. There is compression of the bronchus intermedius and right middle and right lower lobe bronchi. Upper Abdomen: No acute abnormality. Musculoskeletal: Multilevel degenerative changes of the spine. No acute osseous pathology. IMPRESSION: 1. No significant interval change in the size or appearance of the right hilar/suprahilar mass compared to the prior CT. 2. No interval change in the size or appearance of the pleural based masses and loculated collections since the prior CT. 3. Right-sided pleural catheter with tip in the posterior right upper lobe pleural surface, slightly more superior compared to the prior CT. 4. Diffuse interstitial prominence and nodularity in the right upper lobe concerning for metastatic disease and lymphangitic spread of tumor. 5. Stable compressive atelectasis of the majority of the right lower lobe. 6. A 5 mm left lower lobe pulmonary nodule. Electronically Signed   By: Anner Crete M.D.   On: 04/24/2019 18:00   I have independently reviewed the above radiology studies  and reviewed the findings with the patient.   Assessment/Plan: CT scan unchanged  As recommended yesterday continue daily drainage  of pleurix - 850 drained today per the patients daughter who is well versed in drainage technique  There is no plan for surgical intervention Awaiting follow up and decision about oncology treatment   Grace Isaac MD 04/25/2019 6:28 PM     Patient ID: Kristen Chavez, female   DOB: 1951-05-19, 68 y.o.   MRN: [id number]

## 2019-04-25 NOTE — Telephone Encounter (Signed)
Will fax today

## 2019-04-25 NOTE — Plan of Care (Signed)
  Problem: Education: Goal: Knowledge of General Education information will improve Description: Including pain rating scale, medication(s)/side effects and non-pharmacologic comfort measures Outcome: Progressing   Problem: Health Behavior/Discharge Planning: Goal: Ability to manage health-related needs will improve Outcome: Progressing   Problem: Coping: Goal: Level of anxiety will decrease Outcome: Progressing   Problem: Elimination: Goal: Will not experience complications related to bowel motility Outcome: Progressing Goal: Will not experience complications related to urinary retention Outcome: Progressing   Problem: Pain Managment: Goal: General experience of comfort will improve Outcome: Progressing   Problem: Nutrition: Goal: Adequate nutrition will be maintained Outcome: Not Progressing

## 2019-04-25 NOTE — TOC Initial Note (Addendum)
Transition of Care [facility]) - Initial/Assessment Note    Patient Details  Name: Kristen Chavez MRN: [id number] Date of Birth: Jun 27, 1951  [facility]) CM/SW Contact:    Maryclare Labrador, RN Phone Number: 04/25/2019, 12:25 PM  Clinical Narrative:   Unfortunately pt was just discharged home with symptomatic anemia - same dx this admit.  CM confirmed with Alvis Lemmings that they servicing pt including pleurx drain. and will resume HHRN with pleurx drainage occurring M,W,F.  [place] discussions were had during last admission and pt chose to have palliative services offered at Cancer center if she determined that she needed them.   Integrated Behavioral CSW also following while be is hospitalized this admit.  CVTS consulted as blood loss is thought to be from pleurx drain.  TOC will continue to follow                Barriers to Discharge: Continued Medical Work up   Patient Goals and CMS Choice        Expected Discharge Plan and Services         Living arrangements for the past 2 months: Single Family Home                                      Prior Living Arrangements/Services Living arrangements for the past 2 months: Single Family Home Lives with:: Self(Daughter asssit with ADLs if needed)              Current home services: DME, Home PT, Home RN    Activities of Daily Living Home Assistive Devices/Equipment: Hand-held shower hose, Walker (specify type) ADL Screening (condition at time of admission) Patient's cognitive ability adequate to safely complete daily activities?: Yes Is the patient deaf or have difficulty hearing?: No Does the patient have difficulty seeing, even when wearing glasses/contacts?: No Does the patient have difficulty concentrating, remembering, or making decisions?: No Patient able to express need for assistance with ADLs?: Yes Does the patient have difficulty dressing or bathing?: No Independently performs ADLs?: Yes (appropriate for  developmental age) Does the patient have difficulty walking or climbing stairs?: Yes Weakness of Legs: Both Weakness of Arms/Hands: None  Permission Sought/Granted                  Emotional Assessment              Admission diagnosis:  Symptomatic anemia [D64.9] Patient Active Problem List   Diagnosis Date Noted  . Symptomatic anemia 04/24/2019  . Adenocarcinoma of right lung, stage 4 ([place])   . Counseling regarding advance care planning and goals of care   . Malignant pleural effusion   . Anemia 04/15/2019  . Shortness of breath   . DNR (do not resuscitate) discussion   . Palliative care by specialist   . Pleural effusion, malignant   . Adenocarcinoma ([place]) 03/29/2019  . Mass of upper lobe of right lung 03/27/2019  . Pleural effusion   . Corn of toe 12/11/2017  . Weight loss, unintentional 12/11/2017  . Marijuana use 12/11/2017  . Smoking 08/26/2013  . ANXIETY 11/25/2007  . DEPRESSION 11/25/2007  . Essential hypertension 11/25/2007  . Osteoarthritis 11/25/2007  . 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   PCP:  [name], MD Pharmacy:   [facility], [place] [address] [place] [address] Phone: [phone]  Fax: [place], [place] [address] Phone: [phone] Fax: [phone]     Social Determinants of Health (SDOH) Interventions    Readmission Risk Interventions Readmission Risk Prevention Plan 04/19/2019  Transportation Screening Complete  PCP or Specialist Appt within 3-5 Days Complete  HRI or Home Care Consult Complete  Palliative Care Screening Complete  Medication Review (RN Care Manager) Complete  Some recent data might be hidden

## 2019-04-25 NOTE — Progress Notes (Signed)
Subjective:   Kristen Chavez reports that she is doing okay today. She said it took a long time to get moved upstairs from the ED but now that she is in a room, she is more comfortable. Denies any headaches or dizziness.   Discussed plan to talk to TCTS regarding surgical options, if any are available, to stop pleural bleeding. Discussed there is a big chance surgery may not be able to intervene and patient understands. Elaborated that bleeding is from the cancer. Kristen Chavez reports she did not discuss different types of chemotherapy with Dr. Irene Limbo yet; that it is what they planned to discuss at her outpatient follow up.   Objective:  Vital signs in last 24 hours: Vitals:   04/24/19 1341 04/24/19 1430 04/24/19 1605 04/25/19 0120  BP: 103/81 104/73 119/90 110/77  Pulse: 80  (!) 110 (!) 102  Resp: (!) 21 20 (!) 21 20  Temp: 98.3 F (36.8 C)  97.9 F (36.6 C) 98.8 F (37.1 C)  TempSrc: Oral  Oral Oral  SpO2: 100%  96% 99%  Weight:    60.4 kg  Height:    5\' 3"  (1.6 m)   Physical Exam Vitals signs and nursing note reviewed.  Constitutional:      Appearance: She is cachectic.  Cardiovascular:     Rate and Rhythm: Normal rate and regular rhythm.     Heart sounds: No murmur.  Pulmonary:     Effort: No respiratory distress.     Breath sounds: Examination of the right-middle field reveals decreased breath sounds. Examination of the right-lower field reveals decreased breath sounds. Decreased breath sounds present. No wheezing, rhonchi or rales.  Skin:    General: Skin is warm and dry.  Neurological:     Mental Status: She is alert and oriented to person, place, and time. Mental status is at baseline.  Psychiatric:        Mood and Affect: Mood normal.        Behavior: Behavior normal. Behavior is cooperative.    Assessment/Plan:  Active Problems:   Symptomatic anemia  Kristen Chavez is 68 y/o female with recent diagnosis of metastatic lung adenocarcinoma with pleurx in place  presenting with progressively worsening fatigue and dizziness consistent with symptomatic anemia.    # Symptomatic anemia:  Secondary to worsening output from Pleurx. Hemoglobin has stabilized at 9.2 after two units of blood yesterday morning. TCTS was consulted and recommended continuing drainage of pleurx. Will follow up their note today since we have obtained a second CT that showed no change in pleural effusion size and see if she has any surgical options to stop bleeding. Patient is aware that may not be the case. If she wants to continue aggressive treatment, will likely need weekly transfusions in the outpatient setting.   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 Irene Limbo, oncologist, on previous admission and plan was to start process of initiating chemotherapy. She has not had a chance to follow up with him outpatient. Oncology is aware patient was hospitalized. Patient states she has not discussed different types of chemotherapy with Dr. Irene Limbo, although per his past notes, they have talked about it and decided to try initiating chemotherapy. Will need to clarify this further.   - Morphine 2.5 mg  q8h PRN for air hunger - Albuterol neb q6h PRN - Tessalon Pearls TID PRN for cough  # Hypotension: Improved today with BP 110/77.  - Continue to monitor - 1L NS bolus PRN for hypotension <80/50  # Hx of depression and anxiety:  Continues to have significant depression and anxiety in light of recent cancer diagnosis. Patient had another follow up with Miquel Dunn today and their note indicates patient may be coming to terms with the finality of her late stage cancer. Reports wanting to pass in peace and that she will discuss this with her doctors and daughter. Plan to re-discuss goals of care with patient  and consider consulting palliative again if needed.   - Continue Wellbutrin and Mirtazapine  - Discuss goals of care with patient   Dispo: Anticipated discharge pending medical improvement.  Dr. Jose Persia Internal Medicine PGY-1  Pager: [phone] 04/25/2019, 6:50 AM

## 2019-04-25 NOTE — Progress Notes (Signed)
  Date: 04/25/2019  Patient name: Kristen Chavez  Medical record number: [id number]  Date of birth: [birth year]        I have seen and evaluated this patient and I have discussed the plan of care with the house staff. Please see their note for complete details. I concur with their findings with the following additions/corrections: [name] was seen this morning on team rounds.  Our team will discuss with CVTS whether there is any intervention available that can decrease recurrent bleeding into the pleural space to prevent recurrence of symptomatic anemia.  If there is no intervention available, we will need to rediscuss goals of care with [name].  Apparently, [name] with palliative care also works at the [place] center and had indicated she would touch base with [name] during the last hospitalization.  Bartholomew Crews, MD 04/25/2019, 3:09 PM

## 2019-04-25 NOTE — BH Specialist Note (Signed)
Integrated Behavioral Health Visit via Telemedicine (Telephone)  04/25/2019 DERA VANAKEN [id number]   Session Start time: 10:30  Session End time: 10:42 Total time: 12 minutes  Referring Provider: Dr. Charleen Kirks Type of Visit: Telephonic Patient location: Hospital (Admitted) [facility] Provider location: Office All persons participating in visit: [facility] and patient   Confirmed patient's address: Yes  Confirmed patient's phone number: Yes  Any changes to demographics: No   Discussed confidentiality: Yes    The following statements were read to the patient and/or legal guardian that are established with the [facility] Provider.  "The purpose of this phone visit is to provide behavioral health care while limiting exposure to the coronavirus (COVID19).  There is a possibility of technology failure and discussed alternative modes of communication if that failure occurs."  "By engaging in this telephone visit, you consent to the provision of healthcare.  Additionally, you authorize for your insurance to be billed for the services provided during this telephone visit."   Patient and/or legal guardian consented to telephone visit: Yes   PRESENTING CONCERNS: Patient and/or family reports the following symptoms/concerns: anxiety, depression, and health issues. Duration of problem: increased recently due to health challenges; Severity of problem: moderate  GOALS ADDRESSED: Patient will: 1.  Reduce symptoms of: anxiety, depression and stress  2.  Increase knowledge and/or ability of: coping skills, healthy habits and stress reduction  3.  Demonstrate ability to: Increase healthy adjustment to current life circumstances, Increase adequate support systems for patient/family and acceptance of health status.   INTERVENTIONS: Interventions utilized:  Solution-Focused Strategies and Supportive Counseling Standardized Assessments completed: Not Needed  ASSESSMENT: Patient currently  experiencing moderate levels of depression due to learning she has a terminal illness. Patient reported she wants to pass in peace, and plans to communicate this with her doctors. Patient is currently working to improve her communication with her daughter. Patient is planning to discuss her end of life wishes with her daughter today. Patient needed to end the appointment early due to a nurse coming in the room.    Patient may benefit from counseling.  PLAN: 1. Follow up with behavioral health clinician on : one week.   Dessie Coma, [facility], [place]

## 2019-04-26 DIAGNOSIS — Z6823 Body mass index (BMI) 23.0-23.9, adult: Secondary | ICD-10-CM

## 2019-04-26 DIAGNOSIS — Z888 Allergy status to other drugs, medicaments and biological substances status: Secondary | ICD-10-CM

## 2019-04-26 LAB — CBC WITH DIFFERENTIAL/PLATELET
Abs Immature Granulocytes: 0 10*3/uL (ref 0.00–0.07)
Basophils Absolute: 0.1 10*3/uL (ref 0.0–0.1)
Basophils Relative: 1 %
Eosinophils Absolute: 0.5 10*3/uL (ref 0.0–0.5)
Eosinophils Relative: 4 %
HCT: 29.2 % — ABNORMAL LOW (ref 36.0–46.0)
Hemoglobin: 9.1 g/dL — ABNORMAL LOW (ref 12.0–15.0)
Lymphocytes Relative: 4 %
Lymphs Abs: 0.5 10*3/uL — ABNORMAL LOW (ref 0.7–4.0)
MCH: 27 pg (ref 26.0–34.0)
MCHC: 31.2 g/dL (ref 30.0–36.0)
MCV: 86.6 fL (ref 80.0–100.0)
Monocytes Absolute: 0.5 10*3/uL (ref 0.1–1.0)
Monocytes Relative: 4 %
Neutro Abs: 11.8 10*3/uL — ABNORMAL HIGH (ref 1.7–7.7)
Neutrophils Relative %: 87 %
Platelets: 425 10*3/uL — ABNORMAL HIGH (ref 150–400)
RBC: 3.37 MIL/uL — ABNORMAL LOW (ref 3.87–5.11)
RDW: 17.1 % — ABNORMAL HIGH (ref 11.5–15.5)
WBC: 13.6 10*3/uL — ABNORMAL HIGH (ref 4.0–10.5)
nRBC: 0 % (ref 0.0–0.2)
nRBC: 0 /100 WBC

## 2019-04-26 LAB — URINE CULTURE: Culture: >=100000 — AB

## 2019-04-26 MED ORDER — MORPHINE SULFATE 10 MG/5ML PO SOLN: 2.5000 mg | Freq: Three times a day (TID) | ORAL | 0 refills | Status: DC | PRN

## 2019-04-26 MED ORDER — ADULT MULTIVITAMIN W/MINERALS CH
1.0000 | ORAL_TABLET | Freq: Every day | ORAL | Status: DC
  Administered 2019-04-26: 1 via ORAL
  Filled 2019-04-26: qty 1

## 2019-04-26 MED FILL — MORPHINE SULF 10 MG/5 ML SO: 10 | 7 days supply | Qty: 30 | Fill #0

## 2019-04-26 NOTE — TOC Transition Note (Signed)
Transition of Care [facility]) - CM/SW Discharge Note   Patient Details  Name: MEKAYLAH KLICH MRN: [id number] Date of Birth: January 27, 1951  Transition of Care [facility]) CM/SW Contact:  Maryclare Labrador, RN Phone Number: 04/26/2019, 4:14 PM   Clinical Narrative:  Pt is a readmit - pt has CA and suffers from malignant pleural effusions.  PTA with pleurx drain and active with [facility].  CM informed by Alvis Lemmings that they can only provide HHRN twice a week for both drainage of catheter and CBC blood draws.  CM communicated this information to attending and attending in agreement.- attending to modify orders to reflect.   Alvis Lemmings will meet pt in the home this weekend to complete resumption process and then will start draining catheter and CBC draw on next week as orderd.  CM spoke with pts daughter and she confirms that she has been draining the pleurx and continues to feel comfortable to do it for the 3rd time during the week that [facility] does not.      Final next level of care: [place] Barriers to Discharge: Barriers Resolved   Patient Goals and CMS Choice Patient states their goals for this hospitalization and ongoing recovery are:: Pt states she is ready to get back home in her bed CMS Medicare.gov Compare Post Acute Care list provided to:: Patient Choice offered to / list presented to : Patient  Discharge Placement                       Discharge Plan and Services                                  Representative spoke with at [place]: resumption of [facility]  Social Determinants of Health (SDOH) Interventions     Readmission Risk Interventions Readmission Risk Prevention Plan 04/25/2019 04/25/2019 04/19/2019  Transportation Screening - Complete Complete  PCP or Specialist Appt within 3-5 Days - - Complete  HRI or [place] - Complete Complete  Social Work Consult for [place] Planning/Counseling - (No Data) -  Palliative Care Screening Complete (No  Data) Complete  Medication Review Press photographer) - - Complete  Some recent data might be hidden

## 2019-04-26 NOTE — Progress Notes (Signed)
      [address]       ,[place] [address]             [phone]      Subjective:  No new complaints.    Objective: Vital signs in last 24 hours: Temp:  [97.9 F (36.6 C)-98.9 F (37.2 C)] 98.9 F (37.2 C) (10/15 2114) Pulse Rate:  [103-139] 108 (10/15 2114) Cardiac Rhythm: Sinus tachycardia (10/16 0420) BP: (98-109)/(79-86) 99/79 (10/15 2114) SpO2:  [86 %-100 %] 99 % (10/15 2114)  Intake/Output from previous day: 10/15 0701 - 10/16 0700 In: -  Out: 1000 [Chest Tube:1000]  General appearance: alert, cooperative and no distress Heart: regular rate and rhythm Lungs: diminished breath sounds on right Wound: clean and dry  Lab Results: Recent Labs    04/25/19 0444 04/26/19 0614  WBC 13.2* 13.6*  HGB 9.2* 9.1*  HCT 27.8* 29.2*  PLT 357 425*   BMET:  Recent Labs    04/23/19 2315 04/25/19 0444  NA 135 137  K 4.4 4.1  CL 103 109  CO2 21* 20*  GLUCOSE 131* 84  BUN 13 7*  CREATININE 0.99 0.88  CALCIUM 8.0* 8.0*    PT/INR: No results for input(s): LABPROT, INR in the last 72 hours. ABG    Component Value Date/Time   PHART 7.448 04/01/2019 0510   HCO3 24.7 04/01/2019 0510   O2SAT 91.0 04/01/2019 0510   CBG (last 3)  No results for input(s): GLUCAP in the last 72 hours.  Assessment/Plan:  1. Right Malignant Pleural Effusion- Pleurx catheter in place, I personally drained and removed 700 ml of bloody fluid, patient tolerated without significant issue 2. Dispo- patient will need daily drainage of pleurx catheter, there is no surgical intervention available, care per primary   LOS: 2 days    Ellwood Handler 04/26/2019

## 2019-04-26 NOTE — Care Management Important Message (Signed)
Important Message  Patient Details  Name: Kristen Chavez MRN: [id number] Date of Birth: [birth year]   Medicare Important Message Given:  Yes     Orbie Pyo 04/26/2019, 2:04 PM

## 2019-04-26 NOTE — Progress Notes (Signed)
  Date: 04/26/2019  Patient name: MALETA PACHA  Medical record number: [id number]  Date of birth: Apr 01, 1951        I have seen and evaluated this patient and I have discussed the plan of care with the house staff. Please see their note for complete details. I concur with their findings with the following additions/corrections: Ms. Gunnoe was seen this morning on team rounds.  She continues to have bloody Pleurx drainage.  Her hemoglobin remained stable although she will continue to bleed into her pleural space.  CVTS has stated there is no intervention to decrease bleeding.  She has been informed that there is no intervention available and that she will continue to bleed and require transfusions.  We will arrange home health to perform stat CBC every Monday Wednesday Friday while she is a full scope of care.  Home health should call the results to the [facility] triage nurse at [phone].  We will arrange PRBC transfusion in short stay at [facility] as needed for a hemoglobin less than 8 as we will assume that we will continue to trend down and I do not want to wait until she is symptomatic and requires another admission.  Bartholomew Crews, MD 04/26/2019, 3:23 PM

## 2019-04-26 NOTE — Progress Notes (Signed)
  Subjective: R pleurx catheter for malignant effusion CT chest images reviewed Pleurx catheter in good position- drained1000cc cc yesterday Will continue outpatient management of R pleurx cather when patient discharged Patient does not need further surgery for malignant effusion Objective: Vital signs in last 24 hours: Temp:  [98.2 F (36.8 C)-98.9 F (37.2 C)] 98.6 F (37 C) (10/16 0817) Pulse Rate:  [103-108] 107 (10/16 0817) Cardiac Rhythm: Sinus tachycardia (10/16 0729) Resp:  [17] 17 (10/16 0817) BP: (98-109)/(79-89) 102/89 (10/16 0817) SpO2:  [97 %-100 %] 97 % (10/16 0817)  Hemodynamic parameters for last 24 hours:    Intake/Output from previous day: 10/15 0701 - 10/16 0700 In: -  Out: 1000 [Chest Tube:1000] Intake/Output this shift: Total I/O In: -  Out: 700 [Chest Tube:700]    Lab Results: Recent Labs    04/25/19 0444 04/26/19 0614  WBC 13.2* 13.6*  HGB 9.2* 9.1*  HCT 27.8* 29.2*  PLT 357 425*   BMET:  Recent Labs    04/23/19 2315 04/25/19 0444  NA 135 137  K 4.4 4.1  CL 103 109  CO2 21* 20*  GLUCOSE 131* 84  BUN 13 7*  CREATININE 0.99 0.88  CALCIUM 8.0* 8.0*    PT/INR: No results for input(s): LABPROT, INR in the last 72 hours. ABG    Component Value Date/Time   PHART 7.448 04/01/2019 0510   HCO3 24.7 04/01/2019 0510   O2SAT 91.0 04/01/2019 0510   CBG (last 3)  No results for input(s): GLUCAP in the last 72 hours.  Assessment/Plan: S/P  Discharge per [facility] Followup in office will be arranged   LOS: 2 days    Tharon Aquas Trigt III 04/26/2019

## 2019-04-26 NOTE — Progress Notes (Signed)
Initial Nutrition Assessment  INTERVENTION:   -Ensure Enlive po BID, each supplement provides 350 kcal and 20 grams of protein -Multivitamin with minerals daily  NUTRITION DIAGNOSIS:   Increased nutrient needs related to cancer and cancer related treatments as evidenced by estimated needs.  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  Recent admissions: 9/16-9/24, 10/5-10/9  **RD working remotely**  Patient currently awaiting decisions regarding treatment options for met lung cancer. Pt continues with poor appetite like during previous admissions over the past month. Pt has been ordered Ensure supplements but is not drinking them at this time.   Per weight records, pt has lost 10 lbs since June 2019 ,which is not significant for time frame.   Labs reviewed. Medications: Remeron tablet daily   NUTRITION - FOCUSED PHYSICAL EXAM: Unable to perform -working remotely.  Diet Order:   Diet Order            Diet regular Room service appropriate? Yes; Fluid consistency: Thin  Diet effective now              EDUCATION NEEDS:   No education needs have been identified at this time  Skin:  Skin Assessment: Reviewed RN Assessment  Last BM:  10/13  Height:   Ht Readings from Last 1 Encounters:  04/25/19 _0  (1.6 m)    Weight:   Wt Readings from Last 1 Encounters:  04/25/19 60.4 kg    Ideal Body Weight:  52.3 kg  BMI:  Body mass index is 23.59 kg/m.  Estimated Nutritional Needs:   Kcal:  1900-2100  Protein:  90-100g  Fluid:  1.9L/day  Clayton Bibles, MS, RD, LDN Inpatient Clinical  Dietitian Pager: [phone] After Hours Pager: [phone]

## 2019-04-26 NOTE — Discharge Summary (Signed)
Name: Kristen Chavez MRN: [id number] DOB: June 05, 1951 68 y.o. PCP: [name], MD  Date of Admission: 04/23/2019 10:34 PM Date of Discharge: 04/26/2019 Attending Physician: Bartholomew Crews, MD  Discharge Diagnosis: 1. Symptomatic Anemia  2. Hypotension   Discharge Medications: Allergies as of 04/26/2019      Reactions   Trazodone And Nefazodone Other (See Comments)   Made feel funny      Medication List    TAKE these medications   acetaminophen 500 MG tablet Commonly known as: TYLENOL Take 1 tablet (500 mg total) by mouth every 6 (six) hours as needed for moderate pain or headache. What changed: how much to take   albuterol 108 (90 Base) MCG/ACT inhaler Commonly known as: VENTOLIN HFA Inhale 1-2 puffs into the lungs every 6 (six) hours as needed for wheezing or shortness of breath.   benzonatate 100 MG capsule Commonly known as: TESSALON Take 1 capsule (100 mg total) by mouth 3 (three) times daily as needed for cough.   buPROPion 300 MG 24 hr tablet Commonly known as: WELLBUTRIN XL TAKE 1 TABLET (300 MG TOTAL) BY MOUTH DAILY.   cetirizine 10 MG tablet Commonly known as: ZYRTEC Take 1 tablet (10 mg total) by mouth daily. What changed:   when to take this  reasons to take this   fluticasone 50 MCG/ACT nasal spray Commonly known as: FLONASE Place 2 sprays into both nostrils daily. What changed:   when to take this  reasons to take this   hydrOXYzine 25 MG tablet Commonly known as: ATARAX/VISTARIL Take 1 tablet (25 mg total) by mouth 3 (three) times daily as needed. What changed: reasons to take this   Lavender Oil Oil Apply 1 application topically 2 (two) times daily as needed (knee pain).   methotrexate 2.5 MG tablet Commonly known as: RHEUMATREX Take 25 mg by mouth once a week. Take one tablet by mouth once a week on fridays.  Caution:Chemotherapy. Protect from light.   mirtazapine 15 MG tablet Commonly known as: REMERON Take 1  tablet (15 mg total) by mouth at bedtime.   morphine 10 MG/5ML solution Take 1.3 mLs (2.6 mg total) by mouth every 8 (eight) hours as needed (air hunger (difficulty breathing) and anxiety).   prednisoLONE acetate 1 % ophthalmic suspension Commonly known as: PRED FORTE Place 1 drop into the left eye 4 (four) times daily.   traZODone 50 MG tablet Commonly known as: DESYREL Take 0.5-1 tablets (25-50 mg total) by mouth at bedtime as needed for sleep.       Disposition and follow-up:   KristenDaniel B Chavez was discharged from [facility] in Stable condition.  At the hospital follow up visit please address:  1.  Symptomatic Anemia: Hemoglobin stabilized to 9 after 2 units. HH RN will draw CBC stat twice per week and send results to [facility]. Attending will review and call patient if she needs a transfusion. Please ensure this process is successful  Hypotension: Secondary to decreased PO intake. Mirtazapine may not be adequate to stimulate appetite. Please follow up if she needs any medication changes in regards to this.   2.  Labs / imaging needed at time of follow-up: CBC  3.  Pending labs/ test needing follow-up: None  Follow-up Appointments: Follow-up Information    [name], MD Follow up.   Specialties: Hematology, Oncology Why: The clinic will contact you with an appointment availability.  Contact information: [place] [address] [phone]  [place] Follow up.   Why: Follow up in 10 days. Please call to make an appointment.  Contact information: [address] [place] Hospital Course by problem list: 1. Symptomatic Anemia: Patient presented to the ED with c/o dizziness in light of increased drainage from Pleurx. Her hemoglobin was found to be 6.4. She notes at least 822mL bloody drainage. She received 2 units of pRBCs and hemoglobin  stabilized at approximately 9.   2. Hypotension: Secondary to decreased PO intake. BP improved with IV fluid resuscitation.   Discharge Vitals:   BP 102/89 (BP Location: Right Arm)    Pulse (!) 107    Temp 98.6 F (37 C) (Oral)    Resp 17    Ht 5\' 3"  (1.6 m)    Wt 60.4 kg    SpO2 97%    BMI 23.59 kg/m   Pertinent Labs, Studies, and Procedures:  CBC Latest Ref Rng & Units 04/29/2019 04/26/2019 04/25/2019  WBC - 15.6 13.6(H) 13.2(H)  Hemoglobin 12.0 - 16.0 8.1(A) 9.1(L) 9.2(L)  Hematocrit 36.0 - 46.0 % - 29.2(L) 27.8(L)  Platelets 150 - 399 598(A) 425(H) 357     Discharge Instructions: Discharge Instructions    Call MD for:   Complete by: As directed    Difficulty breathing Dizziness Extreme weakness   Call MD for:  extreme fatigue   Complete by: As directed    Call MD for:  persistant dizziness or light-headedness   Complete by: As directed    Call MD for:  severe uncontrolled pain   Complete by: As directed    Diet general   Complete by: As directed    Discharge instructions   Complete by: As directed    Thank you for allowing [place] to care for you during your hospital stay.   - We have spoken with Dr. Irene Limbo, and he is going to schedule you for an appointment with him either on Monday, Tuesday or Wednesday. His clinic will call to let you know the appointment time.   - The home health nurse who drains your Pleurx, will also do the blood draws to check your blood count. The results will be completed in 1-2 hours and sent to our office. If you need a transfusion based on the results, a doctor will call you with information on next steps.   Increase activity slowly   Complete by: As directed       Signed: Dr. Jose Persia Internal Medicine PGY-1  Pager: [phone] 04/26/2019, 4:04 PM

## 2019-04-26 NOTE — Progress Notes (Signed)
Subjective:   [name] denies any dizziness, headaches or worsening in SOB. She notes that Dr. Prescott Chavez visited her this morning but they did not discuss surgical options. We informed [name] that there are no surgical options to stop the pleural bleeding at this time. She asked about chemotherapy and we did inform that chemo will not stop the bleeding acutely either, so we would need to consider weekly transfusions. Kristen Chavez did not want to continue talking at this time. She states she is going home today.   We talked again after lunch and [name] is open to the idea of outpatient transfusions as needed and having a HH RN come check CBC, as long as results come back in a timely manner. I explained that I will call the cancer center to see what other resources are available.   Objective:  Vital signs in last 24 hours: Vitals:   04/25/19 0837 04/25/19 1107 04/25/19 1638 04/25/19 2114  BP: 108/85 109/82 98/86 99/79   Pulse: (!) 139 (!) 103 (!) 108 (!) 108  Resp:      Temp: 97.9 F (36.6 C) 98.2 F (36.8 C) 98.3 F (36.8 C) 98.9 F (37.2 C)  TempSrc: Oral Oral Oral Oral  SpO2: (!) 86% 100% 97% 99%  Weight:      Height:       Physical Exam Vitals signs and nursing note reviewed.  Constitutional:      Appearance: She is cachectic.  Pulmonary:     Effort: Pulmonary effort is normal. No respiratory distress.  Neurological:     Mental Status: She is alert and oriented to person, place, and time. Mental status is at baseline.  Psychiatric:        Mood and Affect: Affect is blunt.        Behavior: Behavior is withdrawn. Behavior is cooperative.   Assessment/Plan:  Active Problems:   Symptomatic anemia  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    # Symptomatic anemia:  Secondary to worsening output from  Pleurx. Hemoglobin has stabilized at 9.1. TCTS was consulted and recommended continuing drainage of pleurx. TCTS have confirmed there is no surgical intervention at this time. If she wants to continue aggressive treatment, will likely need weekly transfusions in the outpatient setting. We discussed this with patient today and initially she was upset, but on second conversation, sounds like something she is willing to do. We have scheduled her [facility] RN to draw stat CBC on the days that gets her Pleurx drained. If low, an attending at the [facility] clinic will call her with instructions to go to the short stay section of [facility] for a blood transfusion.   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 does not recall speaking to Kristen Chavez about different chemotherapy options, although they seem to have discussed it during her last admission. Will his office for schedule an appointment for her for ASAP.   - Morphine 2.5 mg q8h PRN for air hunger - Albuterol neb q6h PRN - Tessalon Pearls TID PRN for cough  # Hypotension: Improved.   # Hx of depression and anxiety:  Continues to have significant depression and anxiety in light of recent cancer diagnosis. Patient had another follow up  with Kristen Chavez today and their note indicates patient may be coming to terms with the finality of her late stage cancer. She did not express these wishes to [place] today. For now, continue medication treatment and therapy with Kristen Chavez.   - Continue Wellbutrin and Mirtazapine   Dispo: Anticipated discharge today.   Dr. Jose Chavez Internal Medicine PGY-1  Pager: [phone] 04/26/2019, 6:42 AM

## 2019-04-29 ENCOUNTER — Other Ambulatory Visit: Payer: Self-pay | Admitting: Hematology

## 2019-04-29 ENCOUNTER — Telehealth: Payer: Self-pay | Admitting: Hematology

## 2019-04-29 DIAGNOSIS — N3289 Other specified disorders of bladder: Secondary | ICD-10-CM | POA: Diagnosis not present

## 2019-04-29 DIAGNOSIS — D649 Anemia, unspecified: Secondary | ICD-10-CM | POA: Diagnosis not present

## 2019-04-29 DIAGNOSIS — I10 Essential (primary) hypertension: Secondary | ICD-10-CM | POA: Diagnosis not present

## 2019-04-29 DIAGNOSIS — T85818D Embolism due to other internal prosthetic devices, implants and grafts, subsequent encounter: Secondary | ICD-10-CM | POA: Diagnosis not present

## 2019-04-29 DIAGNOSIS — Z48813 Encounter for surgical aftercare following surgery on the respiratory system: Secondary | ICD-10-CM | POA: Diagnosis not present

## 2019-04-29 DIAGNOSIS — C3411 Malignant neoplasm of upper lobe, right bronchus or lung: Secondary | ICD-10-CM | POA: Diagnosis not present

## 2019-04-29 DIAGNOSIS — J910 Malignant pleural effusion: Secondary | ICD-10-CM | POA: Diagnosis not present

## 2019-04-29 DIAGNOSIS — D630 Anemia in neoplastic disease: Secondary | ICD-10-CM | POA: Diagnosis not present

## 2019-04-29 LAB — CBC AND DIFFERENTIAL
Hemoglobin: 8.1 — AB (ref 12.0–16.0)
Platelets: 598 — AB (ref 150–399)
WBC: 15.6

## 2019-04-29 NOTE — Progress Notes (Signed)
START ON PATHWAY REGIMEN - Non-Small Cell Lung     A cycle is every 21 days:     Pemetrexed      Carboplatin   **Always confirm dose/schedule in your pharmacy ordering system**  Patient Characteristics: Stage IV Metastatic, Nonsquamous, Initial Chemotherapy/Immunotherapy, PS = 0, 1, ALK or EGFR or ROS1 or NTRK or MET or RET Genomic Alterations - Awaiting Test Results AJCC T Category: TX Current Disease Status: Distant Metastases AJCC N Category: NX AJCC M Category: M1a AJCC 8 Stage Grouping: IVA Histology: Nonsquamous Cell ROS1 Rearrangement Status: Awaiting Test Results T790M Mutation Status: Not Applicable - EGFR Mutation Negative/Unknown Other Mutations/Biomarkers: No Other Actionable Mutations Chemotherapy/Immunotherapy LOT: Initial Chemotherapy/Immunotherapy Molecular Targeted Therapy: Not Appropriate MET Exon 14 Mutation Status: Awaiting Test Results RET Gene Fusion Status: Awaiting Test Results EGFR Mutation Status: Awaiting Test Results NTRK Gene Fusion Status: Awaiting Test Results PD-L1 Expression Status: Awaiting Test Results ALK Rearrangement Status: Awaiting Test Results BRAF V600E Mutation Status: Awaiting Test Results ECOG Performance Status: 1 Intent of Therapy: Non-Curative / Palliative Intent, Discussed with Patient

## 2019-04-29 NOTE — Telephone Encounter (Signed)
Received a msg to schedule a hospital fu for MS. Shreiner to see Dr. Irene Limbo. I lft the pt a vm to return my call and schedule an appt.

## 2019-04-30 ENCOUNTER — Other Ambulatory Visit: Payer: Self-pay

## 2019-04-30 ENCOUNTER — Telehealth: Payer: Self-pay

## 2019-04-30 ENCOUNTER — Emergency Department (HOSPITAL_COMMUNITY): Payer: Self-pay

## 2019-04-30 ENCOUNTER — Observation Stay (HOSPITAL_COMMUNITY)
Admission: EM | Admit: 2019-04-30 | Discharge: 2019-05-02 | Disposition: A | Discharge status: Home / Self Care | Payer: Self-pay | Attending: Internal Medicine | Admitting: Internal Medicine

## 2019-04-30 ENCOUNTER — Other Ambulatory Visit: Payer: Self-pay | Admitting: Internal Medicine

## 2019-04-30 ENCOUNTER — Encounter: Payer: Self-pay | Admitting: Internal Medicine

## 2019-04-30 ENCOUNTER — Encounter (HOSPITAL_COMMUNITY): Payer: Self-pay

## 2019-04-30 ENCOUNTER — Telehealth: Payer: Self-pay | Admitting: *Deleted

## 2019-04-30 DIAGNOSIS — F329 Major depressive disorder, single episode, unspecified: Secondary | ICD-10-CM | POA: Insufficient documentation

## 2019-04-30 DIAGNOSIS — J910 Malignant pleural effusion: Secondary | ICD-10-CM | POA: Diagnosis not present

## 2019-04-30 DIAGNOSIS — R531 Weakness: Secondary | ICD-10-CM | POA: Diagnosis not present

## 2019-04-30 DIAGNOSIS — N3289 Other specified disorders of bladder: Secondary | ICD-10-CM | POA: Diagnosis present

## 2019-04-30 DIAGNOSIS — C3491 Malignant neoplasm of unspecified part of right bronchus or lung: Secondary | ICD-10-CM

## 2019-04-30 DIAGNOSIS — M170 Bilateral primary osteoarthritis of knee: Secondary | ICD-10-CM | POA: Insufficient documentation

## 2019-04-30 DIAGNOSIS — R52 Pain, unspecified: Secondary | ICD-10-CM | POA: Diagnosis not present

## 2019-04-30 DIAGNOSIS — D494 Neoplasm of unspecified behavior of bladder: Secondary | ICD-10-CM | POA: Insufficient documentation

## 2019-04-30 DIAGNOSIS — C3490 Malignant neoplasm of unspecified part of unspecified bronchus or lung: Secondary | ICD-10-CM | POA: Diagnosis not present

## 2019-04-30 DIAGNOSIS — I10 Essential (primary) hypertension: Secondary | ICD-10-CM | POA: Insufficient documentation

## 2019-04-30 DIAGNOSIS — Z8249 Family history of ischemic heart disease and other diseases of the circulatory system: Secondary | ICD-10-CM | POA: Insufficient documentation

## 2019-04-30 DIAGNOSIS — R6510 Systemic inflammatory response syndrome (SIRS) of non-infectious origin without acute organ dysfunction: Secondary | ICD-10-CM | POA: Insufficient documentation

## 2019-04-30 DIAGNOSIS — R42 Dizziness and giddiness: Secondary | ICD-10-CM | POA: Diagnosis not present

## 2019-04-30 DIAGNOSIS — F419 Anxiety disorder, unspecified: Secondary | ICD-10-CM | POA: Insufficient documentation

## 2019-04-30 DIAGNOSIS — Z20828 Contact with and (suspected) exposure to other viral communicable diseases: Secondary | ICD-10-CM | POA: Insufficient documentation

## 2019-04-30 DIAGNOSIS — Z87891 Personal history of nicotine dependence: Secondary | ICD-10-CM | POA: Insufficient documentation

## 2019-04-30 DIAGNOSIS — Z79899 Other long term (current) drug therapy: Secondary | ICD-10-CM | POA: Insufficient documentation

## 2019-04-30 DIAGNOSIS — C679 Malignant neoplasm of bladder, unspecified: Secondary | ICD-10-CM | POA: Diagnosis not present

## 2019-04-30 DIAGNOSIS — I2699 Other pulmonary embolism without acute cor pulmonale: Secondary | ICD-10-CM | POA: Diagnosis present

## 2019-04-30 DIAGNOSIS — C782 Secondary malignant neoplasm of pleura: Secondary | ICD-10-CM | POA: Insufficient documentation

## 2019-04-30 DIAGNOSIS — I2694 Multiple subsegmental pulmonary emboli without acute cor pulmonale: Principal | ICD-10-CM

## 2019-04-30 DIAGNOSIS — G893 Neoplasm related pain (acute) (chronic): Secondary | ICD-10-CM | POA: Insufficient documentation

## 2019-04-30 DIAGNOSIS — R9431 Abnormal electrocardiogram [ECG] [EKG]: Secondary | ICD-10-CM | POA: Insufficient documentation

## 2019-04-30 DIAGNOSIS — R0602 Shortness of breath: Secondary | ICD-10-CM | POA: Diagnosis not present

## 2019-04-30 DIAGNOSIS — Z888 Allergy status to other drugs, medicaments and biological substances status: Secondary | ICD-10-CM | POA: Insufficient documentation

## 2019-04-30 DIAGNOSIS — D649 Anemia, unspecified: Secondary | ICD-10-CM | POA: Insufficient documentation

## 2019-04-30 DIAGNOSIS — Z66 Do not resuscitate: Secondary | ICD-10-CM | POA: Insufficient documentation

## 2019-04-30 DIAGNOSIS — R000 Tachycardia, unspecified: Secondary | ICD-10-CM | POA: Diagnosis not present

## 2019-04-30 MED ORDER — SODIUM CHLORIDE 0.9 % IV BOLUS (SEPSIS)
1000.0000 mL | Freq: Once | INTRAVENOUS | Status: AC
  Administered 2019-05-01: 1000 mL via INTRAVENOUS

## 2019-04-30 MED ORDER — SODIUM CHLORIDE 0.9 % IV BOLUS (SEPSIS)
500.0000 mL | Freq: Once | INTRAVENOUS | Status: AC
  Administered 2019-05-01: 500 mL via INTRAVENOUS

## 2019-04-30 MED ORDER — SODIUM CHLORIDE 0.9 % IV BOLUS
500.0000 mL | Freq: Once | INTRAVENOUS | Status: AC
  Administered 2019-04-30: 500 mL via INTRAVENOUS

## 2019-04-30 MED ORDER — SODIUM CHLORIDE 0.9 % IV SOLN
2.0000 g | Freq: Once | INTRAVENOUS | Status: AC
  Administered 2019-05-01: 2 g via INTRAVENOUS
  Filled 2019-04-30: qty 2

## 2019-04-30 MED ORDER — VANCOMYCIN HCL IN DEXTROSE 1-5 GM/200ML-% IV SOLN
1000.0000 mg | Freq: Once | INTRAVENOUS | Status: AC
  Administered 2019-05-01: 1000 mg via INTRAVENOUS
  Filled 2019-04-30: qty 200

## 2019-04-30 NOTE — ED Provider Notes (Addendum)
[place] DEPT Provider Note   CSN: [id number] Arrival date & time: 04/30/19  2221     History   Chief Complaint Chief Complaint  Patient presents with   Weakness    HPI Kristen Chavez is a 68 y.o. female.     Kristen Chavez is a 68 y.o. female was recently diagnosed with metastatic stage IV lung cancer and bladder mass, she has a right-sided Pleurx catheter in place for recurrent malignant effusion, also with history of hypertension, depression and anxiety, who presents to the ED today for 3 days of worsening generalized weakness.  She reports that over the past 3 days she has had weakness and fatigue.  Reports she has little energy to even get up and ambulate to the bathroom and afterwards has to lay down for an hour.  She reports that she has had lightheadedness upon standing, and also reports poor appetite.  Today she had a few episodes of nonbloody emesis this evening, she reports this is not atypical for her since her recent cancer diagnosis.  She denies any associated abdominal pain, no diarrhea, melena or hematochezia.  She does report increased shortness of breath although remains on her chronic 2 L oxygen at home.  She reports some mild left-sided pleuritic pain that comes and goes, pain present in the left side of the chest and in the left shoulder blade.  She has a right-sided Pleurx catheter for a chronic malignant effusion, her daughter helps during this and she has noticed some increased bloody drainage from this with a few small blood clots noted.  She had an admission for similar on 10/13 at this time had hemoglobin of 6.5 and was transfused 2 units, hemoglobin of 9 upon discharge.  She reports unchanged chronic cough.  She has had some chills but no fevers at home.  No focal numbness or weakness in any extremities and no associated headache.  She has not yet started on any chemotherapy.  No known sick contacts.  No other aggravating or  alleviating factors.     Past Medical History:  Diagnosis Date   Anxiety    Arthritis    osteoarthritis.   Cancer [facility])    lung   Depression    Hypertension     Patient Active Problem List   Diagnosis Date Noted   Symptomatic anemia 04/24/2019   Adenocarcinoma of right lung, stage 4 ([place])    Counseling regarding advance care planning and goals of care    Malignant pleural effusion    Anemia 04/15/2019   Shortness of breath    DNR (do not resuscitate) discussion    Palliative care by specialist    Pleural effusion, malignant    Adenocarcinoma ([place]) 03/29/2019   Mass of upper lobe of right lung 03/27/2019   Pleural effusion    Corn of toe 12/11/2017   Weight loss, unintentional 12/11/2017   Marijuana use 12/11/2017   Smoking 08/26/2013   ANXIETY 11/25/2007   DEPRESSION 11/25/2007   Essential hypertension 11/25/2007   Osteoarthritis 11/25/2007   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      Home Medications  Prior to Admission medications   Medication Sig Start Date End Date Taking? Authorizing Provider  acetaminophen (TYLENOL) 500 MG tablet Take 1 tablet (500 mg total) by mouth every 6 (six) hours as needed for moderate pain or headache. Patient taking differently: Take 1,000 mg by mouth every 6 (six) hours as needed for moderate pain or headache.  08/18/17  Yes Hairston, Maylon Peppers, FNP  albuterol (VENTOLIN HFA) 108 (90 Base) MCG/ACT inhaler Inhale 1-2 puffs into the lungs every 6 (six) hours as needed for wheezing or shortness of breath. 04/03/19  Yes Santos-Sanchez, Merlene Morse, MD  buPROPion (WELLBUTRIN XL) 300 MG 24 hr tablet  TAKE 1 TABLET (300 MG TOTAL) BY MOUTH DAILY. 03/06/19  Yes [name], MD  cetirizine (ZYRTEC) 10 MG tablet Take 1 tablet (10 mg total) by mouth daily. Patient taking differently: Take 10 mg by mouth daily as needed for allergies.  01/07/19  Yes Newlin, Charlane Ferretti, MD  fluticasone (FLONASE) 50 MCG/ACT nasal spray Place 2 sprays into both nostrils daily. Patient taking differently: Place 2 sprays into both nostrils daily as needed for rhinitis.  01/07/19  Yes Charlott Rakes, MD  hydrOXYzine (ATARAX/VISTARIL) 25 MG tablet Take 1 tablet (25 mg total) by mouth 3 (three) times daily as needed. Patient taking differently: Take 25 mg by mouth 3 (three) times daily as needed for itching.  04/04/19  Yes Jeanmarie Hubert, MD  Lavender Oil OIL Apply 1 application topically 2 (two) times daily as needed (knee pain).   Yes [provider]  methotrexate (RHEUMATREX) 2.5 MG tablet Take 25 mg by mouth once a week. Take one tablet by mouth once a week on fridays.  Caution:Chemotherapy. Protect from light.   Yes [provider]  mirtazapine (REMERON) 15 MG tablet Take 1 tablet (15 mg total) by mouth at bedtime. 04/19/19  Yes Jose Persia, MD  morphine 10 MG/5ML solution Take 1.3 mLs (2.6 mg total) by mouth every 8 (eight) hours as needed (air hunger (difficulty breathing) and anxiety). 04/26/19  Yes Jose Persia, MD  prednisoLONE acetate (PRED FORTE) 1 % ophthalmic suspension Place 1 drop into the left eye 4 (four) times daily.   Yes [provider]  benzonatate (TESSALON) 100 MG capsule Take 1 capsule (100 mg total) by mouth 3 (three) times daily as needed for cough. Patient not taking: Reported on 04/24/2019 04/19/19   Jose Persia, MD  traZODone (DESYREL) 50 MG tablet Take 0.5-1 tablets (25-50 mg total) by mouth at bedtime as needed for sleep. Patient not taking: Reported on 04/24/2019 04/19/19   Jose Persia, MD    Family History Family History  Problem Relation Age of  Onset   Hypertension Mother    Rheum arthritis Mother    Other Father        suicide   Diabetes Paternal Grandmother    Diabetes Paternal Grandfather     Social History Social History   Tobacco Use   Smoking status: Former Smoker    Packs/day: 0.25    Types: Cigarettes    Quit date: 04/14/2014    Years since quitting: 5.0   Smokeless tobacco: Never Used  Substance Use Topics   Alcohol use: Not Currently    Alcohol/week: 21.0 standard drinks    Types: 21 Glasses of wine per week    Comment: socially wine   Drug use: Not Currently    Types: Marijuana     Allergies   Trazodone and nefazodone   Review of Systems Review of Systems  Constitutional:  Positive for chills. Negative for fever.  HENT: Negative.   Respiratory: Positive for cough and shortness of breath.   Cardiovascular: Positive for chest pain. Negative for palpitations and leg swelling.  Gastrointestinal: Positive for nausea and vomiting. Negative for abdominal pain, blood in stool and diarrhea.  Genitourinary: Negative for dysuria and frequency.  Musculoskeletal: Negative for arthralgias and myalgias.  Skin: Negative for color change and rash.  Neurological: Negative for dizziness, syncope, light-headedness and headaches.     Physical Exam Updated Vital Signs BP 99/74 (BP Location: Left Arm)    Pulse (!) 114    Temp (!) 97.5 F (36.4 C) (Oral)    Resp (!) 33    SpO2 100%   Physical Exam Vitals signs and nursing note reviewed.  Constitutional:      General: She is not in acute distress.    Appearance: Normal appearance. She is well-developed and normal weight. She is not diaphoretic.     Comments: Chronically ill-appearing but in no acute distress  HENT:     Head: Normocephalic and atraumatic.     Mouth/Throat:     Mouth: Mucous membranes are moist.     Pharynx: Oropharynx is clear.  Eyes:     General:        Right eye: No discharge.        Left eye: No discharge.     Pupils: Pupils are  equal, round, and reactive to light.     Comments: Conjunctivae pale  Neck:     Musculoskeletal: Neck supple.  Cardiovascular:     Rate and Rhythm: Regular rhythm. Tachycardia present.     Heart sounds: Normal heart sounds.  Pulmonary:     Effort: Pulmonary effort is normal. No respiratory distress.     Breath sounds: Wheezing present. No rales.     Comments: Respirations equal and unlabored on 2 L nasal cannula.  No respiratory distress.  On auscultation patient has some scattered wheezes bilaterally with decreased breath sounds on the right. Abdominal:     General: Bowel sounds are normal. There is no distension.     Palpations: Abdomen is soft. There is no mass.     Tenderness: There is no abdominal tenderness. There is no guarding.     Comments: Abdomen soft, nondistended, nontender to palpation in all quadrants without guarding or peritoneal signs  Musculoskeletal:        General: No deformity.     Right lower leg: No edema.     Left lower leg: No edema.     Comments: Bilateral lower extremities without edema or swelling.  Skin:    General: Skin is warm and dry.     Capillary Refill: Capillary refill takes less than 2 seconds.  Neurological:     Mental Status: She is alert.     Coordination: Coordination normal.     Comments: Speech is clear, able to follow commands Moves extremities without ataxia, coordination intact  Psychiatric:        Mood and Affect: Mood normal.        Behavior: Behavior normal.      ED Treatments / Results  Labs (all labs ordered are listed, but only abnormal results are displayed) Labs Reviewed  COMPREHENSIVE METABOLIC PANEL - Abnormal; Notable for the following components:      Result Value   CO2 21 (*)    Glucose, Bld 145 (*)    Calcium 8.2 (*)    Total Protein 6.1 (*)    Albumin  2.0 (*)    All other components within normal limits  CBC WITH DIFFERENTIAL/PLATELET - Abnormal; Notable for the following components:   WBC 23.5 (*)    RBC  3.07 (*)    Hemoglobin 8.3 (*)    HCT 26.3 (*)    RDW 17.3 (*)    Platelets 685 (*)    Neutro Abs 19.3 (*)    Monocytes Absolute 1.5 (*)    Abs Immature Granulocytes 1.40 (*)    All other components within normal limits  LACTIC ACID, PLASMA - Abnormal; Notable for the following components:   Lactic Acid, Venous 2.0 (*)    All other components within normal limits  CULTURE, BLOOD (ROUTINE X 2)  CULTURE, BLOOD (ROUTINE X 2)  URINE CULTURE  SARS CORONAVIRUS 2 (TAT 6-24 HRS)  URINALYSIS, ROUTINE W REFLEX MICROSCOPIC  LACTIC ACID, PLASMA  APTT  PROTIME-INR  TYPE AND SCREEN  ABO/RH  TROPONIN I (HIGH SENSITIVITY)  TROPONIN I (HIGH SENSITIVITY)    EKG EKG Interpretation  Date/Time:  Tuesday April 30 2019 22:39:54 EDT Ventricular Rate:  115 PR Interval:    QRS Duration: 54 QT Interval:  356 QTC Calculation: 493 R Axis:   31 Text Interpretation:  Sinus tachycardia Nonspecific T abnrm, anterolateral leads Borderline prolonged QT interval No significant change since last tracing Confirmed by Thayer Jew [phone]) on 04/30/2019 11:29:15 PM   Radiology Dg Chest 1 View  Result Date: 04/30/2019 CLINICAL DATA:  Lung cancer weakness EXAM: CHEST  1 VIEW COMPARISON:  04/23/2019, CT 04/24/2019 FINDINGS: Right-sided chest tube remains in place with tip over the right upper lung. The left lung is grossly clear. Similar appearance of right hilar and suprahilar mass. Diffuse hazy opacity right thorax presumably due to pleural disease and loculated fluid. IMPRESSION: Slight increased density in the right thorax may be due to increased pleural fluid or disease. Otherwise similar appearance of pleural masses, right hilar and suprahilar masses. Electronically Signed   By: Donavan Foil M.D.   On: 04/30/2019 23:19    Procedures .Critical Care Performed by: Jacqlyn Larsen, PA-C Authorized by: Jacqlyn Larsen, PA-C   Critical care provider statement:    Critical care time (minutes):  45    Critical care was necessary to treat or prevent imminent or life-threatening deterioration of the following conditions:  Respiratory failure (PE)   Critical care was time spent personally by me on the following activities:  Discussions with consultants, evaluation of patient's response to treatment, examination of patient, ordering and performing treatments and interventions, ordering and review of laboratory studies, ordering and review of radiographic studies, pulse oximetry, re-evaluation of patient's condition, obtaining history from patient or surrogate and review of old charts   (including critical care time)  Medications Ordered in ED Medications  vancomycin (VANCOCIN) IVPB 1000 mg/200 mL premix (has no administration in time range)  ceFEPIme (MAXIPIME) 2 g in sodium chloride 0.9 % 100 mL IVPB (has no administration in time range)  sodium chloride 0.9 % bolus 1,000 mL (1,000 mLs Intravenous New Bag/Given 05/01/19 0021)    And  sodium chloride 0.9 % bolus 500 mL (500 mLs Intravenous New Bag/Given 05/01/19 0020)  sodium chloride 0.9 % bolus 500 mL (0 mLs Intravenous Stopped 05/01/19 0020)     Initial Impression / Assessment and Plan / ED Course  I have reviewed the triage vital signs and the nursing notes.  Pertinent labs & imaging results that were available during my care of the patient were reviewed by  me and considered in my medical decision making (see chart for details).  68 year old female with stage IV metastatic lung cancer presents to the ED for 3 days of worsening generalized weakness as well as increasing shortness of breath.  Patient reports she is also been experiencing some intermittent pleuritic left-sided pain.  She has a right-sided Pleurx catheter for chronic malignant effusion which she reports has had some increased bloody drainage.  Did have recent admission where she required blood transfusions due to increased bloody output from Pleurx catheter.  On arrival patient  tachycardic, systolic BP in the 69C, mildly tachypneic but satting at 100% on 2 L nasal cannula.  Suspect patient may have increasing bloody drainage leading once again to low hemoglobin as the cause for her generalized weakness and worsening shortness of breath but with left-sided pleuritic pain would also consider PE, pain seems atypical for ACS but will check troponins in addition to EKG as well.  While patient is afebrile, and has not had focal infectious symptoms she has been hospitalized multiple times in the past month and is certainly at risk for infection.  We will get basic labs, lactic acid, chest x-ray.  527mL IV fluids ordered.  CBC returned with a white count of 23.5 with this tachycardia and soft blood pressures , chest x-ray with increased opacity in the right lower lobe which could be related to chronic malignant effusion versus new infiltrate.  Given this information will call code sepsis, 30 cc/kg fluid bolus initiated as well as broad-spectrum antibiotics given recent hospitalizations.  Hemoglobin stable at 8.3, no blood transfusion indicated at this time.  Electrolytes overall reassuring, normal renal function and liver function.  Troponin negative.  EKG shows sinus tach without other concerning changes.  With normal creatinine we will plan to get CTA to assess for pneumonia versus worsening effusion as well as PE given pleuritic chest pain.  Called by radiologist, chest CT shows subsegmental pulmonary emboli in the upper and lower left lobes.  This explains patient's pleuritic pain.  Tachycardia is improving and blood pressure has improved as well.  She remained stable on her home 2 L nasal cannula.  Patient will certainly require admission, given patient's bloody output from Pleurx catheter this makes her a high risk candidate for oral anticoagulation.  Heparin drip can be stopped if bloody output increases, this was ordered per pharmacy consult.  Will discuss with hospitalist for  admission.  Case discussed with Dr. Myna Hidalgo with [facility] hospitalist who will see and admit the patient.  Recommends discontinuing heparin drip for now and he will plan to consult with heme-onc in the morning for recommendations on anticoagulation given bloody output from Pleurx catheter.  Patient discussed with Dr. Myna Hidalgo, who saw patient as well and agrees with plan.  Final Clinical Impressions(s) / ED Diagnoses   Final diagnoses:  Multiple subsegmental pulmonary emboli without acute cor pulmonale ([place])  Primary malignant neoplasm of right lung metastatic to other site [facility])  Malignant pleural effusion    ED Discharge Orders    None       Jacqlyn Larsen, [place] 05/01/19 0243    Jacqlyn Larsen, PA-C 05/01/19 [id number]    Merryl Hacker, MD 05/06/19 [phone]

## 2019-04-30 NOTE — Telephone Encounter (Signed)
VO OK wiith me

## 2019-04-30 NOTE — Telephone Encounter (Signed)
Gave dr Software engineer the faxed labs

## 2019-04-30 NOTE — ED Notes (Signed)
X-ray at bedside

## 2019-04-30 NOTE — Telephone Encounter (Signed)
I abstracted her hemoglobin result into epic.  I tried to call Kristen Chavez but there was no answer.  I wanted to discuss her hemoglobin result, that it is trending down, we will recheck it later this week, and she will likely need a transfusion at that point.  I also wanted to discuss her request for a hospice referral.  I have entered the referral that it would be best that she hear from [place] before hospice calls her.  Triage, if she calls back would you please transfer her to me?  Thanks

## 2019-04-30 NOTE — Telephone Encounter (Signed)
Thank you :)

## 2019-04-30 NOTE — ED Triage Notes (Addendum)
Patient coming from home with complaints of generalized weakness that started to get worse over the last few days. Patient is unable to ambulate more than a couple of steps at a time. She experiences dizziness when standing and has had decreased appetite. She was diagnosed with stage 4 lung and bladder cancer about 1 month ago. Patient wears 2 L of oxygen at baseline.

## 2019-04-30 NOTE — Telephone Encounter (Signed)
Palliative Medicine RN Note: Kristen Chavez called my desk phone (not the PMT office or team line) stating "I think I need your help. How can I make that happen?" She sounded very short of breath, and she told me that posterior chest pain kept her awake all night.   I explained that our team does not have a way to get her help quickly and recommended she call her attending MD.  I called back about 5 minutes later to make sure she was able to get in touch with someone. After several attempts to get her, she answered the phone. She has been unable to reach her doctor. I expressed my worry about her pain and SOB, and she agreed that she was worried. In the absence of advice from an MD, we agreed that it would be best if she went to the ED to be evaluated now, not wait for a return call. She reports that her preferred location is [facility], and she will come to the ED.   Marjie Skiff Uliana Brinker, RN, BSN, [facility] Palliative Medicine Team 04/30/2019 9:48 AM Office [phone]

## 2019-04-30 NOTE — Telephone Encounter (Signed)
Requesting to speak with a nurse, please scall pt back.

## 2019-04-30 NOTE — Telephone Encounter (Signed)
Per dr Software engineer, called pt and she informed nurse that [facility] came to her home 10/19, lab work was drawn. Pt is very distraught stating "my body is not strong enough for a transfusion", calmed pt, reassured her, someone is with her.  Called [facility], they will fax labs to triage, asap

## 2019-04-30 NOTE — Telephone Encounter (Signed)
Also needs referral to hospice.

## 2019-04-30 NOTE — Telephone Encounter (Signed)
HGB 8.1 HCT 25.4 PLT 598 WBC 15.6

## 2019-04-30 NOTE — Telephone Encounter (Signed)
rtc to pt, she states she is just having a hard time getting around and needs some assistance also discussed hospice but ask her to speak w/ csw tomorrow about this so her family can be present also, she is agreeable.will need PCS and possibly OT will await csw call back for needed orders.  Called [facility] gave VO per dr Software engineer for repeat labs this thurs 10/22 and next Monday and Thursday. She repeated back.

## 2019-05-01 ENCOUNTER — Emergency Department (HOSPITAL_COMMUNITY): Payer: Self-pay

## 2019-05-01 ENCOUNTER — Encounter (HOSPITAL_COMMUNITY): Payer: Self-pay

## 2019-05-01 DIAGNOSIS — N3289 Other specified disorders of bladder: Secondary | ICD-10-CM | POA: Diagnosis present

## 2019-05-01 DIAGNOSIS — Z66 Do not resuscitate: Secondary | ICD-10-CM

## 2019-05-01 DIAGNOSIS — J910 Malignant pleural effusion: Secondary | ICD-10-CM

## 2019-05-01 DIAGNOSIS — C3491 Malignant neoplasm of unspecified part of right bronchus or lung: Secondary | ICD-10-CM

## 2019-05-01 DIAGNOSIS — Z7189 Other specified counseling: Secondary | ICD-10-CM

## 2019-05-01 DIAGNOSIS — R0602 Shortness of breath: Secondary | ICD-10-CM | POA: Diagnosis not present

## 2019-05-01 DIAGNOSIS — I2699 Other pulmonary embolism without acute cor pulmonale: Secondary | ICD-10-CM | POA: Diagnosis present

## 2019-05-01 DIAGNOSIS — R6510 Systemic inflammatory response syndrome (SIRS) of non-infectious origin without acute organ dysfunction: Secondary | ICD-10-CM | POA: Diagnosis present

## 2019-05-01 DIAGNOSIS — I2694 Multiple subsegmental pulmonary emboli without acute cor pulmonale: Secondary | ICD-10-CM

## 2019-05-01 DIAGNOSIS — G893 Neoplasm related pain (acute) (chronic): Secondary | ICD-10-CM

## 2019-05-01 LAB — CBC WITH DIFFERENTIAL/PLATELET
Abs Immature Granulocytes: 1.4 10*3/uL — ABNORMAL HIGH (ref 0.00–0.07)
Basophils Absolute: 0.1 10*3/uL (ref 0.0–0.1)
Basophils Relative: 1 %
Eosinophils Absolute: 0.1 10*3/uL (ref 0.0–0.5)
Eosinophils Relative: 0 %
HCT: 26.3 % — ABNORMAL LOW (ref 36.0–46.0)
Hemoglobin: 8.3 g/dL — ABNORMAL LOW (ref 12.0–15.0)
Immature Granulocytes: 6 %
Lymphocytes Relative: 5 %
Lymphs Abs: 1.1 10*3/uL (ref 0.7–4.0)
MCH: 27 pg (ref 26.0–34.0)
MCHC: 31.6 g/dL (ref 30.0–36.0)
MCV: 85.7 fL (ref 80.0–100.0)
Monocytes Absolute: 1.5 10*3/uL — ABNORMAL HIGH (ref 0.1–1.0)
Monocytes Relative: 6 %
Neutro Abs: 19.3 10*3/uL — ABNORMAL HIGH (ref 1.7–7.7)
Neutrophils Relative %: 82 %
Platelets: 685 10*3/uL — ABNORMAL HIGH (ref 150–400)
RBC: 3.07 MIL/uL — ABNORMAL LOW (ref 3.87–5.11)
RDW: 17.3 % — ABNORMAL HIGH (ref 11.5–15.5)
WBC: 23.5 10*3/uL — ABNORMAL HIGH (ref 4.0–10.5)
nRBC: 0 % (ref 0.0–0.2)

## 2019-05-01 LAB — URINALYSIS, ROUTINE W REFLEX MICROSCOPIC
Bilirubin Urine: NEGATIVE
Glucose, UA: NEGATIVE mg/dL
Hgb urine dipstick: NEGATIVE
Ketones, ur: NEGATIVE mg/dL
Nitrite: NEGATIVE
Protein, ur: NEGATIVE mg/dL
Specific Gravity, Urine: 1.044 — ABNORMAL HIGH (ref 1.005–1.030)
pH: 6 (ref 5.0–8.0)

## 2019-05-01 LAB — COMPREHENSIVE METABOLIC PANEL
ALT: 14 U/L (ref 0–44)
AST: 16 U/L (ref 15–41)
Albumin: 2 g/dL — ABNORMAL LOW (ref 3.5–5.0)
Alkaline Phosphatase: 107 U/L (ref 38–126)
Anion gap: 11 (ref 5–15)
BUN: 18 mg/dL (ref 8–23)
CO2: 21 mmol/L — ABNORMAL LOW (ref 22–32)
Calcium: 8.2 mg/dL — ABNORMAL LOW (ref 8.9–10.3)
Chloride: 103 mmol/L (ref 98–111)
Creatinine, Ser: 0.93 mg/dL (ref 0.44–1.00)
GFR calc Af Amer: >60 mL/min (ref >60)
GFR calc non Af Amer: >60 mL/min (ref >60)
Glucose, Bld: 145 mg/dL — ABNORMAL HIGH (ref 70–99)
Potassium: 4.3 mmol/L (ref 3.5–5.1)
Sodium: 135 mmol/L (ref 135–145)
Total Bilirubin: 0.6 mg/dL (ref 0.3–1.2)
Total Protein: 6.1 g/dL — ABNORMAL LOW (ref 6.5–8.1)

## 2019-05-01 LAB — ABO/RH: ABO/RH(D): O POS

## 2019-05-01 LAB — CBG MONITORING, ED: Glucose-Capillary: 96 mg/dL (ref 70–99)

## 2019-05-01 LAB — TROPONIN I (HIGH SENSITIVITY)
Troponin I (High Sensitivity): 2 ng/L (ref <18)
Troponin I (High Sensitivity): 2 ng/L (ref <18)

## 2019-05-01 LAB — SARS CORONAVIRUS 2 (TAT 6-24 HRS): SARS Coronavirus 2: NEGATIVE

## 2019-05-01 LAB — TYPE AND SCREEN
ABO/RH(D): O POS
Antibody Screen: NEGATIVE

## 2019-05-01 LAB — LACTIC ACID, PLASMA
Lactic Acid, Venous: 1.8 mmol/L (ref 0.5–1.9)
Lactic Acid, Venous: 2 mmol/L (ref 0.5–1.9)

## 2019-05-01 LAB — BRAIN NATRIURETIC PEPTIDE: B Natriuretic Peptide: 25.8 pg/mL (ref 0.0–100.0)

## 2019-05-01 MED ORDER — POLYETHYLENE GLYCOL 3350 17 G PO PACK: 17.0000 g | PACK | Freq: Every day | ORAL | Status: DC | PRN

## 2019-05-01 MED ORDER — MORPHINE SULFATE (CONCENTRATE) 10 MG/0.5ML PO SOLN
5.0000 mg | ORAL | Status: DC | PRN
  Administered 2019-05-01: 5 mg via ORAL
  Filled 2019-05-01: qty 0.5

## 2019-05-01 MED ORDER — ACETAMINOPHEN 650 MG RE SUPP: 650.0000 mg | Freq: Four times a day (QID) | RECTAL | Status: DC | PRN

## 2019-05-01 MED ORDER — ONDANSETRON HCL 4 MG/2ML IJ SOLN
4.0000 mg | Freq: Four times a day (QID) | INTRAMUSCULAR | Status: DC | PRN
  Administered 2019-05-01: 4 mg via INTRAVENOUS
  Filled 2019-05-01: qty 2

## 2019-05-01 MED ORDER — LIDOCAINE 5 % EX PTCH
1.0000 | MEDICATED_PATCH | CUTANEOUS | Status: DC
  Administered 2019-05-01 (×2): 1 via TRANSDERMAL
  Filled 2019-05-01 (×2): qty 1

## 2019-05-01 MED ORDER — ACETAMINOPHEN 500 MG PO TABS
1000.0000 mg | ORAL_TABLET | Freq: Once | ORAL | Status: AC
  Administered 2019-05-01: 1000 mg via ORAL
  Filled 2019-05-01: qty 2

## 2019-05-01 MED ORDER — MORPHINE SULFATE (CONCENTRATE) 10 MG/0.5ML PO SOLN
5.0000 mg | ORAL | Status: DC | PRN
  Administered 2019-05-01 – 2019-05-02 (×2): 10 mg via SUBLINGUAL
  Filled 2019-05-01 (×2): qty 0.5

## 2019-05-01 MED ORDER — ALBUTEROL SULFATE HFA 108 (90 BASE) MCG/ACT IN AERS
1.0000 | INHALATION_SPRAY | Freq: Four times a day (QID) | RESPIRATORY_TRACT | Status: DC | PRN
  Filled 2019-05-01: qty 6.7

## 2019-05-01 MED ORDER — ENSURE ENLIVE PO LIQD
237.0000 mL | Freq: Two times a day (BID) | ORAL | Status: DC
  Administered 2019-05-01 – 2019-05-02 (×3): 237 mL via ORAL

## 2019-05-01 MED ORDER — PREDNISOLONE ACETATE 1 % OP SUSP
1.0000 [drp] | Freq: Four times a day (QID) | OPHTHALMIC | Status: DC
  Administered 2019-05-01 – 2019-05-02 (×4): 1 [drp] via OPHTHALMIC
  Filled 2019-05-01: qty 5

## 2019-05-01 MED ORDER — ONDANSETRON HCL 4 MG PO TABS: 4.0000 mg | ORAL_TABLET | Freq: Four times a day (QID) | ORAL | Status: DC | PRN

## 2019-05-01 MED ORDER — ACETAMINOPHEN 325 MG PO TABS: 650.0000 mg | ORAL_TABLET | Freq: Four times a day (QID) | ORAL | Status: DC | PRN

## 2019-05-01 MED ORDER — HYDROCODONE-ACETAMINOPHEN 5-325 MG PO TABS
1.0000 | ORAL_TABLET | ORAL | Status: DC | PRN
  Administered 2019-05-01 (×2): 2 via ORAL
  Filled 2019-05-01 (×2): qty 2

## 2019-05-01 MED ORDER — SODIUM CHLORIDE 0.9 % IV SOLN
INTRAVENOUS | Status: AC
  Administered 2019-05-01: 09:00:00 via INTRAVENOUS

## 2019-05-01 MED ORDER — HEPARIN (PORCINE) 25000 UT/250ML-% IV SOLN: 950.0000 [IU]/h | INTRAVENOUS | Status: DC

## 2019-05-01 MED ORDER — SODIUM CHLORIDE 0.9% FLUSH
3.0000 mL | Freq: Two times a day (BID) | INTRAVENOUS | Status: DC
  Administered 2019-05-01 – 2019-05-02 (×2): 3 mL via INTRAVENOUS

## 2019-05-01 MED ORDER — LORAZEPAM 1 MG PO TABS
1.0000 mg | ORAL_TABLET | ORAL | Status: DC | PRN
  Administered 2019-05-01 – 2019-05-02 (×3): 1 mg via ORAL
  Filled 2019-05-01 (×3): qty 1

## 2019-05-01 MED ORDER — MIRTAZAPINE 15 MG PO TABS: 15.0000 mg | ORAL_TABLET | Freq: Every day | ORAL | Status: DC

## 2019-05-01 MED ORDER — LIDOCAINE 5 % EX PTCH
2.0000 | MEDICATED_PATCH | CUTANEOUS | Status: DC
  Administered 2019-05-01: 2 via TRANSDERMAL
  Filled 2019-05-01 (×3): qty 2

## 2019-05-01 MED ORDER — IOHEXOL 350 MG/ML SOLN
100.0000 mL | Freq: Once | INTRAVENOUS | Status: AC | PRN
  Administered 2019-05-01: 100 mL via INTRAVENOUS

## 2019-05-01 MED ORDER — MORPHINE SULFATE (PF) 4 MG/ML IV SOLN
4.0000 mg | INTRAVENOUS | Status: DC | PRN
  Administered 2019-05-01: 4 mg via INTRAVENOUS
  Filled 2019-05-01: qty 1

## 2019-05-01 MED ORDER — SODIUM CHLORIDE (PF) 0.9 % IJ SOLN
INTRAMUSCULAR | Status: AC
  Administered 2019-05-01: 02:00:00
  Filled 2019-05-01: qty 50

## 2019-05-01 MED ORDER — BUPROPION HCL ER (XL) 300 MG PO TB24
300.0000 mg | ORAL_TABLET | Freq: Every day | ORAL | Status: DC
  Administered 2019-05-01 – 2019-05-02 (×2): 300 mg via ORAL
  Filled 2019-05-01 (×2): qty 1

## 2019-05-01 MED ORDER — LIDOCAINE 5 % EX PTCH: 2.0000 | MEDICATED_PATCH | CUTANEOUS | Status: DC

## 2019-05-01 MED ORDER — HEPARIN BOLUS VIA INFUSION
2500.0000 [IU] | Freq: Once | INTRAVENOUS | Status: DC
  Filled 2019-05-01: qty 2500

## 2019-05-01 MED ORDER — HYDROXYZINE HCL 25 MG PO TABS: 25.0000 mg | ORAL_TABLET | Freq: Three times a day (TID) | ORAL | Status: DC | PRN

## 2019-05-01 NOTE — Addendum Note (Signed)
Addended by: Hulan Fray on: 05/01/2019 06:36 PM   Modules accepted: Orders

## 2019-05-01 NOTE — Telephone Encounter (Signed)
Noted. Thanks.

## 2019-05-01 NOTE — ED Notes (Signed)
Attempted to call report but floor RN unable to accept at this time.

## 2019-05-01 NOTE — Telephone Encounter (Signed)
Received call from Ag[employer]ito Games, RN with [facility] regarding patient's request for home hospice care. Dr. Lynnae January placed order for this yesterday, however, patient is currently admitted at [facility]. Agapito Games will contact 4th floor Case Manager to ensure patent is set up with home hospice at discharge. Hubbard Hartshorn, BSN, RN-BC

## 2019-05-01 NOTE — Progress Notes (Signed)
Patient is a 68 year old female with history of metastatic adenocarcinoma of the lung with malignant pleural effusion, urinary bladder mass, anxiety, depression, hypertension, smoker who presents to the emergency department with progressive generalized weakness, fatigue, loss of appetite, shortness of breath.  She has been admitted several times recently for same problem.  She recently had Pleurx catheter placement on her right chest and it continues to drain bloody pleural effusion.  In the emergency department, she was found to be tachycardic, tachypneic.  CTA showed new tiny subsegmental PE in the left upper and lower lobes, unchanged right pleural effusion, pleural masses, no significant change in the right upper lobe mass, significant lymphadenopathy. After admission, his oncology, palliative care consulted.  Patient has high risks of of bleeding and she continues to have bloody drainage through the pleurx catheter so anticoagulation not started.  Palliative care discussed with family and the patient and the plan is to discharge her home tomorrow with home hospice. Patient seen by Dr. Myna Hidalgo this morning. Patient could not be discharged today because hospice at home could not be set up today.

## 2019-05-01 NOTE — Progress Notes (Addendum)
HEMATOLOGY-ONCOLOGY PROGRESS NOTE  SUBJECTIVE: Kristen Chavez presented to the Er with weakness, fatigue, shortness of breath. Continues to have bloody drainage from Pleurx catheter. CTA of the chest with new tiny subsegmental PEs in the left upper and lower lobes. Unchanged right pleural effusion, pleural masses, and right upper lobe mass. WBC 23.5,  Hemoglobin 8.3 (stable), platelets 685,000. Unable to anticoagulate due to bleeding from Pleurx catheter.   When seen today, the patient reports ongoing fatigue and weakness.  Continues to have shortness of breath.  Drains Pleurx catheter every other day and states that she has just shy of 1 L of bloody fluid removed.  Denies nausea and vomiting.  States that she "wants to go home and die."  Has expressed that she is not interested in pursuing any treatment for her cancer.  She wants to focus on comfort and quality of life.  Has been seen by the palliative care team already today who has spoken with the patient's daughter and arranging for full comfort measures, DNR/DNI, and home with hospice likely tomorrow.  REVIEW OF SYSTEMS:   Constitutional: Denies fevers and chills. Has ongoing weakness and fatigue.Marland Kitchen Respiratory: Continues to have shortness of breath. Cardiovascular: Reports left-sided chest discomfort Gastrointestinal: Denies nausea and vomiting. Skin: Denies abnormal skin rashes Lymphatics: Denies new lymphadenopathy or easy bruising Neurological:Denies numbness, tingling or new weaknesses Behavioral/Psych: Mood is stable, no new changes  Extremities: No lower extremity edema All other systems were reviewed with the patient and are negative.  I have reviewed the past medical history, past surgical history, social history and family history with the patient and they are unchanged from previous note.   PHYSICAL EXAMINATION: ECOG PERFORMANCE STATUS: 3 - Symptomatic, >50% confined to bed  Vitals:   05/01/19 0810 05/01/19 0837  BP:  97/72   Pulse:  100  Resp: (!) 26 (!) 22  Temp:  98.2 F (36.8 C)  SpO2:  100%   Filed Weights   05/01/19 0021 05/01/19 0837  Weight: 132 lb (59.9 kg) 126 lb 8.7 oz (57.4 kg)    Intake/Output from previous day: 10/20 0701 - 10/21 0700 In: 2300 [IV Piggyback:2300] Out: -   GENERAL: Awake and alert, tachypneic SKIN: skin color, texture, turgor are normal, no rashes or significant lesions EYES: normal, Conjunctiva are pink and non-injected, sclera clear OROPHARYNX:no exudate, no erythema and lips, buccal mucosa, and tongue normal  NECK: supple, thyroid normal size, non-tender, without nodularity LYMPH:  no palpable lymphadenopathy in the cervical, axillary or inguinal LUNGS: Diminished breath sounds on the right, scattered rhonchi, tachypneic HEART: Tachycardic, no murmurs.  No lower extremity edema. ABDOMEN:abdomen soft, non-tender and normal bowel sounds Musculoskeletal:no cyanosis of digits and no clubbing  NEURO: alert & oriented x 3 with fluent speech, no focal motor/sensory deficits  LABORATORY DATA:  I have reviewed the data as listed CMP Latest Ref Rng & Units 04/30/2019 04/25/2019 04/23/2019  Glucose 70 - 99 mg/dL 145(H) 84 131(H)  BUN 8 - 23 mg/dL 18 7(L) 13  Creatinine 0.44 - 1.00 mg/dL 0.93 0.88 0.99  Sodium 135 - 145 mmol/L 135 137 135  Potassium 3.5 - 5.1 mmol/L 4.3 4.1 4.4  Chloride 98 - 111 mmol/L 103 109 103  CO2 22 - 32 mmol/L 21(L) 20(L) 21(L)  Calcium 8.9 - 10.3 mg/dL 8.2(L) 8.0(L) 8.0(L)  Total Protein 6.5 - 8.1 g/dL 6.1(L) 5.0(L) 5.1(L)  Total Bilirubin 0.3 - 1.2 mg/dL 0.6 0.4 0.4  Alkaline Phos 38 - 126 U/L 107 92 92  AST  15 - 41 U/L _0 ALT 0 - 44 U/L _1 Lab Results  Component Value Date   WBC 23.5 (H) 04/30/2019   HGB 8.3 (L) 04/30/2019   HCT 26.3 (L) 04/30/2019   MCV 85.7 04/30/2019   PLT 685 (H) 04/30/2019   NEUTROABS 19.3 (H) 04/30/2019    Dg Chest 1 View  Result Date: 04/30/2019 CLINICAL DATA:  Lung cancer weakness EXAM:  CHEST  1 VIEW COMPARISON:  04/23/2019, CT 04/24/2019 FINDINGS: Right-sided chest tube remains in place with tip over the right upper lung. The left lung is grossly clear. Similar appearance of right hilar and suprahilar mass. Diffuse hazy opacity right thorax presumably due to pleural disease and loculated fluid. IMPRESSION: Slight increased density in the right thorax may be due to increased pleural fluid or disease. Otherwise similar appearance of pleural masses, right hilar and suprahilar masses. Electronically Signed   By: Donavan Foil M.D.   On: 04/30/2019 23:19   Dg Chest 2 View  Result Date: 04/24/2019 CLINICAL DATA:  68 year old female with shortness of breath. EXAM: CHEST - 2 VIEW COMPARISON:  Chest radiograph dated 04/15/2019 and CT dated 04/15/2019 FINDINGS: A right-sided chest tube appears to have been advanced with tip now over the posterior right upper lobe pleural surface. Loculated pleural effusion and right apical pleural based loculated effusion or masses as well as right hilar mass. Overall no significant interval change aeration of the right lung since the radiograph of 04/25/2019. The left lung remains clear. No pneumothorax. Stable cardiomediastinal silhouette. No acute osseous pathology. IMPRESSION: No significant interval change in the appearance of the loculated right pleural effusion and right hilar mass. No pneumothorax. Electronically Signed   By: Anner Crete M.D.   On: 04/24/2019 00:09   Dg Chest 2 View  Result Date: 04/15/2019 CLINICAL DATA:  Chest pain and dizziness. Lung carcinoma. EXAM: CHEST - 2 VIEW COMPARISON:  04/02/2019 FINDINGS: Heart size remains normal. Left chest tube remains in place. Moderate right pleural effusion is increased in size since previous study. No pneumothorax visualized. Multiple base masses are again seen in the right lung apex, without significant change. Mediastinal and right hilar lymphadenopathy remains stable. Left lung remains clear.  IMPRESSION: Moderate right pleural effusion, increased in size since prior study. Right chest tube remains in place. Multiple pleural base masses in right lung apex, without significant change. No significant change in mediastinal and right hilar lymphadenopathy. Electronically Signed   By: Marlaine Hind M.D.   On: 04/15/2019 13:18   Ct Chest Wo Contrast  Result Date: 04/24/2019 CLINICAL DATA:  68 year old female with pleural effusion. Recent diagnosis of stage IV lung cancer. EXAM: CT CHEST WITHOUT CONTRAST TECHNIQUE: Multidetector CT imaging of the chest was performed following the standard protocol without IV contrast. COMPARISON:  Chest radiograph dated 04/23/2019 and CT dated 04/15/2019 FINDINGS: Evaluation of this exam is limited in the absence of intravenous contrast. Cardiovascular: There is no cardiomegaly or pericardial effusion. The thoracic aorta and central pulmonary arteries are grossly unremarkable on this noncontrast CT. Mediastinum/Nodes: Right hilar/suprahilar mass measures approximately 6.9 x 3.9 cm in greatest axial dimensions in the suprahilar region and 4.6 x 3.7 cm along the right aspect of the upper mediastinum. This is similar to prior CT. No mediastinal fluid collection. Lungs/Pleura: Right-sided pleural catheter with tip in the posterior right upper lobe pleural surface, slightly more superior compared to the prior CT. No significant interval change in the lobulated pleural based low  attenuating masses/collections since the prior CT. These likely represent combination of pleural implants and loculated effusions. There is diffuse interstitial prominence and nodularity in the right upper lobe concerning for metastatic disease and lymphangitic spread of tumor. There is associated mass effect and compressive atelectasis of the majority of the right lower lobe similar to prior CT. There is a 5 mm nodule in the left lower lobe. There is no pneumothorax. There is occlusion of the right upper  lobe bronchus by the suprahilar mass. There is compression of the bronchus intermedius and right middle and right lower lobe bronchi. Upper Abdomen: No acute abnormality. Musculoskeletal: Multilevel degenerative changes of the spine. No acute osseous pathology. IMPRESSION: 1. No significant interval change in the size or appearance of the right hilar/suprahilar mass compared to the prior CT. 2. No interval change in the size or appearance of the pleural based masses and loculated collections since the prior CT. 3. Right-sided pleural catheter with tip in the posterior right upper lobe pleural surface, slightly more superior compared to the prior CT. 4. Diffuse interstitial prominence and nodularity in the right upper lobe concerning for metastatic disease and lymphangitic spread of tumor. 5. Stable compressive atelectasis of the majority of the right lower lobe. 6. A 5 mm left lower lobe pulmonary nodule. Electronically Signed   By: Anner Crete M.D.   On: 04/24/2019 18:00   Ct Angio Chest Pe W And/or Wo Contrast  Result Date: 05/01/2019 CLINICAL DATA:  Shortness of breath. Stage IV lung cancer. Right PleurX catheter in place. EXAM: CT ANGIOGRAPHY CHEST WITH CONTRAST TECHNIQUE: Multidetector CT imaging of the chest was performed using the standard protocol during bolus administration of intravenous contrast. Multiplanar CT image reconstructions and MIPs were obtained to evaluate the vascular anatomy. CONTRAST:  193m OMNIPAQUE IOHEXOL 350 MG/ML SOLN COMPARISON:  Radiograph earlier this day. Chest CT 1 week ago 04/24/2019. Chest CTA 2 weeks ago 04/15/2019 FINDINGS: Cardiovascular: Tiny subsegmental pulmonary emboli in the left upper and lower lobe. No more central pulmonary emboli. The right upper lobe pulmonary arteries are encased by a right hilar adenopathy/mass. The thoracic aorta is normal in caliber. No aortic dissection. Heart is normal in size. No pericardial effusion. Mediastinum/Nodes: Lobulated  right hilar mass measures 4.8 x 3.2 cm, not significantly changed. Right paramediastinal nodal conglomerate measures 3.5 x 2.7 cm, also not significantly changed. This appears contiguous with lobulated soft tissue density extending to the upper lobe. Prevascular and bilateral hilar adenopathy is not significantly changed. Decompressed esophagus. Lungs/Pleura: Right PleurX catheter in place tip posteriorly. Partially loculated malignant right pleural effusion and pleural mass are not significantly changed from prior. Diffuse right upper lobe nodularity is again seen. Right apical septal thickening with equivocal increase. Compressive atelectasis in the right lung from pleural based nodules/masses. Pleural or pulmonary nodularity in the anterior right middle lobe. The pleural based left lower lobe pulmonary nodule on prior exam measuring 5 mm, series 7, image 87, unchanged. Right upper lobe bronchial occlusion again seen. Mass effect on the right bronchus intermedius, upper and lower lobe bronchi again seen. Left bronchi are patent. Trachea is patent. Upper Abdomen: No acute findings. Musculoskeletal: Bony sclerosis of midthoracic vertebra, with associated degenerative change. Sclerotic cervical vertebral also seen. Review of the MIP images confirms the above findings. IMPRESSION: 1. New tiny subsegmental pulmonary emboli in the left upper and lower lobe. 2. Right PleurX catheter in place with tip in the posterior right hemithorax. No significant change in malignant right pleural effusion and pleural  masses. 3. Right lung cancer with right upper lobe masses, and multifocal mediastinal and hilar adenopathy, is overall not significantly changed from prior imaging. Equivocal slight increase in septal thickening at the right lung apex. 4. Sclerotic changes in the cervical and thoracic vertebra are unchanged, indeterminate for degenerative versus metastasis. Aortic Atherosclerosis (ICD10-I70.0). Critical Value/emergent  results were called by telephone at the time of interpretation on 05/01/2019 at 2:12 am to Bonnie , who verbally acknowledged these results. Electronically Signed   By: Keith Rake M.D.   On: 05/01/2019 02:12   Ct Angio Chest Pe W And/or Wo Contrast  Result Date: 04/15/2019 CLINICAL DATA:  68 year old female with acute shortness of breath for several weeks. Recent diagnosis of stage IV lung cancer. EXAM: CT ANGIOGRAPHY CHEST WITH CONTRAST TECHNIQUE: Multidetector CT imaging of the chest was performed using the standard protocol during bolus administration of intravenous contrast. Multiplanar CT image reconstructions and MIPs were obtained to evaluate the vascular anatomy. CONTRAST:  61m OMNIPAQUE IOHEXOL 300 MG/ML  SOLN COMPARISON:  03/27/2019 CT and prior studies. FINDINGS: Cardiovascular: This is a technically satisfactory study. No pulmonary emboli are identified. Heart size is normal. No thoracic aortic aneurysm or pericardial effusion. Mediastinum/Nodes: Bilateral mediastinal and hilar lymphadenopathy are unchanged with the index RIGHT hilar node measuring 3.6 x 4.3 cm (series 7: Image 63). No new mediastinal abnormalities are identified. Lungs/Pleura: Confluent and nodular masses within the MEDIAL RIGHT UPPER lobe again identified and unchanged with approximate measurements of 5.3 x 7.8 x 6.7 cm. This mass is again noted to case the central RIGHT pulmonary vessels. There is been interval placement of a RIGHT pleural catheter with decrease in loculated RIGHT pleural effusion but still moderate to large. Pleural nodules are again identified. Associated atelectasis within the RIGHT lung noted. Interlobular septal thickening throughout the UPPER and mid RIGHT lung noted. A 4 mm LEFT UPPER lobe nodule (8:40) is unchanged. Mild LEFT basilar atelectasis noted. No pneumothorax. Upper Abdomen: No acute abnormality. Musculoskeletal: No acute abnormality. Sclerosis within scattered cervical, thoracic  and UPPER lumbar vertebra noted and may be related to degenerative changes but nonspecific. Review of the MIP images confirms the above findings. IMPRESSION: 1. No evidence of pulmonary emboli. 2. Little significant change in RIGHT UPPER lobe mass/masses, bilateral mediastinal and hilar lymphadenopathy and pleural masses compatible with malignancy/metastatic disease. 3. RIGHT pleural catheter in place with decrease in loculated RIGHT pleural effusion since the prior study, but continues to be moderate to large. Electronically Signed   By: JMargarette CanadaM.D.   On: 04/15/2019 17:32   Dg Chest Port 1 View  Result Date: 04/02/2019 CLINICAL DATA:  Pleural effusion, shortness of breath EXAM: PORTABLE CHEST 1 VIEW COMPARISON:  04/01/2019 FINDINGS: Small bore right-sided pleural drainage catheter remains unchanged in positioning. Stable heart size. Bulky right hilar adenopathy. Rounded peripheral opacities at the right lung apex and lateral right chest wall, unchanged. Small right pleural effusion persists. Bandlike opacity in the periphery of the right lung may represent atelectasis versus loculated pleural fluid within the minor fissure. Increasing hazy opacities within the right mid and lower lung field. Left lung remains clear. No pneumothorax. IMPRESSION: 1. Persistent small right pleural effusion status post pleural drainage catheter placement. No pneumothorax. 2. Increasing opacity within the right lung, atelectasis versus infection. Electronically Signed   By: NDavina PokeM.D.   On: 04/02/2019 09:21   Dg Chest Port 1 View  Result Date: 04/01/2019 CLINICAL DATA:  68year old female with a history of  right tunneled pleural tube placement. EXAM: PORTABLE CHEST 1 VIEW COMPARISON:  March 31, 2019 FINDINGS: Cardiomediastinal silhouette unchanged in size and contour. Compared to the prior plain film there has been significant reduction in the right-sided pleural fluid with lentiform opacity at the lateral  right chest, and persisting opacity at the right lung apex. Blunting of the right costophrenic angle. Lobulated opacity in the right hilar region likely corresponds to adenopathy on prior CT. Pleural catheter at the left lung base.  No pneumothorax. Reticular opacities throughout the right lung. Asymmetric elevation of the right hemidiaphragm Left lung relatively well aerated with no large left pleural effusion or pneumothorax. IMPRESSION: Interval placement of right-sided pleural catheter with near complete resolution of right-sided pleural effusion, and no pneumothorax. Lobulated soft tissue in the right hilar region likely corresponds to adenopathy on prior CT. Peripheral opacities at the right apex and right lateral lung may represent trapped pleural fluid or soft tissue implants. Electronically Signed   By: Corrie Mckusick D.O.   On: 04/01/2019 16:00   Dg C-arm 1-60 Min-no Report  Result Date: 04/01/2019 Fluoroscopy was utilized by the requesting physician.  No radiographic interpretation.    ASSESSMENT AND PLAN: Yaretzy B Rogersis a68 y.o.femalewith:  1. 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 Malignant pleural effusion with rapid reaccumulation s/p indwelling rt sided chest tube 3. 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 Extensive smoking history. 5. Anemia -- likely from blood loss from pleural effusion and anemia of chronic disease from metastatic malignancy.  PLAN: -goals of care discussed with patient, no family at bedside, but palliative care APP has been in contact with the patient's daughter.  The patient is very clear this morning that she does not want to pursue treatment for her stage IV lung cancer.  She wants to focus on comfort and quality of life.  Arrangements are currently being made for the patient to discharge home with hospice, likely tomorrow. -Agree with no anticoagulation for PE due bloody pleural fluid.   The risks of anticoagulation outweigh the benefits. -Continue current pain medications and antiemetics as needed.  Titrate to comfort. -Currently on antibiotics for possible pneumonia.  Will defer to hospitalist for ongoing management.   LOS: 0 days   Mikey Bussing, DNP, AGPCNP-BC, AOCNP 05/01/19  ADDENDUM  .Patient was Personally and independently interviewed, examined and relevant elements of the history of present illness were reviewed in details and an assessment and plan was created. All elements of the patient's history of present illness , assessment and plan were discussed in details with Mikey Bussing, DNP. The above documentation reflects our combined findings assessment and plan.  I met with the patient and her daughter at bedside and had a detailed goals of care discussion.  We discussed the status of her metastatic lung cancer and a new pulmonary embolism.  Anticoagulation is not possible due to overt hemorrhagic pleural effusion.  Her functional status has deteriorated rapidly and she has not been able to stay out of the hospital for long enough to engage with palliative treatments. Patient notes that she has come to grips with her prognosis and feels at peace and unequivocally chooses to proceed with best supportive cares through hospice and does not want to pursue any palliative oncologic treatments at this time. I wished her and her daughter the best of luck. I adjusted her pain medications to optimize symptom control. Appreciate help from the hospitalist and palliative care teams.  We shall cancel all her outpatient oncologic follow-ups and treatment plan in light of her change in goals of care.  Sullivan Lone MD MS

## 2019-05-01 NOTE — Progress Notes (Signed)
Manufacturing engineer [facility])  Received referral from PMT NP Stanton Kidney, for hospice services at home.    Called dtr Chrys Racer and left detailed message about how to return my call.  Will reach out to [place] in am to clarify if [facility] services were selected.  Venia Carbon RN, BSN, [facility] Liaison (in [place]) [phone]

## 2019-05-01 NOTE — Progress Notes (Signed)
Covid result Negative.  Ref Range & Units 00:28 7d ago 2wk ago   SARS Coronavirus 2 NEGATIVE NEGATIVE  NEGATIVE CM  NEGATIVE CM     SRP, RN

## 2019-05-01 NOTE — ED Notes (Addendum)
Patient transported to CT 

## 2019-05-01 NOTE — Progress Notes (Signed)
ANTICOAGULATION CONSULT NOTE - Initial Consult  Pharmacy Consult for Heparin Indication: pulmonary embolus  Allergies  Allergen Reactions  . Trazodone And Nefazodone Other (See Comments)    Made feel funny    Patient Measurements: Height: 5\' 3"  (160 cm) Weight: 132 lb (59.9 kg) IBW/kg (Calculated) : 52.4 Heparin Dosing Weight:   Vital Signs: Temp: 97.5 F (36.4 C) (10/20 2237) Temp Source: Oral (10/20 2237) BP: 105/76 (10/21 0118) Pulse Rate: 104 (10/21 0118)  Labs: Recent Labs    04/29/19 04/30/19 2321 05/01/19 0120  HGB 8.1* 8.3*  --   HCT  --  26.3*  --   PLT 598* 685*  --   CREATININE  --  0.93  --   TROPONINIHS  --  2 2    Estimated Creatinine Clearance: 46.6 mL/min (by C-G formula based on SCr of 0.93 mg/dL).   Medical History: Past Medical History:  Diagnosis Date  . Anxiety   . Arthritis    osteoarthritis.  . Cancer ([place])    lung  . Depression   . Hypertension     Medications:  Infusions:  . heparin      Assessment: Patient with PE.  No oral anticoagulants noted on med rec. Baseline coags ordered.  Goal of Therapy:  Heparin level 0.3-0.7 units/ml Monitor platelets by anticoagulation protocol: Yes   Plan:  Heparin bolus 2500 units iv x1 Heparin drip at 950 units/hr Daily CBC Next heparin level at [place], [place] 05/01/2019,2:24 AM

## 2019-05-01 NOTE — Progress Notes (Signed)
LM on VM for Kristen Chavez, scheduler for Dr. Alyson Ingles that patient was in hospital and had a pre-op appointment on 05/02/2019 which will be cancelled. LM on VM that patient was in hospital.

## 2019-05-01 NOTE — Consult Note (Signed)
Consultation Note Date: 05/01/2019   Patient Name: Kristen Chavez  DOB: [birth year]  MRN: [id number]  Age / Sex: 68 y.o., female  PCP: [name], MD Referring Physician: Shelly Coss, MD  Reason for Consultation: Establishing goals of care and Psychosocial/spiritual support  HPI/Patient Profile: 67 y.o. 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   She has had rapid physical and functional decline over the past month since discharge from the hospital only a few weeks ago.  Drainage from her Pleurx catheter continues to be bloody, drained by her daughter 3 times a week.    Patient and her family face treatment option decisions, advanced directive decisions and anticipatory care needs.   Clinical Assessment and Goals of Care:  This NP Wadie Lessen reviewed medical records, received report from team, assessed the patient and then meet at the patient's bedside   to discuss diagnosis, prognosis, GOC, EOL wishes disposition and options.  Patient very clearly tells me "let me go, just let me go"    She verbalizes understanding that her time is near and she wants to die at home.   She expresses great discomfort 2/2 to pain.   She is not interested in any life prolonging measures.  I then spoke to her daughter Hoyle Sauer and she supports her mother's decisions, and will be able to care for her at home with hospice services  Concept of Hospice and Palliative Care were discussed  A  discussion was had today regarding advanced directives.  Concepts specific to code status, artifical  feeding and hydration, continued IV antibiotics and rehospitalization was had.  The difference between a aggressive medical intervention path  and a palliative comfort care path for this patient at this time was had.  Values and goals of care important to patient and family were attempted to be elicited.  Natural trajectory and expectations at EOL were discussed.  Questions and concerns addressed.   Family encouraged to call with questions or concerns.    Emotional support offered  PMT will continue to support holistically.     Patient is her own decision maker with her daughter Carolyn's support   SUMMARY OF RECOMMENDATIONS    Code Status/Advance Care Planning:  DNR   Symptom Management:   Pain/Dyspnea; Roxanol 5 mg po/sl every 1 hr prn  Anxiety: Ativan 1 mg po/sl every 6 hrs prn  Palliative Prophylaxis:   Frequent Pain Assessment and Oral Care  Additional Recommendations (Limitations, Scope, Preferences):  Full Comfort Care   No artifical feeding or hydration now or in the future  No chemotherapy, diagnostics  Home with hospice  Psycho-social/Spiritual:   Desire for further Chaplaincy support:no  Additional Recommendations: Education on Hospice  Prognosis:   < 4 weeks  Discharge Planning: Home with  Hospice      Primary Diagnoses: Present on Admission: . Pulmonary embolism ([facility]) . Pleural effusion, malignant . Adenocarcinoma of right lung, stage 4 ([facility]) . Mass of urinary bladder . SIRS (systemic inflammatory response syndrome) ([facility])   I have reviewed the medical record, interviewed the patient and family, and examined the patient. The following aspects are pertinent.  Past Medical History:  Diagnosis Date  . Anxiety   . Arthritis    osteoarthritis.  . Cancer ([place])    lung  . Depression   . Hypertension    Social History   Socioeconomic History  . Marital status: Single    Spouse name: Not on file  . Number of children: Not on file  .  Years of education: Not on file  . Highest education level: Not on file  Occupational History  . Not on file  Social Needs  . Financial resource strain: Not on file  . Food insecurity    Worry: Not on file    Inability: Not on file  . Transportation needs    Medical: Not on file    Non-medical: Not on file  Tobacco Use  . Smoking status: Former Smoker    Packs/day: 0.25    Types: Cigarettes    Quit date: 04/14/2014    Years since quitting: 5.0  . Smokeless tobacco: Never Used  Substance and Sexual Activity  . Alcohol use: Not Currently    Alcohol/week: 21.0 standard drinks    Types: 21 Glasses of wine per week    Comment: socially wine  . Drug use: Not Currently    Types: Marijuana  . Sexual activity: Never  Lifestyle  . Physical activity    Days per week: Not on file    Minutes per session: Not on file  . Stress: Not on file  Relationships  . Social Herbalist on phone: Not on file    Gets together: Not on file    Attends religious service: Not on file    Active member of club or organization: Not on file    Attends meetings of clubs or organizations: Not on file    Relationship status: Not on file  Other Topics Concern  . Not on file  Social History Narrative  . Not on file   Family History  Problem Relation Age of Onset  . Hypertension Mother   . Rheum arthritis Mother   . Other Father        suicide  . Diabetes Paternal Grandmother   . Diabetes Paternal Grandfather    Scheduled Meds: . buPROPion  300 mg Oral Daily  . lidocaine  1 patch Transdermal Q24H  . prednisoLONE acetate  1 drop Left Eye QID  . sodium chloride flush  3 mL Intravenous Q12H   Continuous Infusions: . sodium chloride 75 mL/hr at 05/01/19 0859   PRN Meds:.acetaminophen **OR** acetaminophen, albuterol, HYDROcodone-acetaminophen, hydrOXYzine, LORazepam, morphine injection, morphine CONCENTRATE, ondansetron **OR** ondansetron (ZOFRAN) IV, polyethylene glycol Medications Prior  to Admission:  Prior to Admission medications   Medication Sig Start Date End Date Taking? Authorizing Provider  acetaminophen (TYLENOL) 500 MG tablet Take 1 tablet (500 mg total) by mouth every 6 (six) hours as needed for moderate pain or headache. Patient taking differently: Take 1,000 mg by mouth every 6 (six) hours as needed for moderate pain or headache.  08/18/17  Yes Hairston, Mandesia R, FNP  albuterol (VENTOLIN HFA) 108 (90 Base) MCG/ACT inhaler Inhale 1-2 puffs into the  lungs every 6 (six) hours as needed for wheezing or shortness of breath. 04/03/19  Yes Santos-Sanchez, Merlene Morse, MD  buPROPion (WELLBUTRIN XL) 300 MG 24 hr tablet TAKE 1 TABLET (300 MG TOTAL) BY MOUTH DAILY. 03/06/19  Yes [name], MD  cetirizine (ZYRTEC) 10 MG tablet Take 1 tablet (10 mg total) by mouth daily. Patient taking differently: Take 10 mg by mouth daily as needed for allergies.  01/07/19  Yes Newlin, Charlane Ferretti, MD  fluticasone (FLONASE) 50 MCG/ACT nasal spray Place 2 sprays into both nostrils daily. Patient taking differently: Place 2 sprays into both nostrils daily as needed for rhinitis.  01/07/19  Yes Charlott Rakes, MD  hydrOXYzine (ATARAX/VISTARIL) 25 MG tablet Take 1 tablet (25 mg total) by mouth 3 (three) times daily as needed. Patient taking differently: Take 25 mg by mouth 3 (three) times daily as needed for itching.  04/04/19  Yes Jeanmarie Hubert, MD  Lavender Oil OIL Apply 1 application topically 2 (two) times daily as needed (knee pain).   Yes [provider]  methotrexate (RHEUMATREX) 2.5 MG tablet Take 25 mg by mouth once a week. Take one tablet by mouth once a week on fridays.  Caution:Chemotherapy. Protect from light.   Yes [provider]  mirtazapine (REMERON) 15 MG tablet Take 1 tablet (15 mg total) by mouth at bedtime. 04/19/19  Yes Jose Persia, MD  morphine 10 MG/5ML solution Take 1.3 mLs (2.6 mg total) by mouth every 8 (eight) hours as needed (air hunger (difficulty  breathing) and anxiety). 04/26/19  Yes Jose Persia, MD  prednisoLONE acetate (PRED FORTE) 1 % ophthalmic suspension Place 1 drop into the left eye 4 (four) times daily.   Yes [provider]  benzonatate (TESSALON) 100 MG capsule Take 1 capsule (100 mg total) by mouth 3 (three) times daily as needed for cough. Patient not taking: Reported on 04/24/2019 04/19/19   Jose Persia, MD  traZODone (DESYREL) 50 MG tablet Take 0.5-1 tablets (25-50 mg total) by mouth at bedtime as needed for sleep. Patient not taking: Reported on 04/24/2019 04/19/19   Jose Persia, MD   Allergies  Allergen Reactions  . Trazodone And Nefazodone Other (See Comments)    Made feel funny   Review of Systems  Constitutional: Positive for fatigue.  Respiratory: Positive for shortness of breath.   Neurological: Positive for weakness.    Physical Exam Constitutional:      Appearance: She is cachectic. She is ill-appearing.     Interventions: Nasal cannula in place.  Cardiovascular:     Rate and Rhythm: Tachycardia present.  Pulmonary:     Breath sounds: Decreased air movement present.  Neurological:     Mental Status: She is alert.     Vital Signs: BP 97/72 (BP Location: Left Arm)   Pulse 100   Temp 98.2 F (36.8 C) (Oral)   Resp (!) 22   Ht 5\' 4"  (1.626 m)   Wt 57.4 kg   SpO2 100%   BMI 21.72 kg/m  Pain Scale: 0-10   Pain Score: 8    SpO2: SpO2: 100 % O2 Device:SpO2: 100 % O2 Flow Rate: .O2 Flow Rate (L/min): 2 L/min  IO: Intake/output summary:   Intake/Output Summary (Last 24 hours) at 05/01/2019 0959 Last data filed at 05/01/2019 0249 Gross per 24 hour  Intake 2300 ml  Output -  Net 2300 ml    LBM:   Baseline Weight: Weight: 59.9 kg Most recent weight: Weight: 57.4 kg     Palliative  Assessment/Data:   Discussed with Dr Tawanna Solo, plan for discharge home in the morning  Time In: 0800 Time Out: 0915 Time Total: 75 minutes Greater than 50%  of this time was spent  counseling and coordinating care related to the above assessment and plan.  Signed by: Wadie Lessen, NP   Please contact Palliative Medicine Team phone at [phone] for questions and concerns.  For individual provider: See Shea Evans

## 2019-05-01 NOTE — H&P (Signed)
History and Physical    ASHE GRAYBEAL KGM:[id number] DOB: 04-24-1951 DOA: 04/30/2019  PCP: [name], MD   Patient coming from: Home   Chief Complaint: Gen weakness, fatigue, SOB, nausea, loss of appetite   HPI: Kristen Chavez is a 68 y.o. 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  She also feels that her dyspnea is worsening.  She has not noted any fevers or chills at home.  She has not been vomiting but reports persistent nausea and loss of appetite.  She has a mild nonproductive cough that has not changed recently.  When asked specifically about chest pain, she reports some pleuritic pain in the upper left chest near the shoulder for the past day or so.  She denies any leg swelling or tenderness.  Drainage from her Pleurx catheter continues to be bloody, drained by her daughter 3 times a week.  Patient has not started any treatments for her cancer, but reports that she is planning to start treatment soon.  ED Course: Upon arrival to the ED, patient is found to be afebrile, saturating well on 2 L/min of supplemental oxygen, tachypneic into the low 30s, and tachycardic in the 110s.  EKG features sinus tachycardia with rate 115.  CTA chest is notable for new tiny subsegmental PE in the left upper and lower lobes, unchanged right pleural effusion and pleural masses, and no significant change in right upper lobe mass, adenopathy, and sclerotic changes to the cervical and thoracic spine.  Chemistry panel features and albumin of 2.0 and protein of 6.1.  CBC features a leukocytosis to 23,500, thrombocytosis, and stable normocytic anemia.  Lactic acid was 2.0 and high-sensitivity troponin normal x2.  Type and  screen was performed and the patient was treated with 2 L normal saline, cefepime, and vancomycin in the ED.  Blood and urine cultures were ordered and COVID-19 testing is in process.  Review of Systems:  All other systems reviewed and apart from HPI, are negative.  Past Medical History:  Diagnosis Date  . Anxiety   . Arthritis    osteoarthritis.  . Cancer ([place])    lung  . Depression   . Hypertension     Past Surgical History:  Procedure Laterality Date  . CHEST TUBE INSERTION Right 04/01/2019   Procedure: INSERTION PLEURAL DRAINAGE CATHETER;  Surgeon: [name], MD;  Location: [facility];  Service: Thoracic;  Laterality: Right;  . TONSILLECTOMY       reports that she quit smoking about 5 years ago. Her smoking use included cigarettes. She smoked 0.25 packs per day. She has never used smokeless tobacco. She reports previous alcohol use of about 21.0 standard drinks of alcohol per week. She reports previous drug use. Drug: Marijuana.  Allergies  Allergen Reactions  . Trazodone And Nefazodone Other (See Comments)    Made feel funny    Family History  Problem Relation Age of Onset  . Hypertension Mother   . Rheum arthritis Mother   . Other Father        suicide  . Diabetes Paternal Grandmother   . Diabetes Paternal Grandfather      Prior to Admission medications   Medication Sig Start Date End Date Taking? Authorizing Provider  acetaminophen (TYLENOL) 500 MG tablet Take 1  tablet (500 mg total) by mouth every 6 (six) hours as needed for moderate pain or headache. Patient taking differently: Take 1,000 mg by mouth every 6 (six) hours as needed for moderate pain or headache.  08/18/17  Yes Hairston, Maylon Peppers, FNP  albuterol (VENTOLIN HFA) 108 (90 Base) MCG/ACT inhaler Inhale 1-2 puffs into the lungs every 6 (six) hours as needed for wheezing or shortness of breath. 04/03/19  Yes Santos-Sanchez, Merlene Morse, MD  buPROPion (WELLBUTRIN XL) 300 MG 24 hr tablet TAKE 1 TABLET (300 MG  TOTAL) BY MOUTH DAILY. 03/06/19  Yes [name], MD  cetirizine (ZYRTEC) 10 MG tablet Take 1 tablet (10 mg total) by mouth daily. Patient taking differently: Take 10 mg by mouth daily as needed for allergies.  01/07/19  Yes Newlin, Charlane Ferretti, MD  fluticasone (FLONASE) 50 MCG/ACT nasal spray Place 2 sprays into both nostrils daily. Patient taking differently: Place 2 sprays into both nostrils daily as needed for rhinitis.  01/07/19  Yes Charlott Rakes, MD  hydrOXYzine (ATARAX/VISTARIL) 25 MG tablet Take 1 tablet (25 mg total) by mouth 3 (three) times daily as needed. Patient taking differently: Take 25 mg by mouth 3 (three) times daily as needed for itching.  04/04/19  Yes Jeanmarie Hubert, MD  Lavender Oil OIL Apply 1 application topically 2 (two) times daily as needed (knee pain).   Yes [provider]  methotrexate (RHEUMATREX) 2.5 MG tablet Take 25 mg by mouth once a week. Take one tablet by mouth once a week on fridays.  Caution:Chemotherapy. Protect from light.   Yes [provider]  mirtazapine (REMERON) 15 MG tablet Take 1 tablet (15 mg total) by mouth at bedtime. 04/19/19  Yes Jose Persia, MD  morphine 10 MG/5ML solution Take 1.3 mLs (2.6 mg total) by mouth every 8 (eight) hours as needed (air hunger (difficulty breathing) and anxiety). 04/26/19  Yes Jose Persia, MD  prednisoLONE acetate (PRED FORTE) 1 % ophthalmic suspension Place 1 drop into the left eye 4 (four) times daily.   Yes [provider]  benzonatate (TESSALON) 100 MG capsule Take 1 capsule (100 mg total) by mouth 3 (three) times daily as needed for cough. Patient not taking: Reported on 04/24/2019 04/19/19   Jose Persia, MD  traZODone (DESYREL) 50 MG tablet Take 0.5-1 tablets (25-50 mg total) by mouth at bedtime as needed for sleep. Patient not taking: Reported on 04/24/2019 04/19/19   Jose Persia, MD    Physical Exam: Vitals:   05/01/19 0000 05/01/19 0021 05/01/19 0118 05/01/19 0337   BP: 100/75  105/76 124/68  Pulse: (!) 104  (!) 104 (!) 105  Resp: (!) 21  (!) 23 (!) 24  Temp:      TempSrc:      SpO2: 100%  100% 100%  Weight:  59.9 kg    Height:  5\' 3"  (1.6 m)      Constitutional: NAD, calm, appears frail  Eyes: PERTLA, lids and conjunctivae normal ENMT: Mucous membranes are moist. Posterior pharynx clear of any exudate or lesions.   Neck: normal, supple, no masses, no thyromegaly Respiratory: Scattered rhonchi. Tachypneic. Speaking full sentences. No pallor or cyanosis.  Cardiovascular: S1 & S2 heard, regular rate and rhythm. No extremity edema.   Abdomen: No distension, no tenderness, soft. Bowel sounds normal.  Musculoskeletal: no clubbing / cyanosis. No joint deformity upper and lower extremities.    Skin: no significant rashes, lesions, ulcers. Warm, dry, well-perfused. Neurologic: No facial asymmetry. Sensation intact. Moving all extremities.  Psychiatric: Alert and oriented x 3. Anxious and upset regarding the current clinical situation. Cooperative.    Labs on Admission: I have personally reviewed following labs and imaging studies  CBC: Recent Labs  Lab 04/24/19 1046 04/25/19 0444 04/26/19 0614 04/29/19 04/30/19 2321  WBC  --  13.2* 13.6* 15.6 23.5*  NEUTROABS  --  9.6* 11.8*  --  19.3*  HGB 9.6* 9.2* 9.1* 8.1* 8.3*  HCT 30.5* 27.8* 29.2*  --  26.3*  MCV  --  83.5 86.6  --  85.7  PLT  --  357 425* 598* 998*   Basic Metabolic Panel: Recent Labs  Lab 04/25/19 0444 04/30/19 2321  NA 137 135  K 4.1 4.3  CL 109 103  CO2 20* 21*  GLUCOSE 84 145*  BUN 7* 18  CREATININE 0.88 0.93  CALCIUM 8.0* 8.2*   GFR: Estimated Creatinine Clearance: 46.6 mL/min (by C-G formula based on SCr of 0.93 mg/dL). Liver Function Tests: Recent Labs  Lab 04/25/19 0444 04/30/19 2321  AST 23 16  ALT 23 14  ALKPHOS 92 107  BILITOT 0.4 0.6  PROT 5.0* 6.1*  ALBUMIN 1.7* 2.0*   No results for input(s): LIPASE, AMYLASE in the last 168 hours. No results for  input(s): AMMONIA in the last 168 hours. Coagulation Profile: No results for input(s): INR, PROTIME in the last 168 hours. Cardiac Enzymes: No results for input(s): CKTOTAL, CKMB, CKMBINDEX, TROPONINI in the last 168 hours. BNP (last 3 results) No results for input(s): PROBNP in the last 8760 hours. HbA1C: No results for input(s): HGBA1C in the last 72 hours. CBG: No results for input(s): GLUCAP in the last 168 hours. Lipid Profile: No results for input(s): CHOL, HDL, LDLCALC, TRIG, CHOLHDL, LDLDIRECT in the last 72 hours. Thyroid Function Tests: No results for input(s): TSH, T4TOTAL, FREET4, T3FREE, THYROIDAB in the last 72 hours. Anemia Panel: No results for input(s): VITAMINB12, FOLATE, FERRITIN, TIBC, IRON, RETICCTPCT in the last 72 hours. Urine analysis:    Component Value Date/Time   COLORURINE YELLOW 04/24/2019 0450   APPEARANCEUR CLEAR 04/24/2019 0450   LABSPEC 1.017 04/24/2019 0450   PHURINE 6.0 04/24/2019 0450   GLUCOSEU NEGATIVE 04/24/2019 0450   HGBUR NEGATIVE 04/24/2019 [place] 04/24/2019 0450   BILIRUBINUR neg 06/26/2015 1102   [place] 04/24/2019 0450   PROTEINUR NEGATIVE 04/24/2019 0450   UROBILINOGEN 0.2 06/26/2015 1102   NITRITE NEGATIVE 04/24/2019 0450   LEUKOCYTESUR LARGE (A) 04/24/2019 0450   Sepsis Labs: @LABRCNTIP (procalcitonin:4,lacticidven:4) ) Recent Results (from the past 240 hour(s))  SARS CORONAVIRUS 2 (TAT 6-24 HRS) Nasopharyngeal Nasopharyngeal Swab     Status: None   Collection Time: 04/24/19  3:29 AM   Specimen: Nasopharyngeal Swab  Result Value Ref Range Status   SARS Coronavirus 2 NEGATIVE NEGATIVE Final    Comment: (NOTE) SARS-CoV-2 target nucleic acids are NOT DETECTED. The SARS-CoV-2 RNA is generally detectable in upper and lower respiratory specimens during the acute phase of infection. Negative results do not preclude SARS-CoV-2 infection, do not rule out co-infections with other pathogens, and  should not be used as the sole basis for treatment or other patient management decisions. Negative results must be combined with clinical observations, patient history, and epidemiological information. The expected result is Negative. Fact Sheet for Patients: SugarRoll.be Fact Sheet for Healthcare Providers: https://www.woods-mathews.com/ This test is not yet approved or cleared by the [place] FDA and  has been authorized for detection and/or diagnosis of SARS-CoV-2 by FDA under an Emergency  Use Authorization (EUA). This EUA will remain  in effect (meaning this test can be used) for the duration of the COVID-19 declaration under Section 56 4(b)(1) of the Act, 21 U.S.C. section 360bbb-3(b)(1), unless the authorization is terminated or revoked sooner. Performed at [facility], [place] [address], [place], [place] [address]   Urine culture     Status: Abnormal   Collection Time: 04/24/19  4:50 AM   Specimen: Urine, Clean Catch  Result Value Ref Range Status   Specimen Description URINE, CLEAN CATCH  Final   Special Requests   Final    NONE Performed at [facility], [place] [address], [place], [place] [address]    Culture >=100,000 COLONIES/mL ENTEROCOCCUS FAECALIS (A)  Final   Report Status 04/26/2019 FINAL  Final   Organism ID, Bacteria ENTEROCOCCUS FAECALIS (A)  Final      Susceptibility   Enterococcus faecalis - MIC*    AMPICILLIN <=2 SENSITIVE Sensitive     LEVOFLOXACIN 1 SENSITIVE Sensitive     NITROFURANTOIN <=16 SENSITIVE Sensitive     VANCOMYCIN 2 SENSITIVE Sensitive     * >=100,000 COLONIES/mL ENTEROCOCCUS FAECALIS     Radiological Exams on Admission: Dg Chest 1 View  Result Date: 04/30/2019 CLINICAL DATA:  Lung cancer weakness EXAM: CHEST  1 VIEW COMPARISON:  04/23/2019, CT 04/24/2019 FINDINGS: Right-sided chest tube remains in place with tip over the right upper lung. The left lung is grossly clear. Similar  appearance of right hilar and suprahilar mass. Diffuse hazy opacity right thorax presumably due to pleural disease and loculated fluid. IMPRESSION: Slight increased density in the right thorax may be due to increased pleural fluid or disease. Otherwise similar appearance of pleural masses, right hilar and suprahilar masses. Electronically Signed   By: Donavan Foil M.D.   On: 04/30/2019 23:19   Ct Angio Chest Pe W And/or Wo Contrast  Result Date: 05/01/2019 CLINICAL DATA:  Shortness of breath. Stage IV lung cancer. Right PleurX catheter in place. EXAM: CT ANGIOGRAPHY CHEST WITH CONTRAST TECHNIQUE: Multidetector CT imaging of the chest was performed using the standard protocol during bolus administration of intravenous contrast. Multiplanar CT image reconstructions and MIPs were obtained to evaluate the vascular anatomy. CONTRAST:  189mL OMNIPAQUE IOHEXOL 350 MG/ML SOLN COMPARISON:  Radiograph earlier this day. Chest CT 1 week ago 04/24/2019. Chest CTA 2 weeks ago 04/15/2019 FINDINGS: Cardiovascular: Tiny subsegmental pulmonary emboli in the left upper and lower lobe. No more central pulmonary emboli. The right upper lobe pulmonary arteries are encased by a right hilar adenopathy/mass. The thoracic aorta is normal in caliber. No aortic dissection. Heart is normal in size. No pericardial effusion. Mediastinum/Nodes: Lobulated right hilar mass measures 4.8 x 3.2 cm, not significantly changed. Right paramediastinal nodal conglomerate measures 3.5 x 2.7 cm, also not significantly changed. This appears contiguous with lobulated soft tissue density extending to the upper lobe. Prevascular and bilateral hilar adenopathy is not significantly changed. Decompressed esophagus. Lungs/Pleura: Right PleurX catheter in place tip posteriorly. Partially loculated malignant right pleural effusion and pleural mass are not significantly changed from prior. Diffuse right upper lobe nodularity is again seen. Right apical septal  thickening with equivocal increase. Compressive atelectasis in the right lung from pleural based nodules/masses. Pleural or pulmonary nodularity in the anterior right middle lobe. The pleural based left lower lobe pulmonary nodule on prior exam measuring 5 mm, series 7, image 87, unchanged. Right upper lobe bronchial occlusion again seen. Mass effect on the right bronchus intermedius, upper  and lower lobe bronchi again seen. Left bronchi are patent. Trachea is patent. Upper Abdomen: No acute findings. Musculoskeletal: Bony sclerosis of midthoracic vertebra, with associated degenerative change. Sclerotic cervical vertebral also seen. Review of the MIP images confirms the above findings. IMPRESSION: 1. New tiny subsegmental pulmonary emboli in the left upper and lower lobe. 2. Right PleurX catheter in place with tip in the posterior right hemithorax. No significant change in malignant right pleural effusion and pleural masses. 3. Right lung cancer with right upper lobe masses, and multifocal mediastinal and hilar adenopathy, is overall not significantly changed from prior imaging. Equivocal slight increase in septal thickening at the right lung apex. 4. Sclerotic changes in the cervical and thoracic vertebra are unchanged, indeterminate for degenerative versus metastasis. Aortic Atherosclerosis (ICD10-I70.0). Critical Value/emergent results were called by telephone at the time of interpretation on 05/01/2019 at 2:12 am to [place] , who verbally acknowledged these results. Electronically Signed   By: Keith Rake M.D.   On: 05/01/2019 02:12    EKG: Independently reviewed. Sinus tachycardia (rate 115), non-specific T-wave abnormality.   Assessment/Plan   1. Pulmonary embolism  - Presents with progressive generalized weakness, fatigue, nausea, loss of appetite, SOB, and also reports left-sided pleuritic pain, is found to have new tiny subsegmental PE's involving LUL and LLL   - She has mild  tachycardia in ED, SBP never below 90, and HS troponin is normal x2  - This presents a difficult situation given her persistently bloody pleural fluid and recent transfusion requirements  - As the PE's are tiny and subsegmental with no evidence for heart strain and no increase in her oxygen requirements, there is concern that risk of anticoagulation could outweigh the potential benefits  - Check BNP, continue cardiac monitoring, consider discussing with hematology in am    2. 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 Normocytic anemia; bloody pleural fluid  - Hgb is 8.3 on admission, stable from a few days ago  - She has had persistently bloody drainage from Pleurx catheter, has been requiring transfusions for this, and as CTS has indicated there is no intervention to stop this bleeding, she is expected to require periodic transfusions  - Follow H&H, hold anticoagulation for now as above, transfuse as needed   4. Depression   - Continue Wellbutrin, Remeron   - Ms. Nowling is understandably upset about her lung cancer and now PE's, but she denies any SI or hallucinations and is not interested in talking with chaplain or counselor   5. SIRS  - There is increased leukocytosis, tachycardia, and tachypnea noted with slightly elevated lactate  - No evidence for acute infection on chest imaging and there are no urinary complaints  - Blood cultures were collected in ED and a dose of vancomycin and cefepime were administered in ED  - Hold abx for now, follow cultures and clinical course    PPE: Mask, face shield   DVT prophylaxis: SCD's  Code Status: Full, patient undecided will admit as full code for now Family Communication: Discussed with patient  Consults called: None  Admission status: Observation  Vianne Bulls, MD [facility] Pager [phone]  If 7PM-7AM, please contact night-coverage [email or web address] Password [facility]  05/01/2019, 3:43 AM

## 2019-05-01 NOTE — Progress Notes (Signed)
A consult was received from an ED physician for Vancomycin, cefepime per pharmacy dosing.  The patient's profile has been reviewed for ht/wt/allergies/indication/available labs.   A one time order has been placed for Vancomycin 1gm iv x1, and Cefepime 2gm iv x1.  Further antibiotics/pharmacy consults should be ordered by admitting physician if indicated.                       Thank you, Nani Skillern Crowford 05/01/2019  12:03 AM

## 2019-05-02 ENCOUNTER — Encounter (HOSPITAL_COMMUNITY): Admission: RE | Admit: 2019-05-02 | Payer: Medicare Other | Source: Ambulatory Visit

## 2019-05-02 DIAGNOSIS — G893 Neoplasm related pain (acute) (chronic): Secondary | ICD-10-CM

## 2019-05-02 DIAGNOSIS — Z515 Encounter for palliative care: Secondary | ICD-10-CM

## 2019-05-02 MED ORDER — MORPHINE SULFATE (CONCENTRATE) 10 MG/0.5ML PO SOLN: 5.0000 mg | ORAL | 0 refills | Status: AC | PRN

## 2019-05-02 MED ORDER — LORAZEPAM 1 MG PO TABS: 1.0000 mg | ORAL_TABLET | ORAL | 0 refills | Status: AC | PRN

## 2019-05-02 MED ORDER — MORPHINE SULFATE (CONCENTRATE) 10 MG/0.5ML PO SOLN
10.0000 mg | ORAL | Status: DC | PRN
  Administered 2019-05-02 (×2): 10 mg via ORAL
  Filled 2019-05-02 (×2): qty 0.5

## 2019-05-02 MED ORDER — LIDOCAINE 5 % EX PTCH: 2.0000 | MEDICATED_PATCH | CUTANEOUS | 0 refills | Status: AC

## 2019-05-02 MED ORDER — ONDANSETRON HCL 4 MG PO TABS: 4.0000 mg | ORAL_TABLET | Freq: Four times a day (QID) | ORAL | 0 refills | Status: AC | PRN

## 2019-05-02 MED ORDER — POLYETHYLENE GLYCOL 3350 17 G PO PACK: 17.0000 g | PACK | Freq: Every day | ORAL | 0 refills | Status: AC | PRN

## 2019-05-02 MED FILL — LIDOCAINE PATCH 5%: 5 | 3 days supply | Qty: 6 | Fill #0

## 2019-05-02 MED FILL — ONDANSETRON HCL 4 MG TABLET: 4 | 5 days supply | Qty: 20 | Fill #0

## 2019-05-02 NOTE — Progress Notes (Signed)
Patient ID: Kristen Chavez, female   DOB: 12/08/1950, 68 y.o.   MRN: [id number]  This NP visited patient at the bedside as a follow up to  yesterday's [place].    Patient verbalizes a sense of peace and comfort with her decision focus on comfort and dignity and discharge home with hospice services.  Plan is for discharge home today.  We discussed utilization of medication for symptom management specific to pain and dyspnea.  Patient had questions regarding timeframe and prognosis.  [name] is tearful as she courageously explores her thoughts and feelings regarding her own eminent mortality.  We discussed the natural trajectory and expectations of end-of-life.  Questions and concerns addressed.  Emotional support offered.  Encourage [name] to call with questions or concerns.  Discussed with Dr Tawanna Solo  Total time spent on the unit was 25 minutes  Greater than 50% of the time was spent in counseling and coordination of care  Wadie Lessen NP  Palliative Medicine Team Team Phone # [phone] Pager [phone]

## 2019-05-02 NOTE — Discharge Summary (Signed)
Physician Discharge Summary  Kristen Chavez XNA:[id number] DOB: [birth year] DOA: 04/30/2019  PCP: [name], MD  Admit date: 04/30/2019 Discharge date: 05/02/2019  Admitted From: Home Disposition:  Home with hospice  Discharge Condition:Stable CODE STATUS: DNR Diet recommendation: Regular   Brief/Interim Summary: HPI: Kristen Chavez is a 68 y.o. 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  She also feels that her dyspnea is worsening.  She has not noted any fevers or chills at home.  She has not been vomiting but reports persistent nausea and loss of appetite.  She has a mild nonproductive cough that has not changed recently.  When asked specifically about chest pain, she reports some pleuritic pain in the upper left chest near the shoulder for the past day or so.  She denies any leg swelling or tenderness.  Drainage from her Pleurx catheter continues to be bloody, drained by her daughter 3 times a week.  Patient has not started any treatments for her cancer, but reports that she is planning to start treatment soon.  ED Course: Upon arrival to the ED, patient is found to be afebrile, saturating well on 2 L/min of supplemental oxygen, tachypneic into the low 30s, and tachycardic in the 110s.  EKG features sinus tachycardia with rate 115.  CTA chest is notable for new tiny subsegmental PE in the left upper and lower lobes, unchanged right pleural effusion and pleural masses, and no significant change in right upper lobe mass, adenopathy, and sclerotic changes to the cervical and thoracic spine.  Chemistry panel features and albumin of 2.0 and protein of 6.1.  CBC features a leukocytosis to 23,500,  thrombocytosis, and stable normocytic anemia.  Lactic acid was 2.0 and high-sensitivity troponin normal x2.  Type and screen was performed and the patient was treated with 2 L normal saline, cefepime, and vancomycin in the ED  Hospital course: Her hospital course remained stable.  She remained hemodynamically stable.  She was seen by palliative care, oncology after she was admitted.  After extensive discussion, patient and family wished to be discharged home with hospice.  Hospice will be arranged.  No plan for further escalation of treatment.  Following problems were addressed during hospitalization:   1. Pulmonary embolism  - Presents with progressive generalized weakness, fatigue, nausea, loss of appetite, SOB, and also reports left-sided pleuritic pain, is found to have new tiny subsegmental PE's involving LUL and LLL   -No plan to start on any anticoagulation therapy   2. Metastatic adenocarcinoma of right lung; malignant pleural effusion  - Patient has recent diagnosis with right lung adenocarcinoma with malignant effusion s/p Pleurx catheter placement, and has also been noted to have a urinary bladder tumor  - No plan to start chemotherapy. Patient willing to get discharged home with hospice.   3. Normocytic anemia; bloody pleural fluid  - Hgb is 8.3 on admission, stable from a few days ago  - She has had persistently bloody drainage from Pleurx catheter, has been requiring transfusions for this, and as CTS has indicated there is no intervention to stop this bleeding.  4. Depression   - Continue Wellbutrin, Remeron   - Ms. Indelicato is understandably upset about her lung cancer and now PE's, but she denies any SI or hallucinations and is  not interested in talking with chaplain or counselor   5. SIRS  - There is increased leukocytosis, tachycardia, and tachypnea noted with slightly elevated lactate  - No evidence for acute infection on chest imaging and there are no urinary  complaints  - Blood cultures were collected in ED and a dose of vancomycin and cefepime were administered in ED .Cultures did not show any growth till date.     Discharge Diagnoses:  Principal Problem:   Pulmonary embolism (HCC) Active Problems:   Pleural effusion, malignant   Adenocarcinoma of right lung, stage 4 (HCC)   Mass of urinary bladder   SIRS (systemic inflammatory response syndrome) (HCC)   Cancer associated pain   DNR (do not resuscitate)    Discharge Instructions  Discharge Instructions    Diet - low sodium heart healthy   Complete by: As directed    Discharge instructions   Complete by: As directed    1)Please take prescribed medications as instructed. 2)Follow up with home hospice.   Increase activity slowly   Complete by: As directed      Allergies as of 05/02/2019      Reactions   Trazodone And Nefazodone Other (See Comments)   Made feel funny      Medication List    STOP taking these medications   morphine 10 MG/5ML solution Replaced by: morphine CONCENTRATE 10 MG/0.5ML Soln concentrated solution     TAKE these medications   acetaminophen 500 MG tablet Commonly known as: TYLENOL Take 1 tablet (500 mg total) by mouth every 6 (six) hours as needed for moderate pain or headache. What changed: how much to take   albuterol 108 (90 Base) MCG/ACT inhaler Commonly known as: VENTOLIN HFA Inhale 1-2 puffs into the lungs every 6 (six) hours as needed for wheezing or shortness of breath.   benzonatate 100 MG capsule Commonly known as: TESSALON Take 1 capsule (100 mg total) by mouth 3 (three) times daily as needed for cough.   buPROPion 300 MG 24 hr tablet Commonly known as: WELLBUTRIN XL TAKE 1 TABLET (300 MG TOTAL) BY MOUTH DAILY.   cetirizine 10 MG tablet Commonly known as: ZYRTEC Take 1 tablet (10 mg total) by mouth daily. What changed:   when to take this  reasons to take this   fluticasone 50 MCG/ACT nasal spray Commonly known as:  FLONASE Place 2 sprays into both nostrils daily. What changed:   when to take this  reasons to take this   hydrOXYzine 25 MG tablet Commonly known as: ATARAX/VISTARIL Take 1 tablet (25 mg total) by mouth 3 (three) times daily as needed. What changed: reasons to take this   Lavender Oil Oil Apply 1 application topically 2 (two) times daily as needed (knee pain).   lidocaine 5 % Commonly known as: LIDODERM Place 2 patches onto the skin daily. Remove & Discard patch within 12 hours or as directed by MD   LORazepam 1 MG tablet Commonly known as: ATIVAN Take 1 tablet (1 mg total) by mouth every 4 (four) hours as needed for anxiety.   methotrexate 2.5 MG tablet Commonly known as: RHEUMATREX Take 25 mg by mouth once a week. Take one tablet by mouth once a week on fridays.  Caution:Chemotherapy. Protect from light.   mirtazapine 15 MG tablet Commonly known as: REMERON Take 1 tablet (15 mg total) by mouth at bedtime.   morphine CONCENTRATE 10 MG/0.5ML Soln concentrated solution Place 0.25-0.5 mLs (5-10 mg total) under the tongue every 3 (  three) hours as needed for up to 10 days for moderate pain, severe pain or shortness of breath. Replaces: morphine 10 MG/5ML solution   ondansetron 4 MG tablet Commonly known as: ZOFRAN Take 1 tablet (4 mg total) by mouth every 6 (six) hours as needed for nausea.   polyethylene glycol 17 g packet Commonly known as: MIRALAX / GLYCOLAX Take 17 g by mouth daily as needed for mild constipation.   prednisoLONE acetate 1 % ophthalmic suspension Commonly known as: PRED FORTE Place 1 drop into the left eye 4 (four) times daily.   traZODone 50 MG tablet Commonly known as: DESYREL Take 0.5-1 tablets (25-50 mg total) by mouth at bedtime as needed for sleep.       Allergies  Allergen Reactions  . Trazodone And Nefazodone Other (See Comments)    Made feel funny    Consultations:  Oncology, palliative care   Procedures/Studies: Dg Chest 1  View  Result Date: 04/30/2019 CLINICAL DATA:  Lung cancer weakness EXAM: CHEST  1 VIEW COMPARISON:  04/23/2019, CT 04/24/2019 FINDINGS: Right-sided chest tube remains in place with tip over the right upper lung. The left lung is grossly clear. Similar appearance of right hilar and suprahilar mass. Diffuse hazy opacity right thorax presumably due to pleural disease and loculated fluid. IMPRESSION: Slight increased density in the right thorax may be due to increased pleural fluid or disease. Otherwise similar appearance of pleural masses, right hilar and suprahilar masses. Electronically Signed   By: Donavan Foil M.D.   On: 04/30/2019 23:19   Dg Chest 2 View  Result Date: 04/24/2019 CLINICAL DATA:  68 year old female with shortness of breath. EXAM: CHEST - 2 VIEW COMPARISON:  Chest radiograph dated 04/15/2019 and CT dated 04/15/2019 FINDINGS: A right-sided chest tube appears to have been advanced with tip now over the posterior right upper lobe pleural surface. Loculated pleural effusion and right apical pleural based loculated effusion or masses as well as right hilar mass. Overall no significant interval change aeration of the right lung since the radiograph of 04/25/2019. The left lung remains clear. No pneumothorax. Stable cardiomediastinal silhouette. No acute osseous pathology. IMPRESSION: No significant interval change in the appearance of the loculated right pleural effusion and right hilar mass. No pneumothorax. Electronically Signed   By: Anner Crete M.D.   On: 04/24/2019 00:09   Dg Chest 2 View  Result Date: 04/15/2019 CLINICAL DATA:  Chest pain and dizziness. Lung carcinoma. EXAM: CHEST - 2 VIEW COMPARISON:  04/02/2019 FINDINGS: Heart size remains normal. Left chest tube remains in place. Moderate right pleural effusion is increased in size since previous study. No pneumothorax visualized. Multiple base masses are again seen in the right lung apex, without significant change. Mediastinal  and right hilar lymphadenopathy remains stable. Left lung remains clear. IMPRESSION: Moderate right pleural effusion, increased in size since prior study. Right chest tube remains in place. Multiple pleural base masses in right lung apex, without significant change. No significant change in mediastinal and right hilar lymphadenopathy. Electronically Signed   By: Marlaine Hind M.D.   On: 04/15/2019 13:18   Ct Chest Wo Contrast  Result Date: 04/24/2019 CLINICAL DATA:  68 year old female with pleural effusion. Recent diagnosis of stage IV lung cancer. EXAM: CT CHEST WITHOUT CONTRAST TECHNIQUE: Multidetector CT imaging of the chest was performed following the standard protocol without IV contrast. COMPARISON:  Chest radiograph dated 04/23/2019 and CT dated 04/15/2019 FINDINGS: Evaluation of this exam is limited in the absence of intravenous contrast. Cardiovascular: There  is no cardiomegaly or pericardial effusion. The thoracic aorta and central pulmonary arteries are grossly unremarkable on this noncontrast CT. Mediastinum/Nodes: Right hilar/suprahilar mass measures approximately 6.9 x 3.9 cm in greatest axial dimensions in the suprahilar region and 4.6 x 3.7 cm along the right aspect of the upper mediastinum. This is similar to prior CT. No mediastinal fluid collection. Lungs/Pleura: Right-sided pleural catheter with tip in the posterior right upper lobe pleural surface, slightly more superior compared to the prior CT. No significant interval change in the lobulated pleural based low attenuating masses/collections since the prior CT. These likely represent combination of pleural implants and loculated effusions. There is diffuse interstitial prominence and nodularity in the right upper lobe concerning for metastatic disease and lymphangitic spread of tumor. There is associated mass effect and compressive atelectasis of the majority of the right lower lobe similar to prior CT. There is a 5 mm nodule in the left  lower lobe. There is no pneumothorax. There is occlusion of the right upper lobe bronchus by the suprahilar mass. There is compression of the bronchus intermedius and right middle and right lower lobe bronchi. Upper Abdomen: No acute abnormality. Musculoskeletal: Multilevel degenerative changes of the spine. No acute osseous pathology. IMPRESSION: 1. No significant interval change in the size or appearance of the right hilar/suprahilar mass compared to the prior CT. 2. No interval change in the size or appearance of the pleural based masses and loculated collections since the prior CT. 3. Right-sided pleural catheter with tip in the posterior right upper lobe pleural surface, slightly more superior compared to the prior CT. 4. Diffuse interstitial prominence and nodularity in the right upper lobe concerning for metastatic disease and lymphangitic spread of tumor. 5. Stable compressive atelectasis of the majority of the right lower lobe. 6. A 5 mm left lower lobe pulmonary nodule. Electronically Signed   By: Anner Crete M.D.   On: 04/24/2019 18:00   Ct Angio Chest Pe W And/or Wo Contrast  Result Date: 05/01/2019 CLINICAL DATA:  Shortness of breath. Stage IV lung cancer. Right PleurX catheter in place. EXAM: CT ANGIOGRAPHY CHEST WITH CONTRAST TECHNIQUE: Multidetector CT imaging of the chest was performed using the standard protocol during bolus administration of intravenous contrast. Multiplanar CT image reconstructions and MIPs were obtained to evaluate the vascular anatomy. CONTRAST:  12mL OMNIPAQUE IOHEXOL 350 MG/ML SOLN COMPARISON:  Radiograph earlier this day. Chest CT 1 week ago 04/24/2019. Chest CTA 2 weeks ago 04/15/2019 FINDINGS: Cardiovascular: Tiny subsegmental pulmonary emboli in the left upper and lower lobe. No more central pulmonary emboli. The right upper lobe pulmonary arteries are encased by a right hilar adenopathy/mass. The thoracic aorta is normal in caliber. No aortic dissection. Heart  is normal in size. No pericardial effusion. Mediastinum/Nodes: Lobulated right hilar mass measures 4.8 x 3.2 cm, not significantly changed. Right paramediastinal nodal conglomerate measures 3.5 x 2.7 cm, also not significantly changed. This appears contiguous with lobulated soft tissue density extending to the upper lobe. Prevascular and bilateral hilar adenopathy is not significantly changed. Decompressed esophagus. Lungs/Pleura: Right PleurX catheter in place tip posteriorly. Partially loculated malignant right pleural effusion and pleural mass are not significantly changed from prior. Diffuse right upper lobe nodularity is again seen. Right apical septal thickening with equivocal increase. Compressive atelectasis in the right lung from pleural based nodules/masses. Pleural or pulmonary nodularity in the anterior right middle lobe. The pleural based left lower lobe pulmonary nodule on prior exam measuring 5 mm, series 7, image 87, unchanged. Right  upper lobe bronchial occlusion again seen. Mass effect on the right bronchus intermedius, upper and lower lobe bronchi again seen. Left bronchi are patent. Trachea is patent. Upper Abdomen: No acute findings. Musculoskeletal: Bony sclerosis of midthoracic vertebra, with associated degenerative change. Sclerotic cervical vertebral also seen. Review of the MIP images confirms the above findings. IMPRESSION: 1. New tiny subsegmental pulmonary emboli in the left upper and lower lobe. 2. Right PleurX catheter in place with tip in the posterior right hemithorax. No significant change in malignant right pleural effusion and pleural masses. 3. Right lung cancer with right upper lobe masses, and multifocal mediastinal and hilar adenopathy, is overall not significantly changed from prior imaging. Equivocal slight increase in septal thickening at the right lung apex. 4. Sclerotic changes in the cervical and thoracic vertebra are unchanged, indeterminate for degenerative versus  metastasis. Aortic Atherosclerosis (ICD10-I70.0). Critical Value/emergent results were called by telephone at the time of interpretation on 05/01/2019 at 2:12 am to [place] , who verbally acknowledged these results. Electronically Signed   By: Keith Rake M.D.   On: 05/01/2019 02:12   Ct Angio Chest Pe W And/or Wo Contrast  Result Date: 04/15/2019 CLINICAL DATA:  68 year old female with acute shortness of breath for several weeks. Recent diagnosis of stage IV lung cancer. EXAM: CT ANGIOGRAPHY CHEST WITH CONTRAST TECHNIQUE: Multidetector CT imaging of the chest was performed using the standard protocol during bolus administration of intravenous contrast. Multiplanar CT image reconstructions and MIPs were obtained to evaluate the vascular anatomy. CONTRAST:  57mL OMNIPAQUE IOHEXOL 300 MG/ML  SOLN COMPARISON:  03/27/2019 CT and prior studies. FINDINGS: Cardiovascular: This is a technically satisfactory study. No pulmonary emboli are identified. Heart size is normal. No thoracic aortic aneurysm or pericardial effusion. Mediastinum/Nodes: Bilateral mediastinal and hilar lymphadenopathy are unchanged with the index RIGHT hilar node measuring 3.6 x 4.3 cm (series 7: Image 63). No new mediastinal abnormalities are identified. Lungs/Pleura: Confluent and nodular masses within the MEDIAL RIGHT UPPER lobe again identified and unchanged with approximate measurements of 5.3 x 7.8 x 6.7 cm. This mass is again noted to case the central RIGHT pulmonary vessels. There is been interval placement of a RIGHT pleural catheter with decrease in loculated RIGHT pleural effusion but still moderate to large. Pleural nodules are again identified. Associated atelectasis within the RIGHT lung noted. Interlobular septal thickening throughout the UPPER and mid RIGHT lung noted. A 4 mm LEFT UPPER lobe nodule (8:40) is unchanged. Mild LEFT basilar atelectasis noted. No pneumothorax. Upper Abdomen: No acute abnormality.  Musculoskeletal: No acute abnormality. Sclerosis within scattered cervical, thoracic and UPPER lumbar vertebra noted and may be related to degenerative changes but nonspecific. Review of the MIP images confirms the above findings. IMPRESSION: 1. No evidence of pulmonary emboli. 2. Little significant change in RIGHT UPPER lobe mass/masses, bilateral mediastinal and hilar lymphadenopathy and pleural masses compatible with malignancy/metastatic disease. 3. RIGHT pleural catheter in place with decrease in loculated RIGHT pleural effusion since the prior study, but continues to be moderate to large. Electronically Signed   By: Margarette Canada M.D.   On: 04/15/2019 17:32       Subjective: Patient seen and examined the bedside this morning.  Hemodynamically stable for discharge.  Discharge Exam: Vitals:   05/01/19 2104 05/02/19 0515  BP: 103/80 103/75  Pulse: (!) 105 (!) 111  Resp: 18 16  Temp: 98.5 F (36.9 C) 98.5 F (36.9 C)  SpO2: 100% 96%   Vitals:   05/01/19 0837 05/01/19 1100  05/01/19 2104 05/02/19 0515  BP: 97/72 107/81 103/80 103/75  Pulse: 100 (!) 101 (!) 105 (!) 111  Resp: (!) 22 20 18 16   Temp: 98.2 F (36.8 C) 97.7 F (36.5 C) 98.5 F (36.9 C) 98.5 F (36.9 C)  TempSrc: Oral Oral Oral Oral  SpO2: 100% 99% 100% 96%  Weight: 57.4 kg     Height: 5\' 4"  (1.626 m)       General: Pt is alert, awake, not in acute distress Cardiovascular: RRR, S1/S2 +, no rubs, no gallops Respiratory: Bilateral decreased air entry Abdominal: Soft, NT, ND, bowel sounds + Extremities: no edema, no cyanosis    The results of significant diagnostics from this hospitalization (including imaging, microbiology, ancillary and laboratory) are listed below for reference.     Microbiology: Recent Results (from the past 240 hour(s))  SARS CORONAVIRUS 2 (TAT 6-24 HRS) Nasopharyngeal Nasopharyngeal Swab     Status: None   Collection Time: 04/24/19  3:29 AM   Specimen: Nasopharyngeal Swab  Result Value  Ref Range Status   SARS Coronavirus 2 NEGATIVE NEGATIVE Final    Comment: (NOTE) SARS-CoV-2 target nucleic acids are NOT DETECTED. The SARS-CoV-2 RNA is generally detectable in upper and lower respiratory specimens during the acute phase of infection. Negative results do not preclude SARS-CoV-2 infection, do not rule out co-infections with other pathogens, and should not be used as the sole basis for treatment or other patient management decisions. Negative results must be combined with clinical observations, patient history, and epidemiological information. The expected result is Negative. Fact Sheet for Patients: SugarRoll.be Fact Sheet for Healthcare Providers: https://www.woods-mathews.com/ This test is not yet approved or cleared by the [place] FDA and  has been authorized for detection and/or diagnosis of SARS-CoV-2 by FDA under an Emergency Use Authorization (EUA). This EUA will remain  in effect (meaning this test can be used) for the duration of the COVID-19 declaration under Section 56 4(b)(1) of the Act, 21 U.S.C. section 360bbb-3(b)(1), unless the authorization is terminated or revoked sooner. Performed at [facility], [place] [address] [place], [place] [address]   Urine culture     Status: Abnormal   Collection Time: 04/24/19  4:50 AM   Specimen: Urine, Clean Catch  Result Value Ref Range Status   Specimen Description URINE, CLEAN CATCH  Final   Special Requests   Final    NONE Performed at [facility], [place] [address] [place], [place] [address]    Culture >=100,000 COLONIES/mL ENTEROCOCCUS FAECALIS (A)  Final   Report Status 04/26/2019 FINAL  Final   Organism ID, Bacteria ENTEROCOCCUS FAECALIS (A)  Final      Susceptibility   Enterococcus faecalis - MIC*    AMPICILLIN <=2 SENSITIVE Sensitive     LEVOFLOXACIN 1 SENSITIVE Sensitive     NITROFURANTOIN <=16 SENSITIVE Sensitive     VANCOMYCIN 2 SENSITIVE  Sensitive     * >=100,000 COLONIES/mL ENTEROCOCCUS FAECALIS  Blood Culture (routine x 2)     Status: None (Preliminary result)   Collection Time: 04/30/19 11:21 PM   Specimen: BLOOD  Result Value Ref Range Status   Specimen Description   Final    BLOOD LEFT ANTECUBITAL Performed at [facility] [address] [place], [place] [address]    Special Requests   Final    BOTTLES DRAWN AEROBIC AND ANAEROBIC Blood Culture adequate volume Performed at [facility] [address], [place], [place] [address]    Culture  Final    NO GROWTH < 24 HOURS Performed at [facility], [place] [address] [place], [place] [address]    Report Status PENDING  Incomplete  Blood Culture (routine x 2)     Status: None (Preliminary result)   Collection Time: 05/01/19 12:21 AM   Specimen: BLOOD  Result Value Ref Range Status   Specimen Description   Final    BLOOD RIGHT ANTECUBITAL Performed at [facility] [address] [place], [place] [address]    Special Requests   Final    BOTTLES DRAWN AEROBIC AND ANAEROBIC Blood Culture results may not be optimal due to an excessive volume of blood received in culture bottles Performed at [facility] [address], [place], [place] [address]    Culture   Final    NO GROWTH 1 DAY Performed at [facility], [place] [address], [place], [place] [address]    Report Status PENDING  Incomplete  SARS CORONAVIRUS 2 (TAT 6-24 HRS) Nasopharyngeal Nasopharyngeal Swab     Status: None   Collection Time: 05/01/19 12:28 AM   Specimen: Nasopharyngeal Swab  Result Value Ref Range Status   SARS Coronavirus 2 NEGATIVE NEGATIVE Final    Comment: (NOTE) SARS-CoV-2 target nucleic acids are NOT DETECTED. The SARS-CoV-2 RNA is generally detectable in upper and lower respiratory specimens during the acute phase of infection. Negative results do not preclude SARS-CoV-2 infection, do not rule  out co-infections with other pathogens, and should not be used as the sole basis for treatment or other patient management decisions. Negative results must be combined with clinical observations, patient history, and epidemiological information. The expected result is Negative. Fact Sheet for Patients: SugarRoll.be Fact Sheet for Healthcare Providers: https://www.woods-mathews.com/ This test is not yet approved or cleared by the [place] FDA and  has been authorized for detection and/or diagnosis of SARS-CoV-2 by FDA under an Emergency Use Authorization (EUA). This EUA will remain  in effect (meaning this test can be used) for the duration of the COVID-19 declaration under Section 56 4(b)(1) of the Act, 21 U.S.C. section 360bbb-3(b)(1), unless the authorization is terminated or revoked sooner. Performed at [facility], [place] [address], [place], [place] [address]      Labs: BNP (last 3 results) Recent Labs    04/23/19 2334 04/30/19 2321  BNP 20.1 21.2   Basic Metabolic Panel: Recent Labs  Lab 04/30/19 2321  NA 135  K 4.3  CL 103  CO2 21*  GLUCOSE 145*  BUN 18  CREATININE 0.93  CALCIUM 8.2*   Liver Function Tests: Recent Labs  Lab 04/30/19 2321  AST 16  ALT 14  ALKPHOS 107  BILITOT 0.6  PROT 6.1*  ALBUMIN 2.0*   No results for input(s): LIPASE, AMYLASE in the last 168 hours. No results for input(s): AMMONIA in the last 168 hours. CBC: Recent Labs  Lab 04/26/19 0614 04/29/19 04/30/19 2321  WBC 13.6* 15.6 23.5*  NEUTROABS 11.8*  --  19.3*  HGB 9.1* 8.1* 8.3*  HCT 29.2*  --  26.3*  MCV 86.6  --  85.7  PLT 425* 598* 685*   Cardiac Enzymes: No results for input(s): CKTOTAL, CKMB, CKMBINDEX, TROPONINI in the last 168 hours. BNP: Invalid input(s): POCBNP CBG: Recent Labs  Lab 05/01/19 0756  GLUCAP 96   D-Dimer No results for input(s): DDIMER in the last 72 hours. Hgb A1c No results for  input(s): HGBA1C in the last 72 hours. Lipid  Profile No results for input(s): CHOL, HDL, LDLCALC, TRIG, CHOLHDL, LDLDIRECT in the last 72 hours. Thyroid function studies No results for input(s): TSH, T4TOTAL, T3FREE, THYROIDAB in the last 72 hours.  Invalid input(s): FREET3 Anemia work up No results for input(s): VITAMINB12, FOLATE, FERRITIN, TIBC, IRON, RETICCTPCT in the last 72 hours. Urinalysis    Component Value Date/Time   COLORURINE YELLOW 05/01/2019 1657   APPEARANCEUR CLEAR 05/01/2019 1657   LABSPEC 1.044 (H) 05/01/2019 1657   PHURINE 6.0 05/01/2019 1657   GLUCOSEU NEGATIVE 05/01/2019 1657   HGBUR NEGATIVE 05/01/2019 1657   BILIRUBINUR NEGATIVE 05/01/2019 1657   BILIRUBINUR neg 06/26/2015 1102   KETONESUR NEGATIVE 05/01/2019 1657   PROTEINUR NEGATIVE 05/01/2019 1657   UROBILINOGEN 0.2 06/26/2015 1102   NITRITE NEGATIVE 05/01/2019 1657   LEUKOCYTESUR SMALL (A) 05/01/2019 1657   Sepsis Labs Invalid input(s): PROCALCITONIN,  WBC,  LACTICIDVEN Microbiology Recent Results (from the past 240 hour(s))  SARS CORONAVIRUS 2 (TAT 6-24 HRS) Nasopharyngeal Nasopharyngeal Swab     Status: None   Collection Time: 04/24/19  3:29 AM   Specimen: Nasopharyngeal Swab  Result Value Ref Range Status   SARS Coronavirus 2 NEGATIVE NEGATIVE Final    Comment: (NOTE) SARS-CoV-2 target nucleic acids are NOT DETECTED. The SARS-CoV-2 RNA is generally detectable in upper and lower respiratory specimens during the acute phase of infection. Negative results do not preclude SARS-CoV-2 infection, do not rule out co-infections with other pathogens, and should not be used as the sole basis for treatment or other patient management decisions. Negative results must be combined with clinical observations, patient history, and epidemiological information. The expected result is Negative. Fact Sheet for Patients: SugarRoll.be Fact Sheet for Healthcare  Providers: https://www.woods-mathews.com/ This test is not yet approved or cleared by the [place] FDA and  has been authorized for detection and/or diagnosis of SARS-CoV-2 by FDA under an Emergency Use Authorization (EUA). This EUA will remain  in effect (meaning this test can be used) for the duration of the COVID-19 declaration under Section 56 4(b)(1) of the Act, 21 U.S.C. section 360bbb-3(b)(1), unless the authorization is terminated or revoked sooner. Performed at [facility], [place] [address], [place], [place] [address]   Urine culture     Status: Abnormal   Collection Time: 04/24/19  4:50 AM   Specimen: Urine, Clean Catch  Result Value Ref Range Status   Specimen Description URINE, CLEAN CATCH  Final   Special Requests   Final    NONE Performed at [facility], [place] [address] [place], [place] [address]    Culture >=100,000 COLONIES/mL ENTEROCOCCUS FAECALIS (A)  Final   Report Status 04/26/2019 FINAL  Final   Organism ID, Bacteria ENTEROCOCCUS FAECALIS (A)  Final      Susceptibility   Enterococcus faecalis - MIC*    AMPICILLIN <=2 SENSITIVE Sensitive     LEVOFLOXACIN 1 SENSITIVE Sensitive     NITROFURANTOIN <=16 SENSITIVE Sensitive     VANCOMYCIN 2 SENSITIVE Sensitive     * >=100,000 COLONIES/mL ENTEROCOCCUS FAECALIS  Blood Culture (routine x 2)     Status: None (Preliminary result)   Collection Time: 04/30/19 11:21 PM   Specimen: BLOOD  Result Value Ref Range Status   Specimen Description   Final    BLOOD LEFT ANTECUBITAL Performed at [facility] [address] [place], [place] [address]    Special Requests   Final    BOTTLES DRAWN AEROBIC AND ANAEROBIC Blood Culture adequate volume Performed  at [facility], [place] [address] [place], [place] [address]    Culture   Final    NO GROWTH < 24 HOURS Performed at [facility] [address], [place], [place] [address]    Report Status PENDING   Incomplete  Blood Culture (routine x 2)     Status: None (Preliminary result)   Collection Time: 05/01/19 12:21 AM   Specimen: BLOOD  Result Value Ref Range Status   Specimen Description   Final    BLOOD RIGHT ANTECUBITAL Performed at [facility] [address] [place], [place] [address]    Special Requests   Final    BOTTLES DRAWN AEROBIC AND ANAEROBIC Blood Culture results may not be optimal due to an excessive volume of blood received in culture bottles Performed at [facility] [address], [place], [place] [address]    Culture   Final    NO GROWTH 1 DAY Performed at [facility], [place] [address] [place], [place] [address]    Report Status PENDING  Incomplete  SARS CORONAVIRUS 2 (TAT 6-24 HRS) Nasopharyngeal Nasopharyngeal Swab     Status: None   Collection Time: 05/01/19 12:28 AM   Specimen: Nasopharyngeal Swab  Result Value Ref Range Status   SARS Coronavirus 2 NEGATIVE NEGATIVE Final    Comment: (NOTE) SARS-CoV-2 target nucleic acids are NOT DETECTED. The SARS-CoV-2 RNA is generally detectable in upper and lower respiratory specimens during the acute phase of infection. Negative results do not preclude SARS-CoV-2 infection, do not rule out co-infections with other pathogens, and should not be used as the sole basis for treatment or other patient management decisions. Negative results must be combined with clinical observations, patient history, and epidemiological information. The expected result is Negative. Fact Sheet for Patients: SugarRoll.be Fact Sheet for Healthcare Providers: https://www.woods-mathews.com/ This test is not yet approved or cleared by the [place] FDA and  has been authorized for detection and/or diagnosis of SARS-CoV-2 by FDA under an Emergency Use Authorization (EUA). This EUA will remain  in effect (meaning this test can be used) for the duration of  the COVID-19 declaration under Section 56 4(b)(1) of the Act, 21 U.S.C. section 360bbb-3(b)(1), unless the authorization is terminated or revoked sooner. Performed at [facility], [place] [address] [place], [place] [address]     Please note: You were cared for by a hospitalist during your hospital stay. Once you are discharged, your primary care physician will handle any further medical issues. Please note that NO REFILLS for any discharge medications will be authorized once you are discharged, as it is imperative that you return to your primary care physician (or establish a relationship with a primary care physician if you do not have one) for your post hospital discharge needs so that they can reassess your need for medications and monitor your lab values.    Time coordinating discharge: 40 minutes  SIGNED:   Shelly Coss, MD  [facility] 05/02/2019, 10:27 AM Pager [phone]  If 7PM-7AM, please contact night-coverage [email or web address] Password [email or web address]

## 2019-05-02 NOTE — Progress Notes (Signed)
Manufacturing engineer [facility])  Received referral for home hospice from [facility].    Reached out to daughter twice with no answer, left messages with call back information.  [facility] will continue to reach out and follow up with pt and family at home to begin hospice services.  Please fax d/c summary to [phone]  Please send any comfort prescriptions that may be needed and completed DNR.  Thank you, Venia Carbon RN, BSN, [facility] Liaison (in [place]) [phone]

## 2019-05-02 NOTE — TOC Initial Note (Signed)
Transition of Care [facility]) - Initial/Assessment Note    Patient Details  Name: Kristen Chavez MRN: [id number] Date of Birth: 1950-09-02  Transition of Care [facility]) CM/SW Contact:    Purcell Mouton, RN Phone Number: 05/02/2019, 10:52 AM  Clinical Narrative:                 Spoke with pt's daughter Chrys Racer [phone] concerning discharge home with hospice.  Daughter selected [facility]. Referral called to Anderson Malta, RN.   Expected Discharge Plan: [place]     Patient Goals and CMS Choice Patient states their goals for this hospitalization and ongoing recovery are:: To go home.   Choice offered to / list presented to : Adult Children  Expected Discharge Plan and Services Expected Discharge Plan: [place]   Discharge Planning Services: CM Consult Post Acute Care Choice: Hospice Living arrangements for the past 2 months: Single Family Home Expected Discharge Date: 05/02/19                         HH Arranged: RN [facility] Agency: [facility] Date [facility] Agency Contacted: 05/02/19 Time HH Agency Contacted: 48 Representative spoke with at [place]: Anderson Malta, RN  Prior Living Arrangements/Services Living arrangements for the past 2 months: Single Family Home Lives with:: Adult Children   Do you feel safe going back to the place where you live?: Yes      Need for Family Participation in Patient Care: Yes (Comment) Care giver support system in place?: Yes (comment)      Activities of Daily Living Home Assistive Devices/Equipment: Hand-held shower hose, Walker (specify type) ADL Screening (condition at time of admission) Patient's cognitive ability adequate to safely complete daily activities?: Yes Is the patient deaf or have difficulty hearing?: No Does the patient have difficulty seeing, even when wearing glasses/contacts?: No Does the patient have difficulty concentrating,  remembering, or making decisions?: No Patient able to express need for assistance with ADLs?: Yes Does the patient have difficulty dressing or bathing?: No Independently performs ADLs?: Yes (appropriate for developmental age) Does the patient have difficulty walking or climbing stairs?: Yes Weakness of Legs: Both Weakness of Arms/Hands: None  Permission Sought/Granted Permission sought to share information with : Case Manager                Emotional Assessment Appearance:: Appears stated age     Orientation: : Oriented to Self, Oriented to Place, Oriented to  Time, Oriented to Situation      Admission diagnosis:  Malignant pleural effusion [J91.0] 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 Pulmonary embolism ([place]) 05/01/2019  . Mass of urinary bladder 05/01/2019  . SIRS (systemic inflammatory response syndrome) ([place]) 05/01/2019  . Cancer associated pain   . DNR (do not resuscitate)   . Symptomatic anemia 04/24/2019  . Adenocarcinoma of right lung, stage 4 ([place])   . Counseling regarding advance care planning and goals of care   . Malignant pleural effusion   . Anemia 04/15/2019  . Shortness of breath   . DNR (do not resuscitate) discussion   . Palliative care by specialist   . Pleural effusion, malignant   . Adenocarcinoma ([place]) 03/29/2019  . Mass of upper lobe of right lung 03/27/2019  . Pleural  effusion   . Corn of toe 12/11/2017  . Weight loss, unintentional 12/11/2017  . Marijuana use 12/11/2017  . Smoking 08/26/2013  . ANXIETY 11/25/2007  . DEPRESSION 11/25/2007  . Essential hypertension 11/25/2007  . Osteoarthritis 11/25/2007  . 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   PCP:  [name], MD Pharmacy:   [place] [place]  [address] [place] [address] Phone: [phone] Fax: [place] [place] [address] Phone: [phone] Fax: [phone]     Social Determinants of Health (SDOH) Interventions    Readmission Risk Interventions Readmission Risk Prevention Plan 04/25/2019 04/25/2019 04/19/2019  Transportation Screening - Complete Complete  PCP or Specialist Appt within 3-5 Days - Complete Complete  HRI or Home Care Consult - Complete Complete  Social Work Consult for [place] Planning/Counseling - (No Data) -  Palliative Care Screening Complete (No Data) Complete  Medication Review Press photographer) - - Complete  Some recent data might be hidden

## 2019-05-02 NOTE — Plan of Care (Signed)
  Problem: Education: Goal: Knowledge of General Education information will improve Description: Including pain rating scale, medication(s)/side effects and non-pharmacologic comfort measures Outcome: Adequate for Discharge   Problem: Clinical Measurements: Goal: Will remain free from infection Outcome: Adequate for Discharge

## 2019-05-03 DIAGNOSIS — J302 Other seasonal allergic rhinitis: Secondary | ICD-10-CM | POA: Diagnosis not present

## 2019-05-03 DIAGNOSIS — D630 Anemia in neoplastic disease: Secondary | ICD-10-CM | POA: Diagnosis not present

## 2019-05-03 DIAGNOSIS — N3289 Other specified disorders of bladder: Secondary | ICD-10-CM | POA: Diagnosis not present

## 2019-05-03 DIAGNOSIS — I10 Essential (primary) hypertension: Secondary | ICD-10-CM | POA: Diagnosis not present

## 2019-05-03 DIAGNOSIS — C3491 Malignant neoplasm of unspecified part of right bronchus or lung: Secondary | ICD-10-CM | POA: Diagnosis not present

## 2019-05-03 DIAGNOSIS — Z87891 Personal history of nicotine dependence: Secondary | ICD-10-CM | POA: Diagnosis not present

## 2019-05-03 DIAGNOSIS — I2699 Other pulmonary embolism without acute cor pulmonale: Secondary | ICD-10-CM | POA: Diagnosis not present

## 2019-05-03 DIAGNOSIS — F418 Other specified anxiety disorders: Secondary | ICD-10-CM | POA: Diagnosis not present

## 2019-05-03 DIAGNOSIS — H3320 Serous retinal detachment, unspecified eye: Secondary | ICD-10-CM | POA: Diagnosis not present

## 2019-05-03 DIAGNOSIS — J910 Malignant pleural effusion: Secondary | ICD-10-CM | POA: Diagnosis not present

## 2019-05-03 MED FILL — ALBUTEROL 0.083 MG/ML SOLN: (2.5 MG/3ML | 15 days supply | Qty: 180 | Fill #0

## 2019-05-03 MED FILL — LIDOCAINE PAIN RELIEF 4 % P: 4 | 15 days supply | Qty: 30 | Fill #0

## 2019-05-04 LAB — URINE CULTURE: Culture: 50000 — AB

## 2019-05-06 ENCOUNTER — Ambulatory Visit (HOSPITAL_COMMUNITY): Admission: RE | Admit: 2019-05-06 | Payer: Self-pay | Source: Home / Self Care | Admitting: Urology

## 2019-05-06 ENCOUNTER — Telehealth: Payer: Self-pay | Admitting: Internal Medicine

## 2019-05-06 ENCOUNTER — Other Ambulatory Visit: Payer: Self-pay | Admitting: Internal Medicine

## 2019-05-06 ENCOUNTER — Encounter (HOSPITAL_COMMUNITY): Admission: RE | Payer: Self-pay | Source: Home / Self Care

## 2019-05-06 DIAGNOSIS — J910 Malignant pleural effusion: Secondary | ICD-10-CM | POA: Diagnosis not present

## 2019-05-06 DIAGNOSIS — C3491 Malignant neoplasm of unspecified part of right bronchus or lung: Secondary | ICD-10-CM | POA: Diagnosis not present

## 2019-05-06 DIAGNOSIS — N3289 Other specified disorders of bladder: Secondary | ICD-10-CM | POA: Diagnosis not present

## 2019-05-06 DIAGNOSIS — I2699 Other pulmonary embolism without acute cor pulmonale: Secondary | ICD-10-CM | POA: Diagnosis not present

## 2019-05-06 DIAGNOSIS — D630 Anemia in neoplastic disease: Secondary | ICD-10-CM | POA: Diagnosis not present

## 2019-05-06 DIAGNOSIS — F418 Other specified anxiety disorders: Secondary | ICD-10-CM | POA: Diagnosis not present

## 2019-05-06 LAB — CULTURE, BLOOD (ROUTINE X 2)
Culture: NO GROWTH
Culture: NO GROWTH
Special Requests: ADEQUATE

## 2019-05-06 SURGERY — TURBT (TRANSURETHRAL RESECTION OF BLADDER TUMOR)
Anesthesia: General

## 2019-05-06 MED ORDER — LEVOFLOXACIN 500 MG PO TABS: 500.0000 mg | ORAL_TABLET | Freq: Every day | ORAL | 0 refills | Status: AC

## 2019-05-06 MED FILL — levoFLOXacin 500 MG TABS: 500 | 7 days supply | Qty: 7 | Fill #0

## 2019-05-06 NOTE — Telephone Encounter (Signed)
Phone call placed to patient's cell phone this morning.  I left a voicemail letting her know that I got the results of the urine culture that was done during her recent hospitalization that showed she has an infection.  I told her that I will be sending a prescription for some antibiotics to her pharmacy for pickup.  I also tried calling the patient's daughter whose phone number is on the chart but did not get an answer.

## 2019-05-08 ENCOUNTER — Ambulatory Visit: Payer: Self-pay | Admitting: Cardiothoracic Surgery

## 2019-05-09 DIAGNOSIS — D630 Anemia in neoplastic disease: Secondary | ICD-10-CM | POA: Diagnosis not present

## 2019-05-09 DIAGNOSIS — I2699 Other pulmonary embolism without acute cor pulmonale: Secondary | ICD-10-CM | POA: Diagnosis not present

## 2019-05-09 DIAGNOSIS — C3491 Malignant neoplasm of unspecified part of right bronchus or lung: Secondary | ICD-10-CM | POA: Diagnosis not present

## 2019-05-09 DIAGNOSIS — J910 Malignant pleural effusion: Secondary | ICD-10-CM | POA: Diagnosis not present

## 2019-05-09 DIAGNOSIS — N3289 Other specified disorders of bladder: Secondary | ICD-10-CM | POA: Diagnosis not present

## 2019-05-09 DIAGNOSIS — F418 Other specified anxiety disorders: Secondary | ICD-10-CM | POA: Diagnosis not present

## 2019-05-10 LAB — ACID FAST CULTURE WITH REFLEXED SENSITIVITIES (MYCOBACTERIA): Acid Fast Culture: NEGATIVE

## 2019-05-10 MED FILL — MORPHINE SULF 100 MG/5 ML S: 100 | 10 days supply | Qty: 30 | Fill #0

## 2019-05-11 DIAGNOSIS — I2699 Other pulmonary embolism without acute cor pulmonale: Secondary | ICD-10-CM | POA: Diagnosis not present

## 2019-05-11 DIAGNOSIS — J910 Malignant pleural effusion: Secondary | ICD-10-CM | POA: Diagnosis not present

## 2019-05-11 DIAGNOSIS — D630 Anemia in neoplastic disease: Secondary | ICD-10-CM | POA: Diagnosis not present

## 2019-05-11 DIAGNOSIS — F418 Other specified anxiety disorders: Secondary | ICD-10-CM | POA: Diagnosis not present

## 2019-05-11 DIAGNOSIS — C3491 Malignant neoplasm of unspecified part of right bronchus or lung: Secondary | ICD-10-CM | POA: Diagnosis not present

## 2019-05-11 DIAGNOSIS — N3289 Other specified disorders of bladder: Secondary | ICD-10-CM | POA: Diagnosis not present

## 2019-05-12 DIAGNOSIS — F418 Other specified anxiety disorders: Secondary | ICD-10-CM | POA: Diagnosis not present

## 2019-05-12 DIAGNOSIS — J302 Other seasonal allergic rhinitis: Secondary | ICD-10-CM | POA: Diagnosis not present

## 2019-05-12 DIAGNOSIS — I10 Essential (primary) hypertension: Secondary | ICD-10-CM | POA: Diagnosis not present

## 2019-05-12 DIAGNOSIS — N3289 Other specified disorders of bladder: Secondary | ICD-10-CM | POA: Diagnosis not present

## 2019-05-12 DIAGNOSIS — H3320 Serous retinal detachment, unspecified eye: Secondary | ICD-10-CM | POA: Diagnosis not present

## 2019-05-12 DIAGNOSIS — I2699 Other pulmonary embolism without acute cor pulmonale: Secondary | ICD-10-CM | POA: Diagnosis not present

## 2019-05-12 DIAGNOSIS — D630 Anemia in neoplastic disease: Secondary | ICD-10-CM | POA: Diagnosis not present

## 2019-05-12 DIAGNOSIS — Z87891 Personal history of nicotine dependence: Secondary | ICD-10-CM | POA: Diagnosis not present

## 2019-05-12 DIAGNOSIS — C3491 Malignant neoplasm of unspecified part of right bronchus or lung: Secondary | ICD-10-CM | POA: Diagnosis not present

## 2019-05-12 DIAGNOSIS — J910 Malignant pleural effusion: Secondary | ICD-10-CM | POA: Diagnosis not present

## 2019-05-14 ENCOUNTER — Telehealth: Payer: Self-pay

## 2019-05-14 NOTE — Telephone Encounter (Signed)
Call placed to [facility] regarding request for signed orders for home health services.  Spoke to [place] who stated that the orders are for home visit made 04/29/2019.  They then discharged the patient 04/30/2019 when she was re-admitted. She was subsequently discharged from the hospital  to hospice care.

## 2019-05-17 DIAGNOSIS — C3491 Malignant neoplasm of unspecified part of right bronchus or lung: Secondary | ICD-10-CM | POA: Diagnosis not present

## 2019-05-17 DIAGNOSIS — J910 Malignant pleural effusion: Secondary | ICD-10-CM | POA: Diagnosis not present

## 2019-05-17 DIAGNOSIS — F418 Other specified anxiety disorders: Secondary | ICD-10-CM | POA: Diagnosis not present

## 2019-05-17 DIAGNOSIS — N3289 Other specified disorders of bladder: Secondary | ICD-10-CM | POA: Diagnosis not present

## 2019-05-17 DIAGNOSIS — I2699 Other pulmonary embolism without acute cor pulmonale: Secondary | ICD-10-CM | POA: Diagnosis not present

## 2019-05-17 DIAGNOSIS — D630 Anemia in neoplastic disease: Secondary | ICD-10-CM | POA: Diagnosis not present

## 2019-05-17 MED FILL — levoFLOXacin 500 MG TABS: 500 | 7 days supply | Qty: 7 | Fill #0

## 2019-05-17 MED FILL — LORazepam 1 MG TABS: 1 | 30 days supply | Qty: 180 | Fill #0

## 2019-05-24 DIAGNOSIS — N3289 Other specified disorders of bladder: Secondary | ICD-10-CM | POA: Diagnosis not present

## 2019-05-24 DIAGNOSIS — J910 Malignant pleural effusion: Secondary | ICD-10-CM | POA: Diagnosis not present

## 2019-05-24 DIAGNOSIS — C3491 Malignant neoplasm of unspecified part of right bronchus or lung: Secondary | ICD-10-CM | POA: Diagnosis not present

## 2019-05-24 DIAGNOSIS — F418 Other specified anxiety disorders: Secondary | ICD-10-CM | POA: Diagnosis not present

## 2019-05-24 DIAGNOSIS — I2699 Other pulmonary embolism without acute cor pulmonale: Secondary | ICD-10-CM | POA: Diagnosis not present

## 2019-05-24 DIAGNOSIS — D630 Anemia in neoplastic disease: Secondary | ICD-10-CM | POA: Diagnosis not present

## 2019-05-24 MED FILL — BUPROPION HCL ER (XL) 300 M: 300 | 30 days supply | Qty: 30 | Fill #0

## 2019-05-31 DIAGNOSIS — J910 Malignant pleural effusion: Secondary | ICD-10-CM | POA: Diagnosis not present

## 2019-05-31 DIAGNOSIS — C3491 Malignant neoplasm of unspecified part of right bronchus or lung: Secondary | ICD-10-CM | POA: Diagnosis not present

## 2019-05-31 DIAGNOSIS — F418 Other specified anxiety disorders: Secondary | ICD-10-CM | POA: Diagnosis not present

## 2019-05-31 DIAGNOSIS — I2699 Other pulmonary embolism without acute cor pulmonale: Secondary | ICD-10-CM | POA: Diagnosis not present

## 2019-05-31 DIAGNOSIS — N3289 Other specified disorders of bladder: Secondary | ICD-10-CM | POA: Diagnosis not present

## 2019-05-31 DIAGNOSIS — D630 Anemia in neoplastic disease: Secondary | ICD-10-CM | POA: Diagnosis not present

## 2019-06-02 DIAGNOSIS — I2699 Other pulmonary embolism without acute cor pulmonale: Secondary | ICD-10-CM | POA: Diagnosis not present

## 2019-06-02 DIAGNOSIS — N3289 Other specified disorders of bladder: Secondary | ICD-10-CM | POA: Diagnosis not present

## 2019-06-02 DIAGNOSIS — J910 Malignant pleural effusion: Secondary | ICD-10-CM | POA: Diagnosis not present

## 2019-06-02 DIAGNOSIS — D630 Anemia in neoplastic disease: Secondary | ICD-10-CM | POA: Diagnosis not present

## 2019-06-02 DIAGNOSIS — F418 Other specified anxiety disorders: Secondary | ICD-10-CM | POA: Diagnosis not present

## 2019-06-02 DIAGNOSIS — C3491 Malignant neoplasm of unspecified part of right bronchus or lung: Secondary | ICD-10-CM | POA: Diagnosis not present

## 2019-06-05 ENCOUNTER — Telehealth: Payer: Self-pay | Admitting: Internal Medicine

## 2019-06-05 NOTE — Telephone Encounter (Signed)
[employer] dropped off death certificate for Dr. Margarita Rana to sign. Nurse Elinor Parkinson gave the death certificate to Dr. Margarita Rana.

## 2019-06-11 DEATH — deceased

## 2020-05-28 IMAGING — DX DG CHEST 2V
2 series · 2 of 2 positions shown · non-contrast
Comparison: 04/02/2019

CLINICAL DATA: Chest pain and dizziness. Lung carcinoma.

EXAM:
CHEST - 2 VIEW

[chest pa]
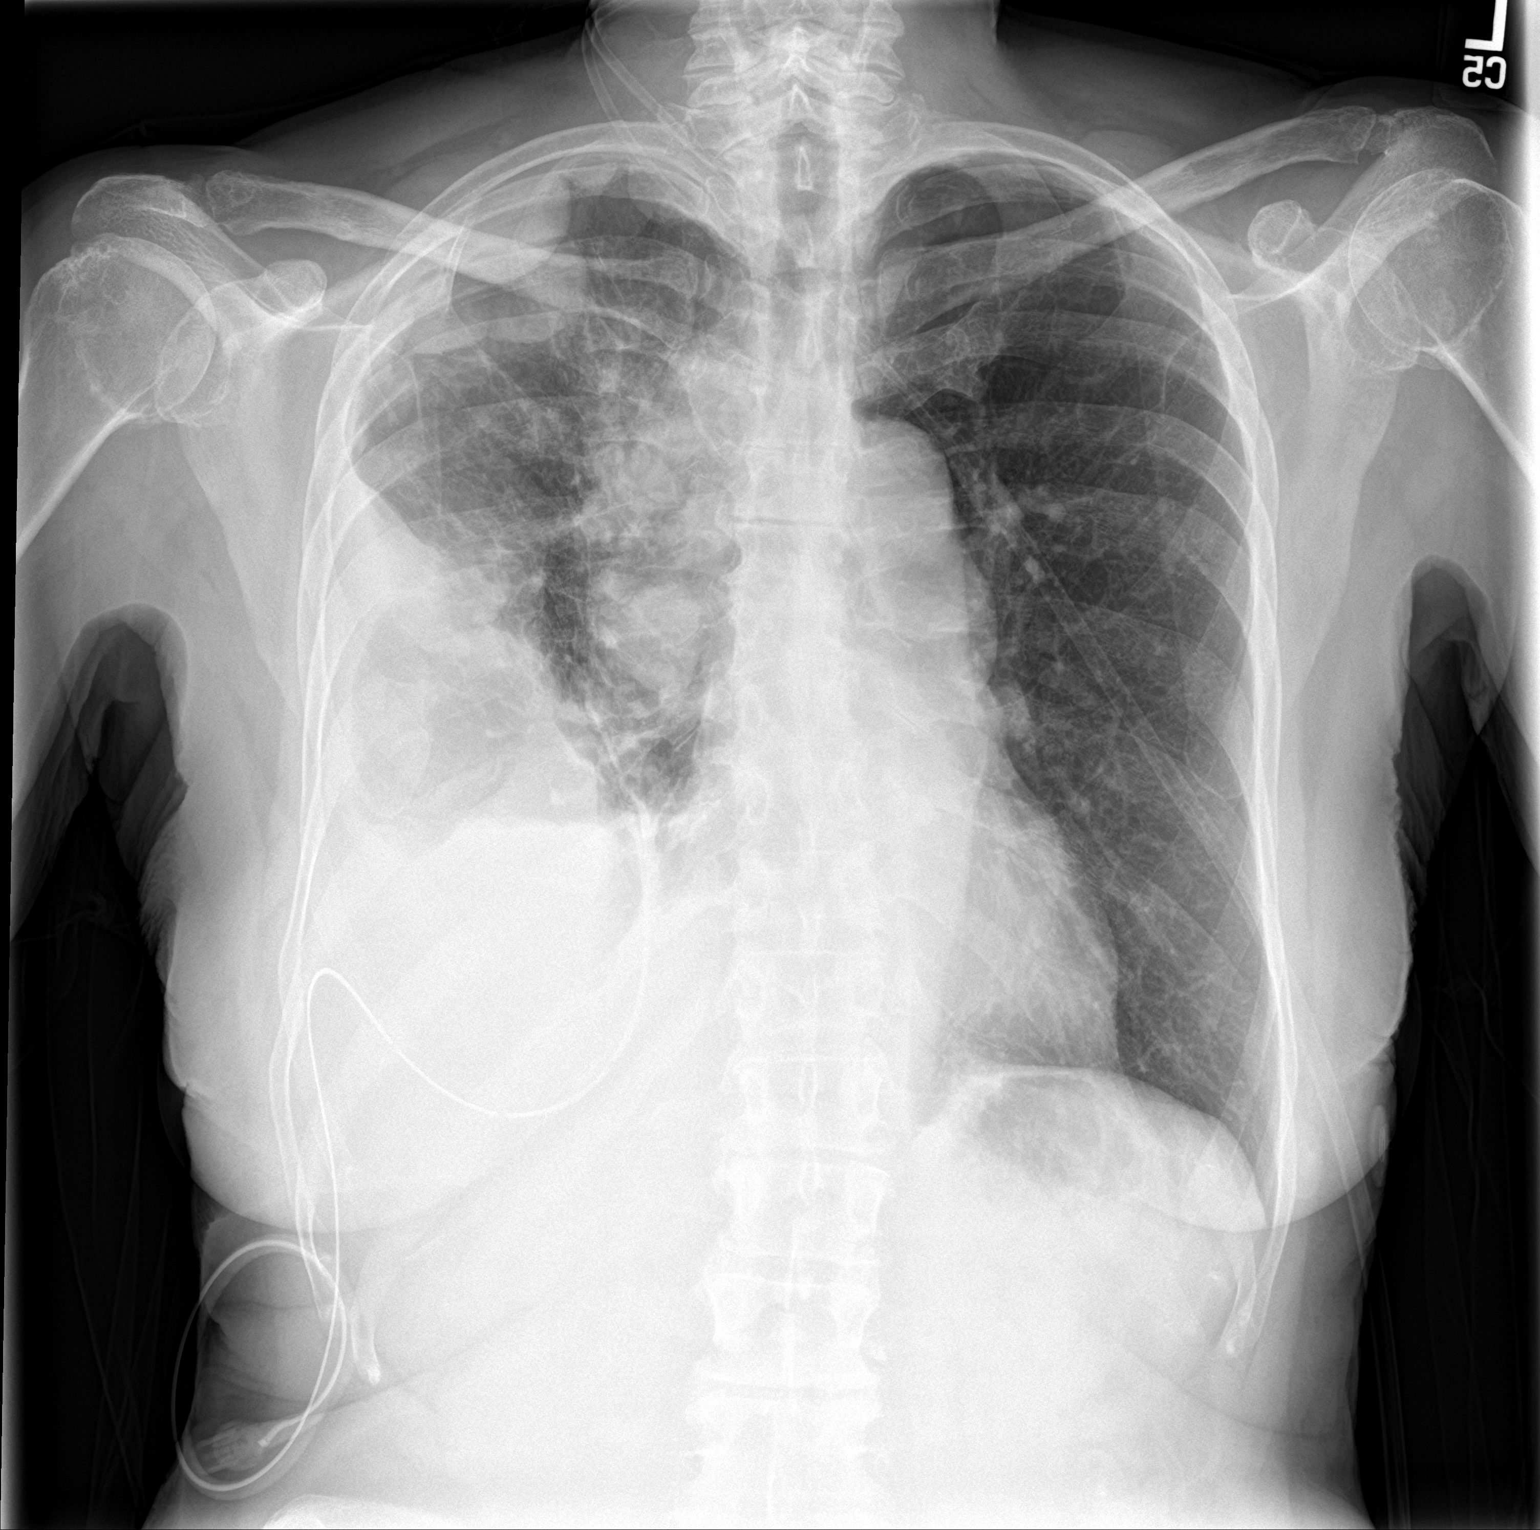

[chest lat]
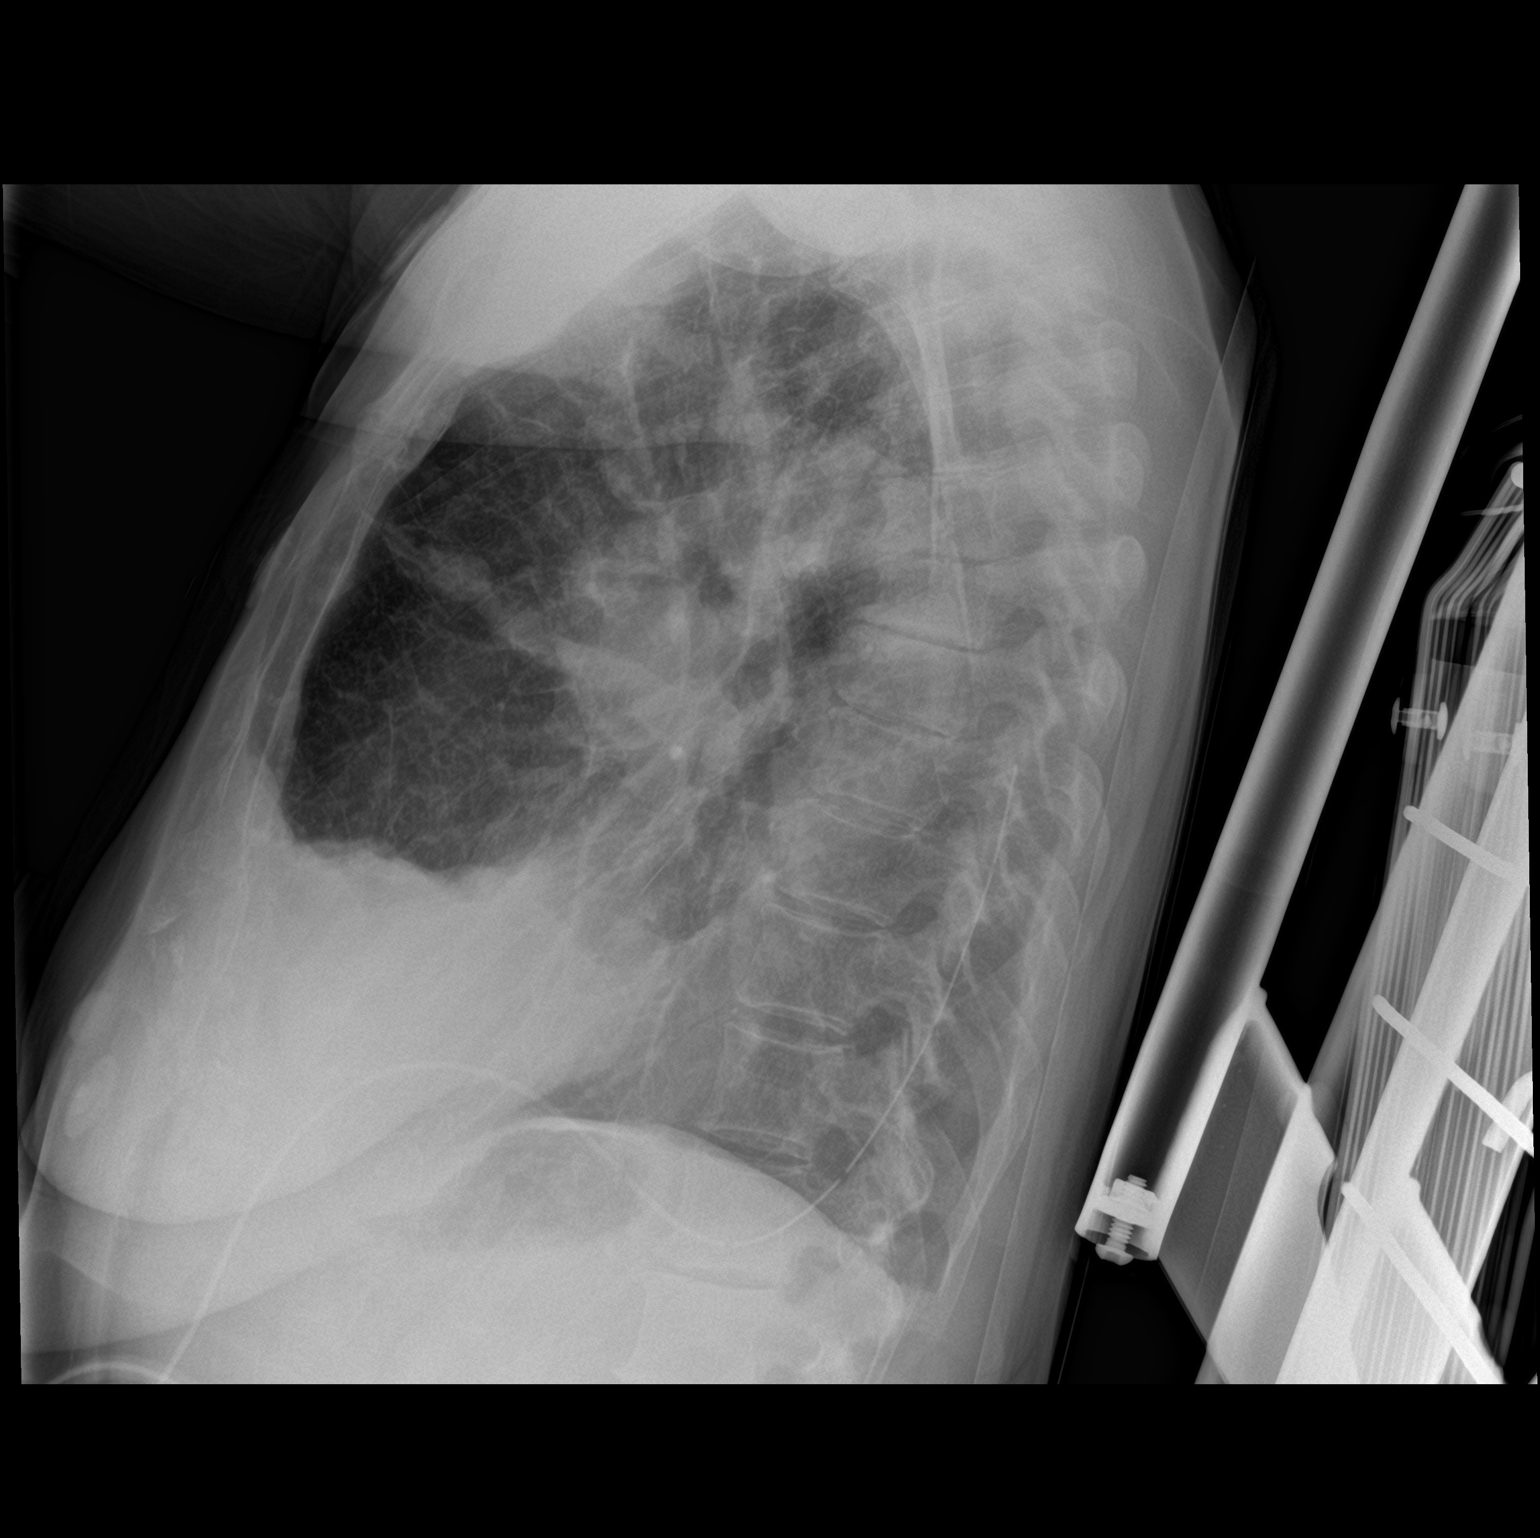

[2 of 2 positions shown; findings below may reference images not displayed]

FINDINGS: Heart size remains normal. Left chest tube remains in place.
Moderate right pleural effusion is increased in size since previous
study. No pneumothorax visualized. Multiple base masses are again
seen in the right lung apex, without significant change. Mediastinal
and right hilar lymphadenopathy remains stable. Left lung remains
clear.
IMPRESSION: Moderate right pleural effusion, increased in size since prior
study. Right chest tube remains in place.

Multiple pleural base masses in right lung apex, without significant
change.

No significant change in mediastinal and right hilar
lymphadenopathy.

## 2020-06-05 IMAGING — DX DG CHEST 2V
2 series · 2 of 2 positions shown · non-contrast
Comparison: Chest radiograph dated 04/15/2019 and CT dated
04/15/2019

CLINICAL DATA: 68-year-old female with shortness of breath.

EXAM:
CHEST - 2 VIEW

[chest lat]
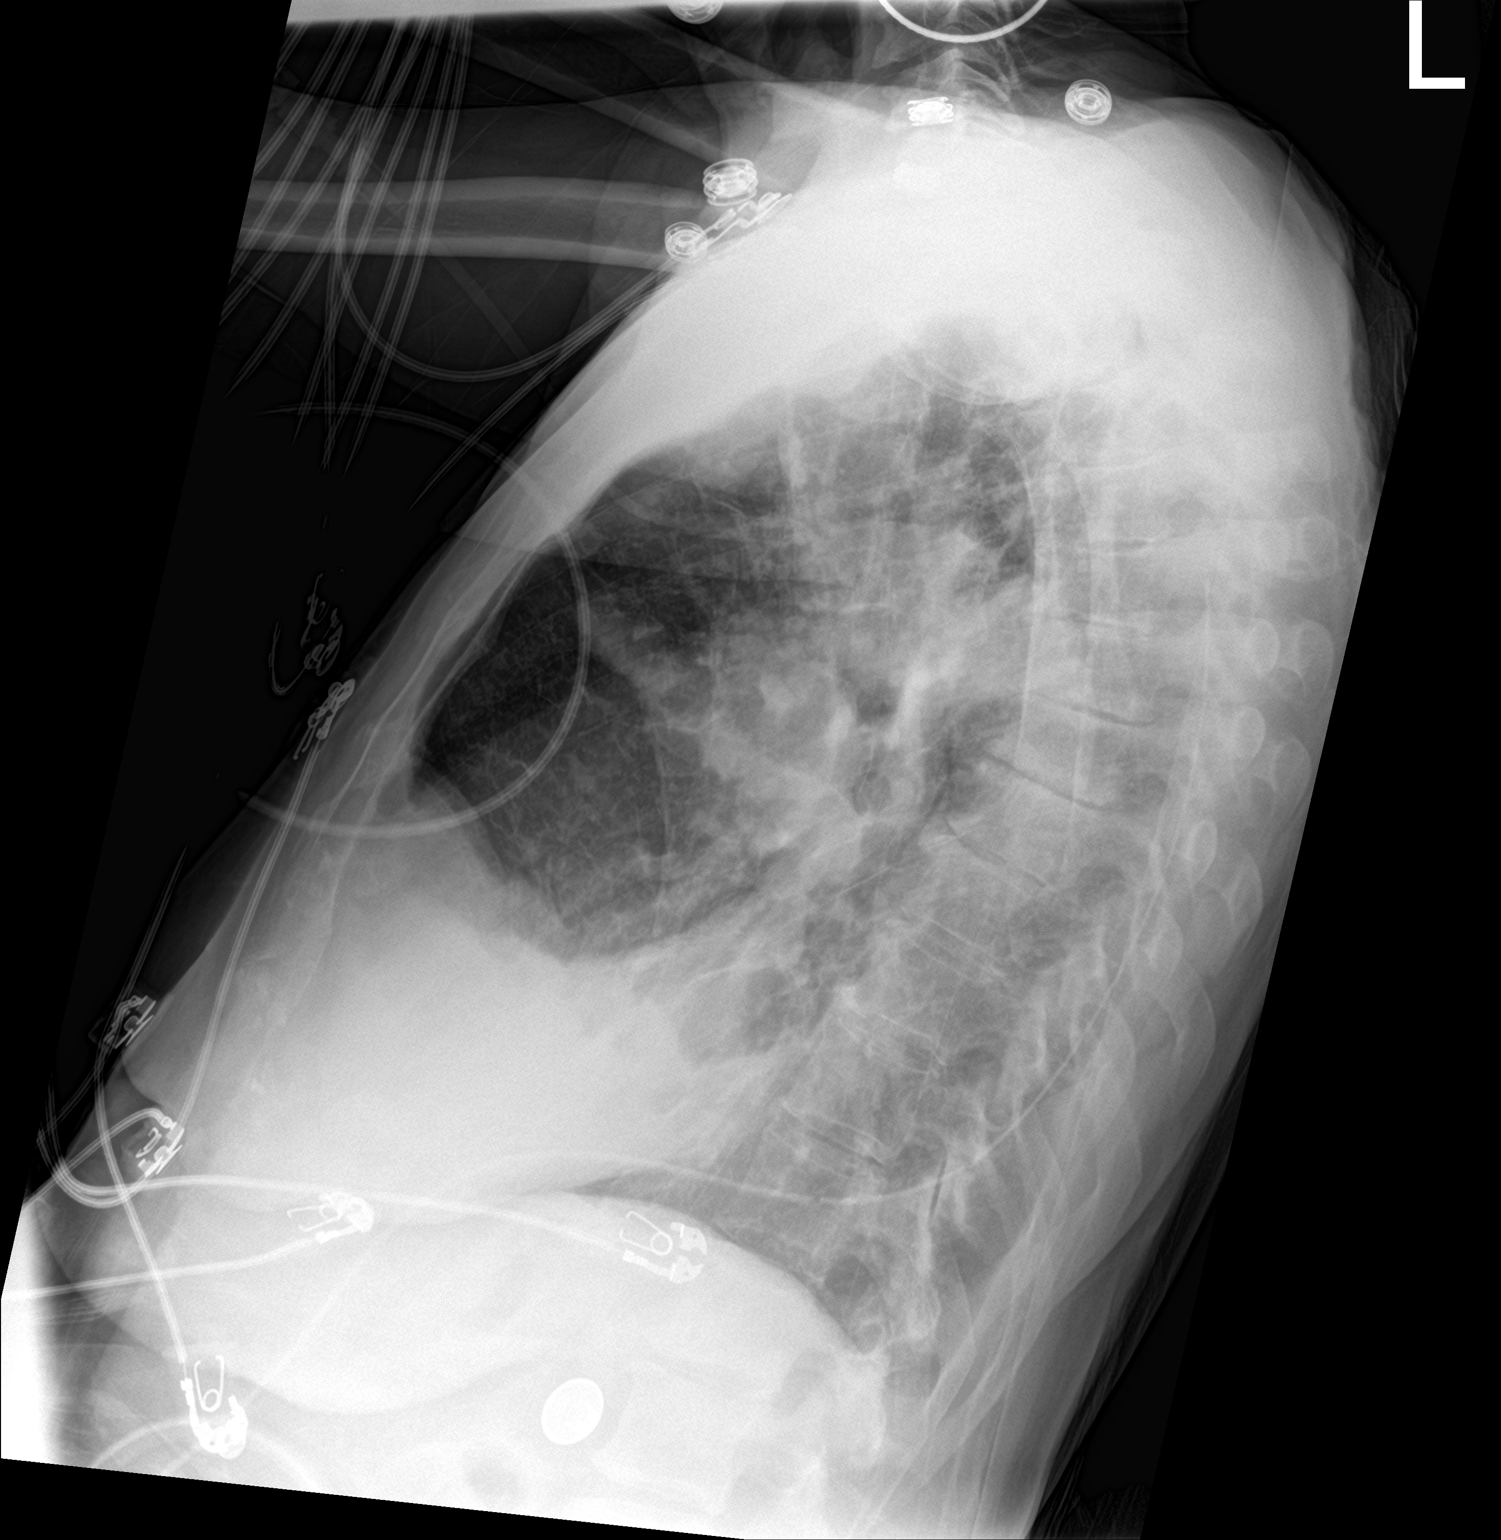

[chest ap]
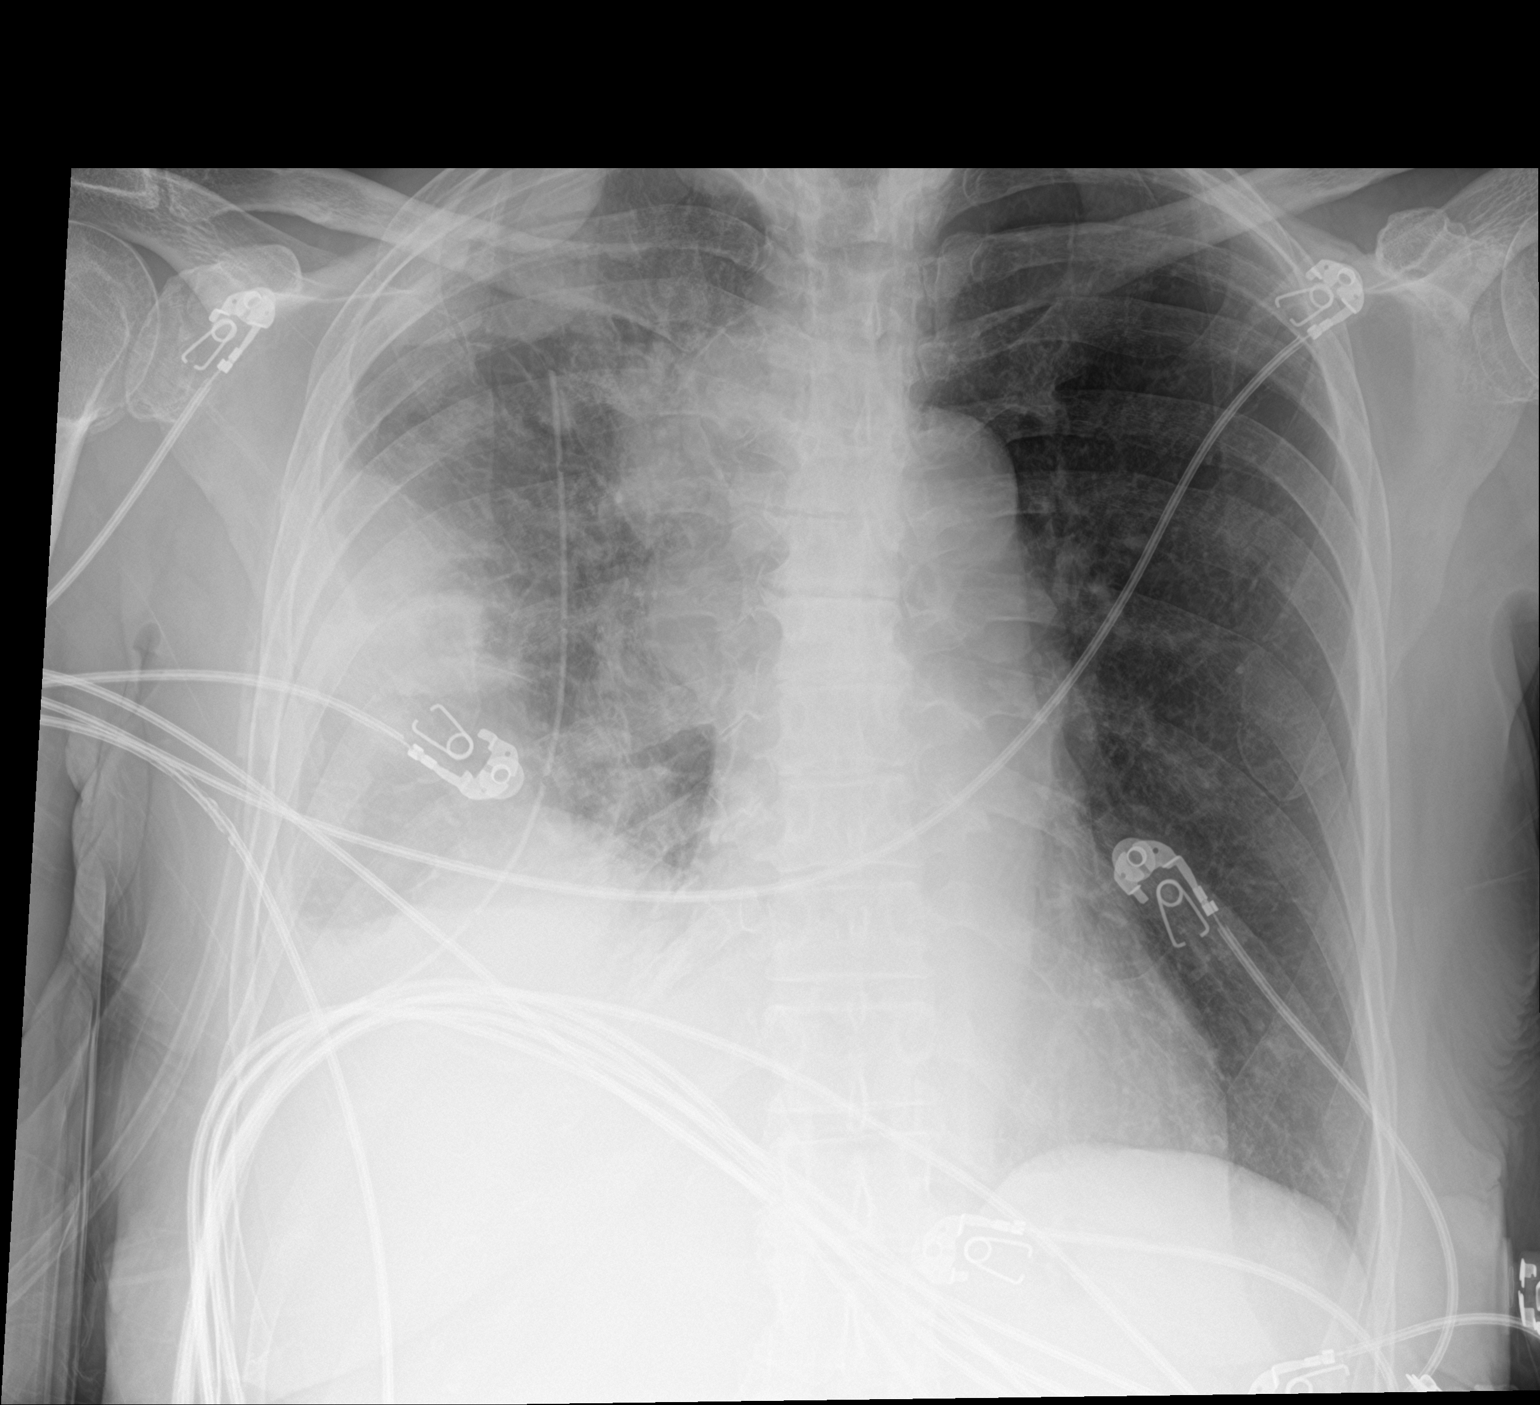

[2 of 2 positions shown; findings below may reference images not displayed]

FINDINGS: A right-sided chest tube appears to have been advanced with tip now
over the posterior right upper lobe pleural surface.

Loculated pleural effusion and right apical pleural based loculated
effusion or masses as well as right hilar mass. Overall no
significant interval change aeration of the right lung since the
radiograph of 04/25/2019. The left lung remains clear. No
pneumothorax. Stable cardiomediastinal silhouette. No acute osseous
pathology.
IMPRESSION: No significant interval change in the appearance of the loculated
right pleural effusion and right hilar mass. No pneumothorax.

## 2020-06-06 IMAGING — CT CT CHEST W/O CM
2 of 4 series · 15 of 36 positions shown, 18 images · non-contrast
Comparison: Chest radiograph dated 04/23/2019 and CT dated
04/15/2019

CLINICAL DATA: 68-year-old female with pleural effusion. Recent
diagnosis of stage IV lung cancer.

EXAM:
CT CHEST WITHOUT CONTRAST
TECHNIQUE: Multidetector CT imaging of the chest was performed following the
standard protocol without IV contrast.

[Series 4: thorax 2.0 · axial · 0.79mm/px · z∈[+1083,+1383]mm · 12 of 168 slices shown, 15 images]
[im 9/168  mediastinal]
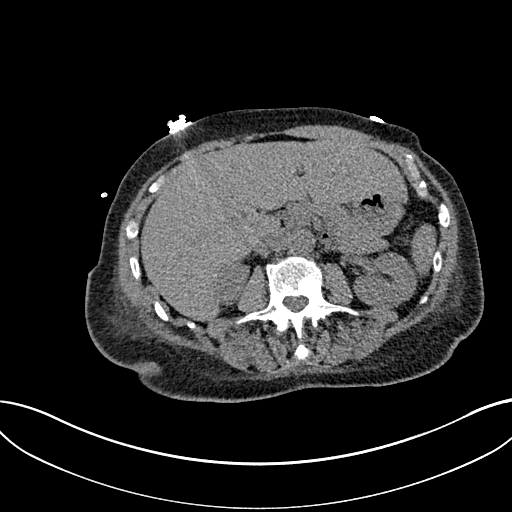
[im 9/168  lung]
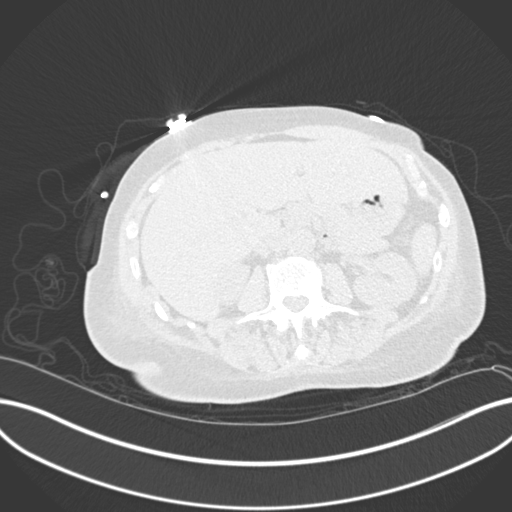
[im 27/168  lung]
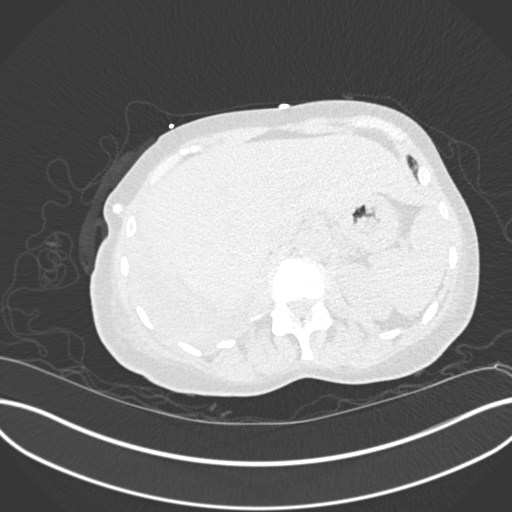
[im 36/168  lung]
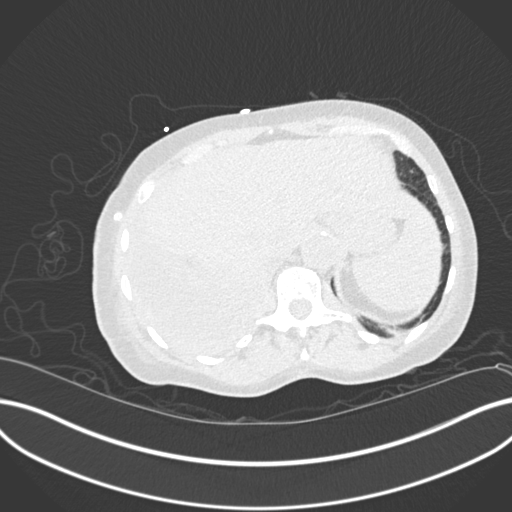
[im 53/168  lung]
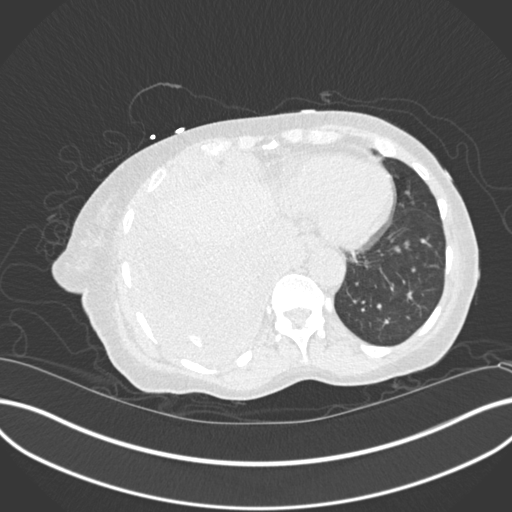
[im 62/168  mediastinal]
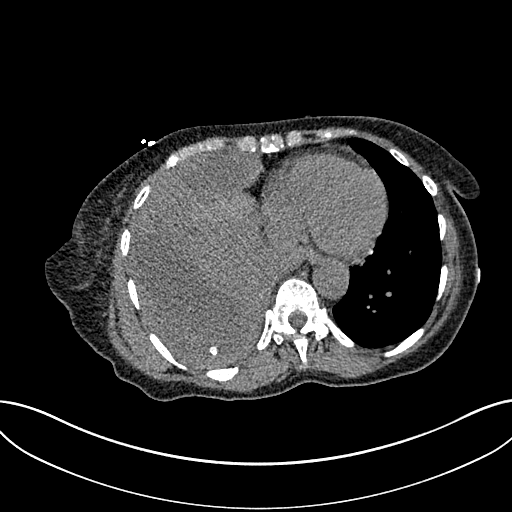
[im 62/168  lung]
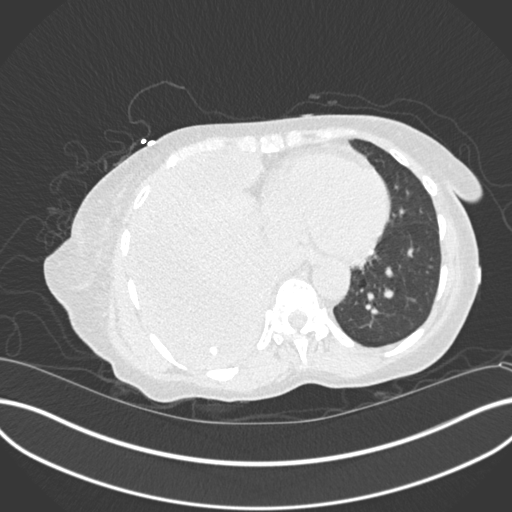
[im 80/168  lung]
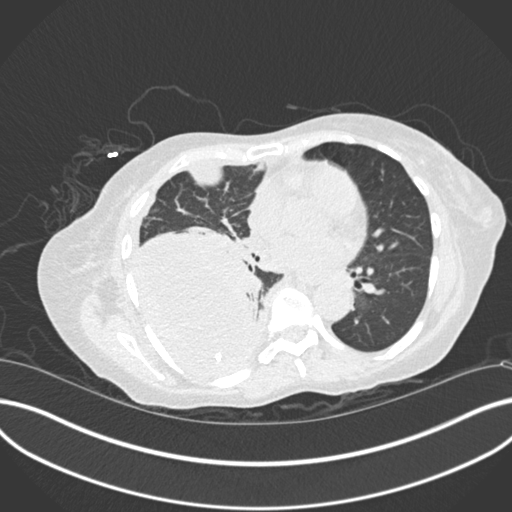
[im 88/168  lung]
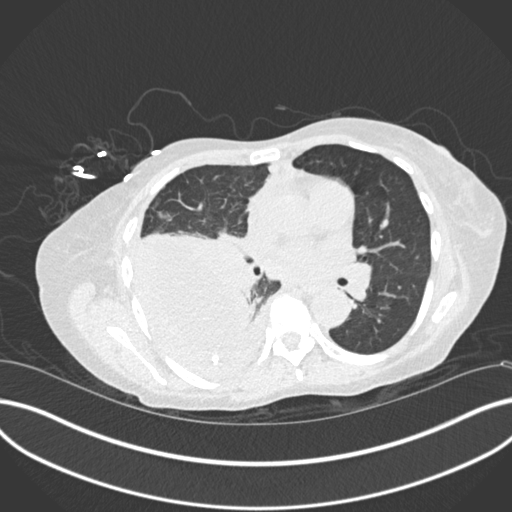
[im 106/168  lung]
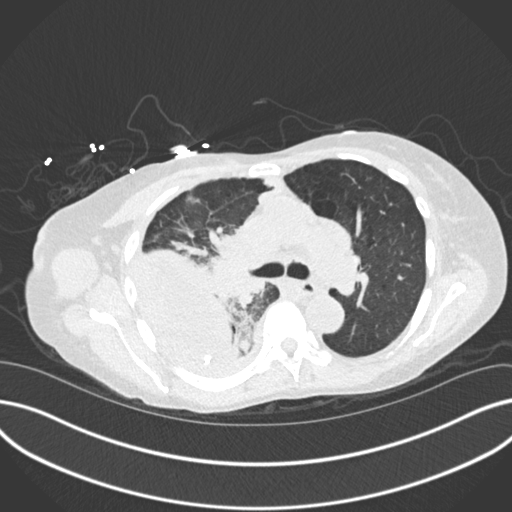
[im 115/168  mediastinal]
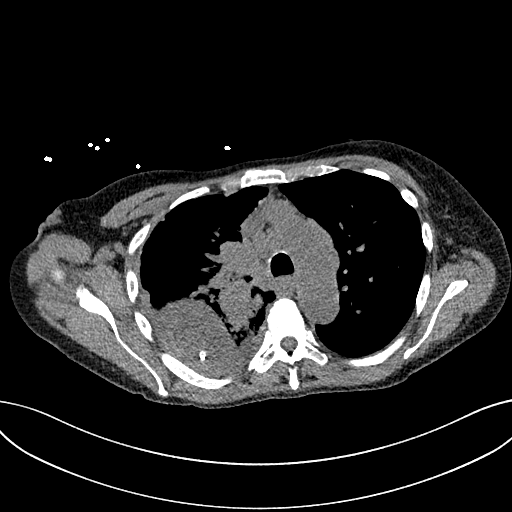
[im 115/168  lung]
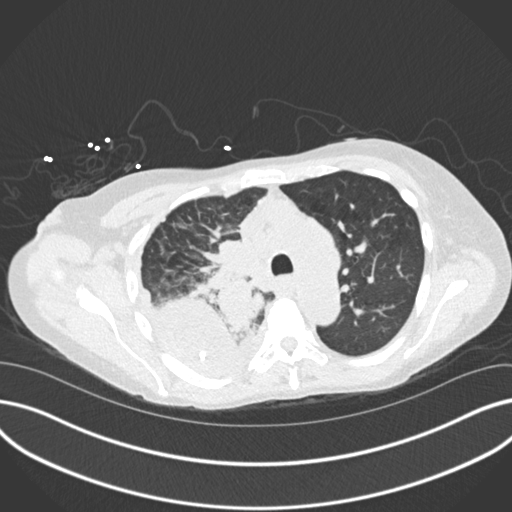
[im 132/168  lung]
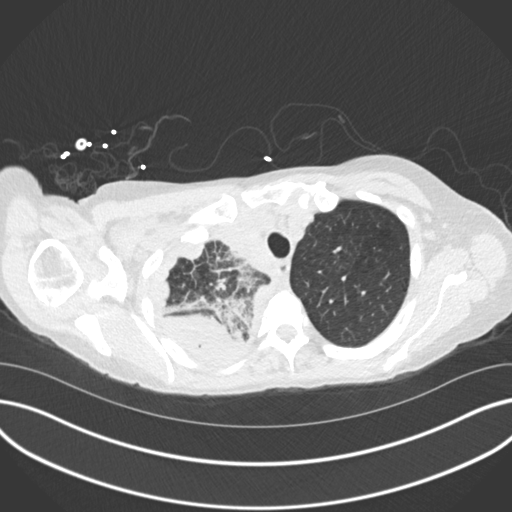
[im 141/168  lung]
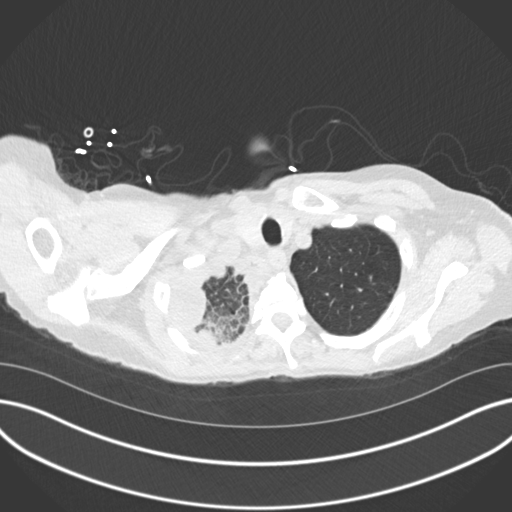
[im 159/168  lung]
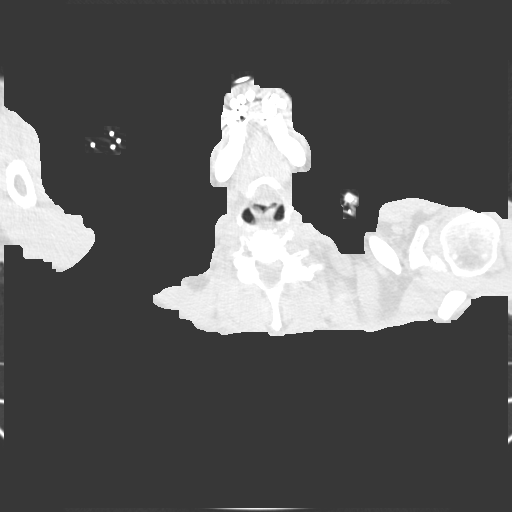

[Series 6: coronal · coronal · 0.65mm/px · 3 of 86 slices shown]
[im 18/86  lung]
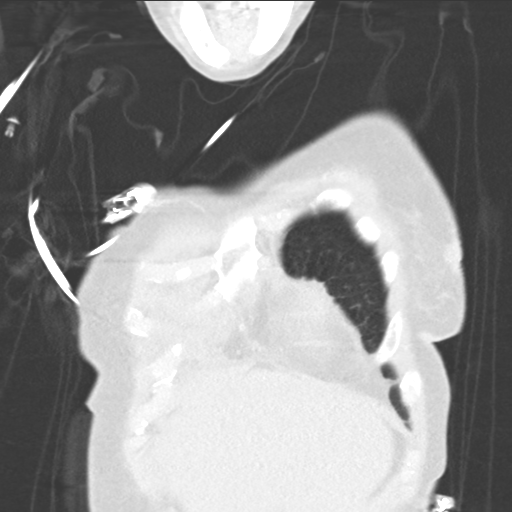
[im 35/86  lung]
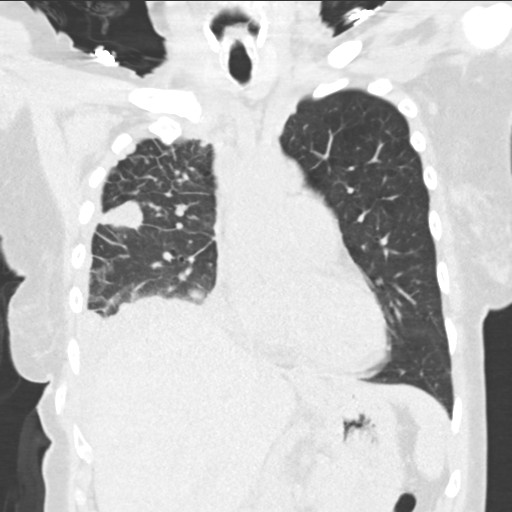
[im 52/86  lung]
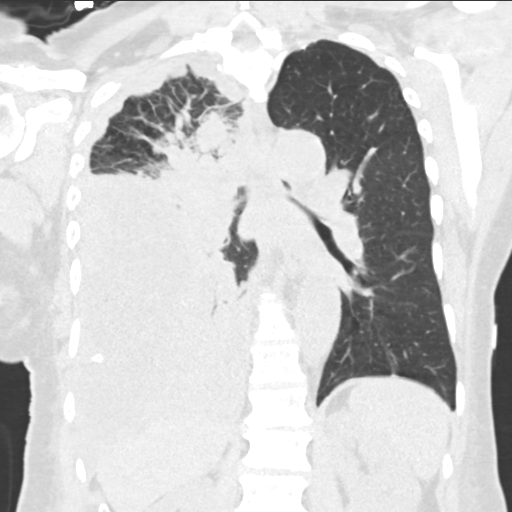

[15 of 36 positions shown; findings below may reference images not displayed]

FINDINGS: Evaluation of this exam is limited in the absence of intravenous
contrast.

Cardiovascular: There is no cardiomegaly or pericardial effusion.
The thoracic aorta and central pulmonary arteries are grossly
unremarkable on this noncontrast CT.

Mediastinum/Nodes: Right hilar/suprahilar mass measures
approximately 6.9 x 3.9 cm in greatest axial dimensions in the
suprahilar region and 4.6 x 3.7 cm along the right aspect of the
upper mediastinum. This is similar to prior CT. No mediastinal fluid
collection.

Lungs/Pleura: Right-sided pleural catheter with tip in the posterior
right upper lobe pleural surface, slightly more superior compared to
the prior CT. No significant interval change in the lobulated
pleural based low attenuating masses/collections since the prior CT.
These likely represent combination of pleural implants and loculated
effusions. There is diffuse interstitial prominence and nodularity
in the right upper lobe concerning for metastatic disease and
lymphangitic spread of tumor. There is associated mass effect and
compressive atelectasis of the majority of the right lower lobe
similar to prior CT. There is a 5 mm nodule in the left lower lobe.
There is no pneumothorax. There is occlusion of the right upper lobe
bronchus by the suprahilar mass. There is compression of the
bronchus intermedius and right middle and right lower lobe bronchi.

Upper Abdomen: No acute abnormality.

Musculoskeletal: Multilevel degenerative changes of the spine. No
acute osseous pathology.
IMPRESSION: 1. No significant interval change in the size or appearance of the
right hilar/suprahilar mass compared to the prior CT.
2. No interval change in the size or appearance of the pleural based
masses and loculated collections since the prior CT.
3. Right-sided pleural catheter with tip in the posterior right
upper lobe pleural surface, slightly more superior compared to the
prior CT.
4. Diffuse interstitial prominence and nodularity in the right upper
lobe concerning for metastatic disease and lymphangitic spread of
tumor.
5. Stable compressive atelectasis of the majority of the right lower
lobe.
6. A 5 mm left lower lobe pulmonary nodule.
